# Patient Record
Sex: Female | Born: 1955 | Race: White | Hispanic: No | Marital: Married | Smoking: Never smoker
Health system: Southern US, Community
[De-identification: ages and names within clinical notes are randomized; demographics above are authoritative.]

## PROBLEM LIST (undated history)

## (undated) DIAGNOSIS — R112 Nausea with vomiting, unspecified: Secondary | ICD-10-CM

## (undated) DIAGNOSIS — G4730 Sleep apnea, unspecified: Secondary | ICD-10-CM

## (undated) DIAGNOSIS — H53469 Homonymous bilateral field defects, unspecified side: Secondary | ICD-10-CM

## (undated) DIAGNOSIS — E119 Type 2 diabetes mellitus without complications: Secondary | ICD-10-CM

## (undated) DIAGNOSIS — K219 Gastro-esophageal reflux disease without esophagitis: Secondary | ICD-10-CM

## (undated) DIAGNOSIS — M797 Fibromyalgia: Secondary | ICD-10-CM

## (undated) DIAGNOSIS — G4709 Other insomnia: Secondary | ICD-10-CM

## (undated) DIAGNOSIS — Z9889 Other specified postprocedural states: Secondary | ICD-10-CM

## (undated) DIAGNOSIS — C801 Malignant (primary) neoplasm, unspecified: Secondary | ICD-10-CM

## (undated) DIAGNOSIS — E785 Hyperlipidemia, unspecified: Secondary | ICD-10-CM

## (undated) DIAGNOSIS — R51 Headache: Secondary | ICD-10-CM

## (undated) DIAGNOSIS — J302 Other seasonal allergic rhinitis: Secondary | ICD-10-CM

## (undated) DIAGNOSIS — E039 Hypothyroidism, unspecified: Secondary | ICD-10-CM

## (undated) DIAGNOSIS — I10 Essential (primary) hypertension: Secondary | ICD-10-CM

## (undated) DIAGNOSIS — I471 Supraventricular tachycardia: Secondary | ICD-10-CM

## (undated) DIAGNOSIS — M542 Cervicalgia: Secondary | ICD-10-CM

## (undated) DIAGNOSIS — R7303 Prediabetes: Secondary | ICD-10-CM

## (undated) DIAGNOSIS — I639 Cerebral infarction, unspecified: Secondary | ICD-10-CM

## (undated) DIAGNOSIS — R002 Palpitations: Secondary | ICD-10-CM

## (undated) DIAGNOSIS — J189 Pneumonia, unspecified organism: Secondary | ICD-10-CM

## (undated) DIAGNOSIS — I69398 Other sequelae of cerebral infarction: Secondary | ICD-10-CM

## (undated) DIAGNOSIS — E559 Vitamin D deficiency, unspecified: Secondary | ICD-10-CM

## (undated) DIAGNOSIS — E063 Autoimmune thyroiditis: Secondary | ICD-10-CM

## (undated) HISTORY — DX: Autoimmune thyroiditis: E06.3

## (undated) HISTORY — PX: DILATION AND CURETTAGE OF UTERUS: SHX78

## (undated) HISTORY — DX: Cervicalgia: M54.2

## (undated) HISTORY — PX: OTHER SURGICAL HISTORY: SHX169

## (undated) HISTORY — PX: BACK SURGERY: SHX140

## (undated) HISTORY — DX: Prediabetes: R73.03

## (undated) HISTORY — DX: Homonymous bilateral field defects, unspecified side: H53.469

## (undated) HISTORY — DX: Vitamin D deficiency, unspecified: E55.9

## (undated) HISTORY — DX: Cerebral infarction, unspecified: I63.9

## (undated) HISTORY — DX: Other sequelae of cerebral infarction: I69.398

## (undated) HISTORY — DX: Palpitations: R00.2

## (undated) HISTORY — DX: Other insomnia: G47.09

## (undated) HISTORY — PX: TUBAL LIGATION: SHX77

## (undated) HISTORY — DX: Supraventricular tachycardia: I47.1

## (undated) HISTORY — PX: ABDOMINAL HYSTERECTOMY: SHX81

## (undated) HISTORY — DX: Hyperlipidemia, unspecified: E78.5

---
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
## 1988-03-04 HISTORY — PX: TUBAL LIGATION: SHX77

## 1992-03-04 LAB — HM PAP SMEAR

## 1993-03-04 HISTORY — PX: ABDOMINAL HYSTERECTOMY: SHX81

## 2003-09-07 ENCOUNTER — Other Ambulatory Visit: Admission: RE | Admit: 2003-09-07 | Discharge: 2003-09-07 | Payer: Self-pay | Admitting: Family Medicine

## 2004-03-04 HISTORY — PX: THYROIDECTOMY: SHX17

## 2004-04-16 ENCOUNTER — Ambulatory Visit: Payer: Self-pay | Admitting: Family Medicine

## 2004-11-08 ENCOUNTER — Ambulatory Visit: Payer: Self-pay | Admitting: Family Medicine

## 2005-03-05 ENCOUNTER — Ambulatory Visit: Payer: Self-pay | Admitting: Family Medicine

## 2005-03-05 ENCOUNTER — Other Ambulatory Visit: Admission: RE | Admit: 2005-03-05 | Discharge: 2005-03-05 | Payer: Self-pay | Admitting: Family Medicine

## 2005-05-02 ENCOUNTER — Ambulatory Visit: Payer: Self-pay | Admitting: Family Medicine

## 2012-07-02 HISTORY — PX: LUMBAR DISC SURGERY: SHX700

## 2012-10-03 HISTORY — PX: LUMBAR DISC SURGERY: SHX700

## 2012-10-20 ENCOUNTER — Emergency Department (HOSPITAL_COMMUNITY)
Admission: EM | Admit: 2012-10-20 | Discharge: 2012-10-20 | Disposition: A | Discharge status: Home / Self Care | Payer: BC Managed Care – PPO | Attending: Emergency Medicine | Admitting: Emergency Medicine

## 2012-10-20 ENCOUNTER — Encounter (HOSPITAL_COMMUNITY): Payer: Self-pay | Admitting: Nurse Practitioner

## 2012-10-20 ENCOUNTER — Emergency Department (HOSPITAL_COMMUNITY): Payer: BC Managed Care – PPO

## 2012-10-20 DIAGNOSIS — E039 Hypothyroidism, unspecified: Secondary | ICD-10-CM | POA: Insufficient documentation

## 2012-10-20 DIAGNOSIS — M6281 Muscle weakness (generalized): Secondary | ICD-10-CM | POA: Insufficient documentation

## 2012-10-20 DIAGNOSIS — M5416 Radiculopathy, lumbar region: Secondary | ICD-10-CM

## 2012-10-20 DIAGNOSIS — Y9289 Other specified places as the place of occurrence of the external cause: Secondary | ICD-10-CM | POA: Insufficient documentation

## 2012-10-20 DIAGNOSIS — Z9889 Other specified postprocedural states: Secondary | ICD-10-CM | POA: Insufficient documentation

## 2012-10-20 DIAGNOSIS — Z79899 Other long term (current) drug therapy: Secondary | ICD-10-CM | POA: Insufficient documentation

## 2012-10-20 DIAGNOSIS — IMO0002 Reserved for concepts with insufficient information to code with codable children: Secondary | ICD-10-CM | POA: Insufficient documentation

## 2012-10-20 DIAGNOSIS — I10 Essential (primary) hypertension: Secondary | ICD-10-CM | POA: Insufficient documentation

## 2012-10-20 DIAGNOSIS — Z791 Long term (current) use of non-steroidal anti-inflammatories (NSAID): Secondary | ICD-10-CM | POA: Insufficient documentation

## 2012-10-20 DIAGNOSIS — X500XXA Overexertion from strenuous movement or load, initial encounter: Secondary | ICD-10-CM | POA: Insufficient documentation

## 2012-10-20 DIAGNOSIS — Y9389 Activity, other specified: Secondary | ICD-10-CM | POA: Insufficient documentation

## 2012-10-20 HISTORY — DX: Essential (primary) hypertension: I10

## 2012-10-20 HISTORY — DX: Hypothyroidism, unspecified: E03.9

## 2012-10-20 MED ORDER — HYDROMORPHONE HCL PF 2 MG/ML IJ SOLN
2.0000 mg | Freq: Once | INTRAMUSCULAR | Status: AC
  Administered 2012-10-20: 2 mg via INTRAMUSCULAR
  Filled 2012-10-20: qty 1

## 2012-10-20 MED ORDER — OXYCODONE-ACETAMINOPHEN 7.5-325 MG PO TABS: 1.0000 | ORAL_TABLET | ORAL | Status: DC | PRN

## 2012-10-20 MED ORDER — ONDANSETRON 4 MG PO TBDP
8.0000 mg | ORAL_TABLET | Freq: Once | ORAL | Status: AC
  Administered 2012-10-20: 8 mg via ORAL
  Filled 2012-10-20: qty 2

## 2012-10-20 NOTE — ED Provider Notes (Signed)
Medical screening examination/treatment/procedure(s) were performed by non-physician practitioner and as supervising physician I was immediately available for consultation/collaboration.  Flint Melter, MD 10/20/12 2224

## 2012-10-20 NOTE — ED Provider Notes (Signed)
CSN: [id number]     Arrival date & time 10/20/12  1141 History     First MD Initiated Contact with Patient 10/20/12 1246     Chief Complaint  Patient presents with  . Back Pain   (Consider location/radiation/quality/duration/timing/severity/associated sxs/prior Treatment) HPI Comments: Patient has had two lumbar surgeries 07/24/12 and 10/02/12 by Dr Danielle Dess, reports two days ago she bent forward and heard a pop.  Since then has had pain worse than before her surgeries.  She has been in contact with Dr Verlee Rossetti office and per their instructions has taken her pain medications more frequently.  States she is unable to walk, has tried using a walker but it does not help. Her husband has been carrying her everywhere.  Has weakness in legs, but thinks it is pain related.  No numbness.  No fevers, chills, bowel or bladder incontiencne,  Abdominal pain, bowel, urinary, or vaginal complaints.   Patient is a 57 y.o. female presenting with back pain. The history is provided by the patient and the spouse.  Back Pain Associated symptoms: no abdominal pain, no chest pain, no dysuria and no fever     Past Medical History  Diagnosis Date  . Hypertension   . Hypothyroid    Past Surgical History  Procedure Laterality Date  . Back surgery    . Abdominal hysterectomy    . Lumbar disc surgery  10/03/2012    Dr Danielle Dess   History reviewed. No pertinent family history. History  Substance Use Topics  . Smoking status: Never Smoker   . Smokeless tobacco: Not on file  . Alcohol Use: No   OB History   Grav Para Term Preterm Abortions TAB SAB Ect Mult Living                 Review of Systems  Constitutional: Negative for fever.  Respiratory: Negative for cough and shortness of breath.   Cardiovascular: Negative for chest pain.  Gastrointestinal: Negative for nausea, vomiting, abdominal pain and diarrhea.  Genitourinary: Negative for dysuria, urgency, frequency, vaginal bleeding and vaginal discharge.    Musculoskeletal: Positive for back pain.    Allergies  Review of patient's allergies indicates no known allergies.  Home Medications   Current Outpatient Rx  Name  Route  Sig  Dispense  Refill  . cloNIDine (CATAPRES) 0.1 MG tablet   Oral   Take 0.1 mg by mouth 2 (two) times daily.         . diazepam (VALIUM) 5 MG tablet   Oral   Take 5 mg by mouth every 6 (six) hours as needed for anxiety.         Marland Kitchen levothyroxine (SYNTHROID, LEVOTHROID) 150 MCG tablet   Oral   Take 150 mcg by mouth daily before breakfast.         . meloxicam (MOBIC) 7.5 MG tablet   Oral   Take 7.5 mg by mouth 2 (two) times daily.         Marland Kitchen oxyCODONE-acetaminophen (PERCOCET/ROXICET) 5-325 MG per tablet   Oral   Take 1-2 tablets by mouth every 4 (four) hours as needed for pain.         . promethazine (PHENERGAN) 25 MG tablet   Oral   Take 25 mg by mouth every 6 (six) hours as needed for nausea.          BP 133/96  Pulse 97  Temp(Src) 97.9 F (36.6 C) (Oral)  Resp 20  SpO2 96% Physical Exam  Nursing  note and vitals reviewed. Constitutional: She appears well-developed and well-nourished. No distress.  HENT:  Head: Normocephalic and atraumatic.  Neck: Neck supple.  Pulmonary/Chest: Effort normal.  Abdominal: Soft. Bowel sounds are normal. She exhibits no distension. There is no tenderness. There is no rebound and no guarding.  Musculoskeletal:       Back:  Lower extremities:  Strength 5/5 on right, 4/5 on left, sensation intact, distal pulses intact.     Neurological: She is alert.  Skin: She is not diaphoretic.    ED Course   Procedures (including critical care time)  Labs Reviewed - No data to display Mr Lumbar Spine Wo Contrast  10/20/2012   CLINICAL DATA:  57 year old female with acute onset severe low back pain. Two prior back surgeries. Unable to walk.  EXAM: MRI LUMBAR SPINE WITHOUT CONTRAST  TECHNIQUE: Multiplanar, multisequence MR imaging was performed. No intravenous  contrast was administered.  COMPARISON:  [facility] lumbar MRI 09/03/2012, 06/04/2012, and [facility] lumbar MRI 03/14/2012.  FINDINGS: Same numbering system as on 09/03/2012.  Stable vertebral height and alignment. No marrow edema or evidence of acute osseous abnormality.  Postoperative changes to the paraspinal soft tissues most pronounced on the left at the L2-L3 level. See additional postoperative details below. Negative visualized abdominal visceral.  Visualized lower thoracic spinal cord is normal with conus medularis at L1-L2.  T11-12: Negative.  T12-L1: Negative.  L1-L2: Mild to moderate circumferential disk bulge has not significantly changed. Mild spinal stenosis at this level has not significantly changed. No foraminal stenosis.  L2-L3: Interval postoperative changes on the left with increased laminectomy. There is a T2 hyperintense probable fluid collection at the laminectomy site which does exert mild mass effect on the thecal sac, and along the course of the descending left L3 nerve roots (series 6, image 12). Partial discectomy at this site, the left paracentral disc extrusion is no longer visible. Underlying circumferential disk bulge again noted. Mild right facet and ligament flavum hypertrophy. Trace right facet joint fluid. Overall thecal sac patency at this level is mildly improved. Left lateral recess patency also improved. No definite foraminal stenosis.  L3-L4: Stable circumferential disk bulge with broad-based posterior component that interval decreased central annular tear high signal. Mild facet and ligament flavum hypertrophy is stable. Mild to moderate spinal stenosis not significantly changed. No foraminal stenosis. Trace facet joint fluid.  L4-L5: Left eccentric disc bulge is stable. Moderate facet and ligament flavum hypertrophy is stable. Stable mild left lateral recess stenosis without significant spinal stenosis. Trace fluid in both facet joints this level.  Borderline to mild left L4 foraminal stenosis is stable.  L5-S1: Stable and negative.  IMPRESSION: 1. Interval postoperative changes on the left at L2-L3. No residual or recurrent disk herniation. There is a small fluid collection in the laminectomy space which exerts mild mass effect on the course of the descending left L3 nerve roots, but left lateral recess and overall thecal sac patency here appears improved.  2. No significant progression of other lumbar levels, with mild to moderate multifactorial spinal stenosis at L1-L2 and L3-L4. Annular tear at L3-L4 appears regressed. Stable mild left lateral recess and left foraminal stenosis at L4-L5.   Electronically Signed   By: Augusto Gamble   On: 10/20/2012 15:49    4:17 PM Discussed patient with Dr Effie Shy, who also reviewed pt's MRI.  Patient ambulated after pain medication without difficulty (rated back pain 3/10 at the time), she is able to move left leg easily now  with pain control.   4:50 PM Discussed patient with Dr Jordan Likes who has reviewed MRI.  Dr Jordan Likes states these are normal postoperative changes and he agrees with increasing pain meds and d/c home with Dr Danielle Dess follow up.   1. Pain, radicular, lumbar     MDM  Pt with chronic low back pain with two recent surgeries by Dr Danielle Dess, p/w new injury to low back with slight flexion of spine two days ago.  Pain uncontrolled, unable to walk at home.  Pain controlled here and patient able to ambulate in hallway, able to move left leg more easily.  MRI ordered given initial presentation and recent surgery.  Per Dr Jordan Likes, fluid collection appears to be normal postoperative change.  She is afebrile, nontoxic, much more comfortable after meds.  Pt d/c home with neurosurgery ([facility]) follow up, increasing her home pain meds.  Discussed all results with patient.  Pt given return precautions.  Patient is currently sleepy from pain medication, husband is beside and all results and plan discussed with him.  He expresses  understanding and agrees with plan.     Trixie Dredge, PA-C 10/20/12 1722

## 2012-10-20 NOTE — ED Notes (Addendum)
Pt states she leaned over Sunday and felt a "pop" in her lower back, shes been having severe lower back pain since. Pt had 2 back surgeries by dr elsner this summer. Pt states she can not walk due to pain. Denies bowel/bladder changes. Took percocet and diazepam at 1000 today with no relief

## 2013-01-08 ENCOUNTER — Other Ambulatory Visit: Payer: Self-pay | Admitting: Neurological Surgery

## 2013-01-08 ENCOUNTER — Other Ambulatory Visit (HOSPITAL_COMMUNITY): Payer: Self-pay | Admitting: Neurological Surgery

## 2013-01-08 DIAGNOSIS — IMO0002 Reserved for concepts with insufficient information to code with codable children: Secondary | ICD-10-CM

## 2013-01-14 ENCOUNTER — Encounter (HOSPITAL_COMMUNITY): Payer: Self-pay | Admitting: Pharmacy Technician

## 2013-01-18 ENCOUNTER — Encounter (HOSPITAL_COMMUNITY): Payer: Self-pay

## 2013-01-18 ENCOUNTER — Ambulatory Visit (HOSPITAL_COMMUNITY)
Admission: RE | Admit: 2013-01-18 | Discharge: 2013-01-18 | Disposition: A | Discharge status: Home / Self Care | Payer: BC Managed Care – PPO | Source: Ambulatory Visit | Attending: Neurological Surgery | Admitting: Neurological Surgery

## 2013-01-18 DIAGNOSIS — M5146 Schmorl's nodes, lumbar region: Secondary | ICD-10-CM | POA: Insufficient documentation

## 2013-01-18 DIAGNOSIS — M5126 Other intervertebral disc displacement, lumbar region: Secondary | ICD-10-CM | POA: Insufficient documentation

## 2013-01-18 DIAGNOSIS — IMO0002 Reserved for concepts with insufficient information to code with codable children: Secondary | ICD-10-CM

## 2013-01-18 DIAGNOSIS — M47817 Spondylosis without myelopathy or radiculopathy, lumbosacral region: Secondary | ICD-10-CM | POA: Insufficient documentation

## 2013-01-18 LAB — GLUCOSE, CAPILLARY
Glucose-Capillary: 119 mg/dL — ABNORMAL HIGH (ref 70–99)
Glucose-Capillary: 88 mg/dL (ref 70–99)

## 2013-01-18 MED ORDER — ONDANSETRON HCL 4 MG/2ML IJ SOLN: 4.0000 mg | Freq: Four times a day (QID) | INTRAMUSCULAR | Status: DC | PRN

## 2013-01-18 MED ORDER — DIAZEPAM 5 MG PO TABS
10.0000 mg | ORAL_TABLET | Freq: Once | ORAL | Status: AC
  Administered 2013-01-18: 10 mg via ORAL

## 2013-01-18 MED ORDER — DIAZEPAM 5 MG PO TABS
ORAL_TABLET | ORAL | Status: AC
  Filled 2013-01-18: qty 2

## 2013-01-18 MED ORDER — HYDROCODONE-ACETAMINOPHEN 5-325 MG PO TABS
1.0000 | ORAL_TABLET | ORAL | Status: DC | PRN
  Administered 2013-01-18: 1 via ORAL
  Filled 2013-01-18: qty 1

## 2013-01-18 MED ORDER — IOHEXOL 180 MG/ML  SOLN
20.0000 mL | Freq: Once | INTRAMUSCULAR | Status: AC | PRN
  Administered 2013-01-18: 12 mL via INTRATHECAL

## 2013-01-18 NOTE — Procedures (Addendum)
Caitlin Ho is a 57 year old individual is had 2 previous discectomies at L3-L4 these were done this past year. She has persistent left lumbar radiculopathy. She has evidence of some advanced spondylosis at the level of L3 L4-1 myelogram is now being performed to see if there is any gross instability or other pathology that can be responsible for her continued and persistent radicular symptoms.  Pre op Dx: Lumbar radiculopathy status post discectomy L3-4 left Post op Dx: Lumbar radiculopathy status post discectomy L3-4 left Procedure: Lumbar myelogram with standing flexion-extension views Surgeon: Bernadette Gores Puncture level: L2-3 Fluid color: Clear colorless Injection: Iohexol 180, 11 cc Findings: Mild spondylosis L3-4. For standing flexion-extension views plus CT scan of lumbar spine.

## 2013-01-22 ENCOUNTER — Other Ambulatory Visit: Payer: Self-pay | Admitting: Neurological Surgery

## 2013-02-09 ENCOUNTER — Encounter (HOSPITAL_COMMUNITY): Payer: Self-pay | Admitting: Pharmacy Technician

## 2013-02-11 ENCOUNTER — Encounter (HOSPITAL_COMMUNITY)
Admission: RE | Admit: 2013-02-11 | Discharge: 2013-02-11 | Disposition: A | Discharge status: Home / Self Care | Payer: BC Managed Care – PPO | Source: Ambulatory Visit | Attending: Neurological Surgery | Admitting: Neurological Surgery

## 2013-02-11 ENCOUNTER — Encounter (HOSPITAL_COMMUNITY): Payer: Self-pay

## 2013-02-11 ENCOUNTER — Ambulatory Visit (HOSPITAL_COMMUNITY)
Admission: RE | Admit: 2013-02-11 | Discharge: 2013-02-11 | Disposition: A | Discharge status: Home / Self Care | Payer: BC Managed Care – PPO | Source: Ambulatory Visit | Attending: Anesthesiology | Admitting: Anesthesiology

## 2013-02-11 DIAGNOSIS — I10 Essential (primary) hypertension: Secondary | ICD-10-CM | POA: Insufficient documentation

## 2013-02-11 DIAGNOSIS — Z01818 Encounter for other preprocedural examination: Secondary | ICD-10-CM | POA: Insufficient documentation

## 2013-02-11 DIAGNOSIS — Z01812 Encounter for preprocedural laboratory examination: Secondary | ICD-10-CM | POA: Insufficient documentation

## 2013-02-11 HISTORY — DX: Headache: R51

## 2013-02-11 HISTORY — DX: Other specified postprocedural states: Z98.890

## 2013-02-11 HISTORY — DX: Malignant (primary) neoplasm, unspecified: C80.1

## 2013-02-11 HISTORY — DX: Gastro-esophageal reflux disease without esophagitis: K21.9

## 2013-02-11 HISTORY — DX: Nausea with vomiting, unspecified: R11.2

## 2013-02-11 HISTORY — DX: Other specified postprocedural states: R11.2

## 2013-02-11 HISTORY — DX: Type 2 diabetes mellitus without complications: E11.9

## 2013-02-11 LAB — TYPE AND SCREEN
ABO/RH(D): O POS
Antibody Screen: NEGATIVE

## 2013-02-11 LAB — CBC
MCHC: 34.1 g/dL (ref 30.0–36.0)
MCV: 90.7 fL (ref 78.0–100.0)
RDW: 13 % (ref 11.5–15.5)

## 2013-02-11 LAB — SURGICAL PCR SCREEN: Staphylococcus aureus: NEGATIVE

## 2013-02-11 LAB — ABO/RH: ABO/RH(D): O POS

## 2013-02-11 LAB — BASIC METABOLIC PANEL
BUN: 11 mg/dL (ref 6–23)
Chloride: 100 mEq/L (ref 96–112)
Creatinine, Ser: 0.79 mg/dL (ref 0.50–1.10)
GFR calc Af Amer: >90 mL/min (ref >90)
GFR calc non Af Amer: >90 mL/min (ref >90)
Sodium: 140 mEq/L (ref 135–145)

## 2013-02-11 NOTE — Pre-Procedure Instructions (Signed)
[name]  02/11/2013   Your procedure is scheduled on:  Tuesday December 16 th at 1117 AM  Report to [facility] Main Entrance "A" at [phone] AM.  Call this number if you have problems the morning of surgery: [phone]   Remember:   Do not eat food or drink liquids after midnight.   Take these medicines the morning of surgery with A SIP OF WATER: clonidine CATAPRES),  Hydrocodone-acetaminophen if needed for pain , and Synthroid   Stop Aspirin, Vitamins, herbal medication and NSAID's 7 days prior to surgery.   Do not wear jewelry, make-up or nail polish.  Do not wear lotions, powders, or perfumes. You may wear deodorant.  Do not shave 48 hours prior to surgery.   Do not bring valuables to the hospital.  [facility] is not responsible for any belongings or valuables.               Contacts, dentures or bridgework may not be worn into surgery.  Leave suitcase in the car. After surgery it may be brought to your room.  For patients admitted to the hospital, discharge time is determined by your treatment team.               Patients discharged the day of surgery will not be allowed to drive home.    Special Instructions: Shower using CHG 2 nights before surgery and the night before surgery.  If you shower the day of surgery use CHG.  Use special wash - you have one bottle of CHG for all showers.  You should use approximately 1/3 of the bottle for each shower.   Please read over the following fact sheets that you were given: Pain Booklet, Coughing and Deep Breathing, Blood Transfusion Information, MRSA Information and Surgical Site Infection Prevention

## 2013-02-11 NOTE — Progress Notes (Signed)
Stop-Bang tool for sleep apnea was faxed to patient's PCP Dr Egbert Garibaldi at [facility] in [place] ,[place]

## 2013-02-15 MED ORDER — CEFAZOLIN SODIUM-DEXTROSE 2-3 GM-% IV SOLR
2.0000 g | INTRAVENOUS | Status: AC
  Administered 2013-02-16: 2 g via INTRAVENOUS
  Filled 2013-02-15: qty 50

## 2013-02-15 NOTE — Progress Notes (Signed)
Anesthesia Chart Review:  57 year old female scheduled for L3-4 PLIF on 02/16/13 by Dr. Danielle Dess.  History includes obesity, non-smoker, post-operative N/V, HTN, thyroidectomy '06 with secondary hypothyroidism, DM2, GERD, headaches, cervical cancer s/p hysterectomy, appendectomy, prior lumbar surgeries 07/24/12 (L2-3 diskectomy) and 10/02/12 (L2-3 redo microdiskectomy). PCP is listed as Dr. Cheri Rous.  She had significant N/V following her first surgery this year, so in August 2014 she was given a medications "usually given to cancer patients" and did much better.  I assume this is Zofran.  She is trying to get records.  If not, we may be able to get them tomorrow after she signs a release of medical information.  EKG on 02/11/13 showed NSR, ST/T wave abnormality, consider lateral ischemia.  (There is baseline wandering, so findings may be somewhat non-specific.) There are no prior EKGs at Dr. Roselie Awkward office, and he is out of the office today. [facility] called earlier this morning and asked to fax prior EKG if done--although they will not typically fax records without a signed release.  I called patient.  She denies chest pain, SOB, prior cardiac history or testing. Her activity has been very limited over the past year.  She is having to use a walker most recently.  Preoperative CXR and labs noted.  Currently, no comparison EKG received.  She denies chest pain and SOB. I reviewed history and EKG with anesthesiologist Dr. Krista Blue.  She is asymptomatic from a CV standpoint and has tolerated two surgeries earlier this year.  She will be further evaluated on the day of surgery by her assigned anesthesiologist, but if no acute changes it is anticipated that she can proceed as planned.    Velna Ochs [facility] Short Stay Center/Anesthesiology Phone [phone] 02/15/2013 12:15 PM

## 2013-02-16 ENCOUNTER — Encounter (HOSPITAL_COMMUNITY): Payer: Self-pay | Admitting: Surgery

## 2013-02-16 ENCOUNTER — Inpatient Hospital Stay (HOSPITAL_COMMUNITY)
Admission: RE | Admit: 2013-02-16 | Discharge: 2013-02-20 | DRG: 460 | Disposition: A | Discharge status: Home / Self Care | Payer: BC Managed Care – PPO | Source: Ambulatory Visit | Attending: Neurological Surgery | Admitting: Neurological Surgery

## 2013-02-16 ENCOUNTER — Inpatient Hospital Stay (HOSPITAL_COMMUNITY): Payer: BC Managed Care – PPO | Admitting: Anesthesiology

## 2013-02-16 ENCOUNTER — Encounter (HOSPITAL_COMMUNITY)
Admission: RE | Disposition: A | Discharge status: Home / Self Care | Payer: BC Managed Care – PPO | Source: Ambulatory Visit | Attending: Neurological Surgery

## 2013-02-16 ENCOUNTER — Inpatient Hospital Stay (HOSPITAL_COMMUNITY): Payer: BC Managed Care – PPO

## 2013-02-16 ENCOUNTER — Encounter (HOSPITAL_COMMUNITY): Payer: BC Managed Care – PPO | Admitting: Vascular Surgery

## 2013-02-16 DIAGNOSIS — Z79899 Other long term (current) drug therapy: Secondary | ICD-10-CM

## 2013-02-16 DIAGNOSIS — E039 Hypothyroidism, unspecified: Secondary | ICD-10-CM | POA: Diagnosis present

## 2013-02-16 DIAGNOSIS — I10 Essential (primary) hypertension: Secondary | ICD-10-CM | POA: Diagnosis present

## 2013-02-16 DIAGNOSIS — K219 Gastro-esophageal reflux disease without esophagitis: Secondary | ICD-10-CM | POA: Diagnosis present

## 2013-02-16 DIAGNOSIS — M48061 Spinal stenosis, lumbar region without neurogenic claudication: Secondary | ICD-10-CM | POA: Diagnosis present

## 2013-02-16 DIAGNOSIS — M479 Spondylosis, unspecified: Principal | ICD-10-CM | POA: Diagnosis present

## 2013-02-16 DIAGNOSIS — I9589 Other hypotension: Secondary | ICD-10-CM | POA: Diagnosis not present

## 2013-02-16 DIAGNOSIS — E119 Type 2 diabetes mellitus without complications: Secondary | ICD-10-CM | POA: Diagnosis present

## 2013-02-16 DIAGNOSIS — Z8541 Personal history of malignant neoplasm of cervix uteri: Secondary | ICD-10-CM

## 2013-02-16 HISTORY — PX: LUMBAR FUSION: SHX111

## 2013-02-16 HISTORY — PX: BACK SURGERY: SHX140

## 2013-02-16 HISTORY — DX: Spinal stenosis, lumbar region without neurogenic claudication: M48.061

## 2013-02-16 LAB — GLUCOSE, CAPILLARY
Glucose-Capillary: 99 mg/dL (ref 70–99)
Glucose-Capillary: 189 mg/dL — ABNORMAL HIGH (ref 70–99)

## 2013-02-16 SURGERY — POSTERIOR LUMBAR FUSION 1 LEVEL
Anesthesia: General | Site: Spine Lumbar

## 2013-02-16 MED ORDER — METHOCARBAMOL 500 MG PO TABS
500.0000 mg | ORAL_TABLET | Freq: Four times a day (QID) | ORAL | Status: DC | PRN
  Administered 2013-02-18 – 2013-02-20 (×6): 500 mg via ORAL
  Filled 2013-02-16 (×6): qty 1

## 2013-02-16 MED ORDER — DOCUSATE SODIUM 100 MG PO CAPS
100.0000 mg | ORAL_CAPSULE | Freq: Two times a day (BID) | ORAL | Status: DC
  Administered 2013-02-16 – 2013-02-19 (×7): 100 mg via ORAL
  Filled 2013-02-16 (×5): qty 1

## 2013-02-16 MED ORDER — OXYCODONE HCL 5 MG PO TABS: 5.0000 mg | ORAL_TABLET | Freq: Once | ORAL | Status: DC | PRN

## 2013-02-16 MED ORDER — DEXTROSE 5 % IV SOLN
500.0000 mg | Freq: Four times a day (QID) | INTRAVENOUS | Status: DC | PRN
  Filled 2013-02-16: qty 5

## 2013-02-16 MED ORDER — SODIUM CHLORIDE 0.9 % IJ SOLN: 3.0000 mL | INTRAMUSCULAR | Status: DC | PRN

## 2013-02-16 MED ORDER — ACETAMINOPHEN 650 MG RE SUPP: 650.0000 mg | RECTAL | Status: DC | PRN

## 2013-02-16 MED ORDER — BUPIVACAINE HCL (PF) 0.5 % IJ SOLN
INTRAMUSCULAR | Status: DC | PRN
  Administered 2013-02-16: 4 mL

## 2013-02-16 MED ORDER — OXYCODONE HCL 5 MG/5ML PO SOLN: 5.0000 mg | Freq: Once | ORAL | Status: DC | PRN

## 2013-02-16 MED ORDER — VECURONIUM BROMIDE 10 MG IV SOLR
INTRAVENOUS | Status: DC | PRN
  Administered 2013-02-16: 1 mg via INTRAVENOUS
  Administered 2013-02-16: 3 mg via INTRAVENOUS
  Administered 2013-02-16 (×2): 2 mg via INTRAVENOUS

## 2013-02-16 MED ORDER — DEXAMETHASONE SODIUM PHOSPHATE 4 MG/ML IJ SOLN
INTRAMUSCULAR | Status: DC | PRN
  Administered 2013-02-16: 10 mg via INTRAVENOUS

## 2013-02-16 MED ORDER — LIDOCAINE HCL (CARDIAC) 20 MG/ML IV SOLN
INTRAVENOUS | Status: DC | PRN
  Administered 2013-02-16: 100 mg via INTRAVENOUS

## 2013-02-16 MED ORDER — PROMETHAZINE HCL 25 MG/ML IJ SOLN: 6.2500 mg | INTRAMUSCULAR | Status: DC | PRN

## 2013-02-16 MED ORDER — 0.9 % SODIUM CHLORIDE (POUR BTL) OPTIME
TOPICAL | Status: DC | PRN
  Administered 2013-02-16: 1000 mL

## 2013-02-16 MED ORDER — METFORMIN HCL ER 500 MG PO TB24
500.0000 mg | ORAL_TABLET | Freq: Every day | ORAL | Status: DC
  Administered 2013-02-17 – 2013-02-20 (×4): 500 mg via ORAL
  Filled 2013-02-16 (×5): qty 1

## 2013-02-16 MED ORDER — NALOXONE HCL 0.4 MG/ML IJ SOLN
INTRAMUSCULAR | Status: DC | PRN
  Administered 2013-02-16: 0.1 mg via INTRAVENOUS

## 2013-02-16 MED ORDER — BISACODYL 10 MG RE SUPP: 10.0000 mg | Freq: Every day | RECTAL | Status: DC | PRN

## 2013-02-16 MED ORDER — LIDOCAINE-EPINEPHRINE 1 %-1:100000 IJ SOLN
INTRAMUSCULAR | Status: DC | PRN
  Administered 2013-02-16: 4 mL

## 2013-02-16 MED ORDER — THROMBIN 20000 UNITS EX SOLR
CUTANEOUS | Status: DC | PRN
  Administered 2013-02-16: 14:00:00 via TOPICAL

## 2013-02-16 MED ORDER — ARTIFICIAL TEARS OP OINT
TOPICAL_OINTMENT | OPHTHALMIC | Status: DC | PRN
  Administered 2013-02-16: 1 via OPHTHALMIC

## 2013-02-16 MED ORDER — FLEET ENEMA 7-19 GM/118ML RE ENEM: 1.0000 | ENEMA | Freq: Once | RECTAL | Status: AC | PRN

## 2013-02-16 MED ORDER — LISINOPRIL 5 MG PO TABS
5.0000 mg | ORAL_TABLET | Freq: Every day | ORAL | Status: DC
  Administered 2013-02-16 – 2013-02-19 (×3): 5 mg via ORAL
  Filled 2013-02-16 (×5): qty 1

## 2013-02-16 MED ORDER — POLYETHYLENE GLYCOL 3350 17 G PO PACK
17.0000 g | PACK | Freq: Every day | ORAL | Status: DC | PRN
  Administered 2013-02-19: 17 g via ORAL
  Filled 2013-02-16: qty 1

## 2013-02-16 MED ORDER — MIDAZOLAM HCL 5 MG/5ML IJ SOLN
INTRAMUSCULAR | Status: DC | PRN
  Administered 2013-02-16: 2 mg via INTRAVENOUS

## 2013-02-16 MED ORDER — SENNA 8.6 MG PO TABS
1.0000 | ORAL_TABLET | Freq: Two times a day (BID) | ORAL | Status: DC
  Administered 2013-02-17 – 2013-02-19 (×6): 8.6 mg via ORAL
  Filled 2013-02-16 (×11): qty 1

## 2013-02-16 MED ORDER — SODIUM CHLORIDE 0.9 % IR SOLN
Status: DC | PRN
  Administered 2013-02-16: 14:00:00

## 2013-02-16 MED ORDER — FENTANYL CITRATE 0.05 MG/ML IJ SOLN
INTRAMUSCULAR | Status: DC | PRN
  Administered 2013-02-16: 100 ug via INTRAVENOUS
  Administered 2013-02-16: 250 ug via INTRAVENOUS
  Administered 2013-02-16: 150 ug via INTRAVENOUS

## 2013-02-16 MED ORDER — MENTHOL 3 MG MT LOZG: 1.0000 | LOZENGE | OROMUCOSAL | Status: DC | PRN

## 2013-02-16 MED ORDER — NEOSTIGMINE METHYLSULFATE 1 MG/ML IJ SOLN
INTRAMUSCULAR | Status: DC | PRN
  Administered 2013-02-16: 4 mg via INTRAVENOUS

## 2013-02-16 MED ORDER — OXYCODONE-ACETAMINOPHEN 5-325 MG PO TABS
1.0000 | ORAL_TABLET | ORAL | Status: DC | PRN
  Administered 2013-02-17 – 2013-02-18 (×2): 2 via ORAL
  Administered 2013-02-18 (×2): 1 via ORAL
  Administered 2013-02-18 – 2013-02-20 (×6): 2 via ORAL
  Filled 2013-02-16: qty 2
  Filled 2013-02-16: qty 1
  Filled 2013-02-16 (×3): qty 2
  Filled 2013-02-16: qty 1
  Filled 2013-02-16 (×4): qty 2

## 2013-02-16 MED ORDER — ROCURONIUM BROMIDE 100 MG/10ML IV SOLN
INTRAVENOUS | Status: DC | PRN
  Administered 2013-02-16: 50 mg via INTRAVENOUS

## 2013-02-16 MED ORDER — SODIUM CHLORIDE 0.9 % IV SOLN: 250.0000 mL | INTRAVENOUS | Status: DC

## 2013-02-16 MED ORDER — PHENOL 1.4 % MT LIQD: 1.0000 | OROMUCOSAL | Status: DC | PRN

## 2013-02-16 MED ORDER — LACTATED RINGERS IV SOLN
INTRAVENOUS | Status: DC
  Administered 2013-02-16 (×2): via INTRAVENOUS

## 2013-02-16 MED ORDER — DEXAMETHASONE SODIUM PHOSPHATE 4 MG/ML IJ SOLN: INTRAMUSCULAR | Status: DC | PRN

## 2013-02-16 MED ORDER — ONDANSETRON HCL 4 MG/2ML IJ SOLN: 4.0000 mg | INTRAMUSCULAR | Status: DC | PRN

## 2013-02-16 MED ORDER — SIMVASTATIN 5 MG PO TABS
5.0000 mg | ORAL_TABLET | Freq: Every day | ORAL | Status: DC
  Administered 2013-02-16 – 2013-02-19 (×4): 5 mg via ORAL
  Filled 2013-02-16 (×5): qty 1

## 2013-02-16 MED ORDER — SODIUM CHLORIDE 0.9 % IJ SOLN
3.0000 mL | Freq: Two times a day (BID) | INTRAMUSCULAR | Status: DC
  Administered 2013-02-17 – 2013-02-19 (×5): 3 mL via INTRAVENOUS

## 2013-02-16 MED ORDER — PROPOFOL 10 MG/ML IV BOLUS
INTRAVENOUS | Status: DC | PRN
  Administered 2013-02-16: 200 mg via INTRAVENOUS

## 2013-02-16 MED ORDER — ONDANSETRON HCL 4 MG/2ML IJ SOLN
INTRAMUSCULAR | Status: DC | PRN
  Administered 2013-02-16 (×2): 4 mg via INTRAVENOUS

## 2013-02-16 MED ORDER — KETOROLAC TROMETHAMINE 15 MG/ML IJ SOLN
15.0000 mg | Freq: Four times a day (QID) | INTRAMUSCULAR | Status: AC
  Administered 2013-02-16 – 2013-02-17 (×5): 15 mg via INTRAVENOUS
  Filled 2013-02-16 (×5): qty 1

## 2013-02-16 MED ORDER — ALUM & MAG HYDROXIDE-SIMETH 200-200-20 MG/5ML PO SUSP
30.0000 mL | Freq: Four times a day (QID) | ORAL | Status: DC | PRN
  Administered 2013-02-17 – 2013-02-19 (×2): 30 mL via ORAL
  Filled 2013-02-16 (×2): qty 30

## 2013-02-16 MED ORDER — SODIUM CHLORIDE 0.9 % IV SOLN
INTRAVENOUS | Status: DC
  Administered 2013-02-16 – 2013-02-17 (×2): via INTRAVENOUS

## 2013-02-16 MED ORDER — MORPHINE SULFATE 2 MG/ML IJ SOLN
1.0000 mg | INTRAMUSCULAR | Status: DC | PRN
  Administered 2013-02-18 (×2): 2 mg via INTRAVENOUS
  Filled 2013-02-16 (×2): qty 1

## 2013-02-16 MED ORDER — CLONIDINE HCL 0.2 MG PO TABS
0.2000 mg | ORAL_TABLET | Freq: Every day | ORAL | Status: DC
  Administered 2013-02-16 – 2013-02-19 (×2): 0.2 mg via ORAL
  Filled 2013-02-16 (×5): qty 1

## 2013-02-16 MED ORDER — LEVOTHYROXINE SODIUM 175 MCG PO TABS
175.0000 ug | ORAL_TABLET | Freq: Every day | ORAL | Status: DC
  Administered 2013-02-17 – 2013-02-20 (×4): 175 ug via ORAL
  Filled 2013-02-16 (×5): qty 1

## 2013-02-16 MED ORDER — HYDROMORPHONE HCL PF 1 MG/ML IJ SOLN: 0.2500 mg | INTRAMUSCULAR | Status: DC | PRN

## 2013-02-16 MED ORDER — GLYCOPYRROLATE 0.2 MG/ML IJ SOLN
INTRAMUSCULAR | Status: DC | PRN
  Administered 2013-02-16: 0.6 mg via INTRAVENOUS

## 2013-02-16 MED ORDER — ACETAMINOPHEN 325 MG PO TABS
650.0000 mg | ORAL_TABLET | ORAL | Status: DC | PRN
  Administered 2013-02-17 – 2013-02-18 (×2): 650 mg via ORAL
  Filled 2013-02-16 (×2): qty 2

## 2013-02-16 SURGICAL SUPPLY — 66 items
ADH SKN CLS APL DERMABOND .7 (GAUZE/BANDAGES/DRESSINGS) ×1
BAG DECANTER FOR FLEXI CONT (MISCELLANEOUS) ×2 IMPLANT
BLADE SURG ROTATE 9660 (MISCELLANEOUS) IMPLANT
BUR MATCHSTICK NEURO 3.0 LAGG (BURR) ×2 IMPLANT
CAGE 13MM (Cage) ×4 IMPLANT
CANISTER SUCT 3000ML (MISCELLANEOUS) ×2 IMPLANT
CONT SPEC 4OZ CLIKSEAL STRL BL (MISCELLANEOUS) ×4 IMPLANT
COVER BACK TABLE 24X17X13 BIG (DRAPES) IMPLANT
COVER TABLE BACK 60X90 (DRAPES) ×2 IMPLANT
DECANTER SPIKE VIAL GLASS SM (MISCELLANEOUS) ×2 IMPLANT
DERMABOND ADVANCED (GAUZE/BANDAGES/DRESSINGS) ×1
DERMABOND ADVANCED .7 DNX12 (GAUZE/BANDAGES/DRESSINGS) ×1 IMPLANT
DRAPE C-ARM 42X72 X-RAY (DRAPES) ×4 IMPLANT
DRAPE LAPAROTOMY 100X72X124 (DRAPES) ×2 IMPLANT
DRAPE POUCH INSTRU U-SHP 10X18 (DRAPES) ×2 IMPLANT
DRAPE PROXIMA HALF (DRAPES) IMPLANT
DRSG OPSITE POSTOP 4X8 (GAUZE/BANDAGES/DRESSINGS) ×2 IMPLANT
DURAPREP 26ML APPLICATOR (WOUND CARE) ×2 IMPLANT
ELECT REM PT RETURN 9FT ADLT (ELECTROSURGICAL) ×2
ELECTRODE REM PT RTRN 9FT ADLT (ELECTROSURGICAL) ×1 IMPLANT
GAUZE SPONGE 4X4 16PLY XRAY LF (GAUZE/BANDAGES/DRESSINGS) IMPLANT
GLOVE BIOGEL PI IND STRL 7.0 (GLOVE) ×1 IMPLANT
GLOVE BIOGEL PI IND STRL 7.5 (GLOVE) ×4 IMPLANT
GLOVE BIOGEL PI IND STRL 8.5 (GLOVE) ×2 IMPLANT
GLOVE BIOGEL PI INDICATOR 7.0 (GLOVE) ×1
GLOVE BIOGEL PI INDICATOR 7.5 (GLOVE) ×4
GLOVE BIOGEL PI INDICATOR 8.5 (GLOVE) ×2
GLOVE ECLIPSE 7.5 STRL STRAW (GLOVE) ×8 IMPLANT
GLOVE ECLIPSE 8.5 STRL (GLOVE) ×4 IMPLANT
GLOVE EXAM NITRILE LRG STRL (GLOVE) IMPLANT
GLOVE EXAM NITRILE MD LF STRL (GLOVE) IMPLANT
GLOVE EXAM NITRILE XL STR (GLOVE) IMPLANT
GLOVE EXAM NITRILE XS STR PU (GLOVE) IMPLANT
GLOVE SURG SS PI 7.0 STRL IVOR (GLOVE) ×2 IMPLANT
GOWN BRE IMP SLV AUR LG STRL (GOWN DISPOSABLE) ×2 IMPLANT
GOWN BRE IMP SLV AUR XL STRL (GOWN DISPOSABLE) ×8 IMPLANT
GOWN STRL REIN 2XL LVL4 (GOWN DISPOSABLE) ×4 IMPLANT
HEMOSTAT POWDER KIT SURGIFOAM (HEMOSTASIS) IMPLANT
KIT BASIN OR (CUSTOM PROCEDURE TRAY) ×2 IMPLANT
KIT ROOM TURNOVER OR (KITS) ×2 IMPLANT
MILL MEDIUM DISP (BLADE) ×2 IMPLANT
NEEDLE HYPO 22GX1.5 SAFETY (NEEDLE) ×2 IMPLANT
NS IRRIG 1000ML POUR BTL (IV SOLUTION) ×2 IMPLANT
PACK FOAM VITOSS 10CC (Orthopedic Implant) ×2 IMPLANT
PACK LAMINECTOMY NEURO (CUSTOM PROCEDURE TRAY) ×2 IMPLANT
PAD ARMBOARD 7.5X6 YLW CONV (MISCELLANEOUS) ×10 IMPLANT
PATTIES SURGICAL .5 X.5 (GAUZE/BANDAGES/DRESSINGS) ×2 IMPLANT
PATTIES SURGICAL .5 X1 (DISPOSABLE) IMPLANT
ROD TI 5.5MM 5CM (Rod) ×4 IMPLANT
SCREW 40MM (Screw) ×8 IMPLANT
SCREW SET SPINAL STD HEXALOBE (Screw) ×8 IMPLANT
SPONGE GAUZE 4X4 12PLY (GAUZE/BANDAGES/DRESSINGS) IMPLANT
SPONGE LAP 4X18 X RAY DECT (DISPOSABLE) IMPLANT
SPONGE SURGIFOAM ABS GEL 100 (HEMOSTASIS) ×2 IMPLANT
SUT PROLENE 6 0 BV (SUTURE) ×4 IMPLANT
SUT VIC AB 1 CT1 18XBRD ANBCTR (SUTURE) ×1 IMPLANT
SUT VIC AB 1 CT1 8-18 (SUTURE) ×2
SUT VIC AB 2-0 CP2 18 (SUTURE) ×2 IMPLANT
SUT VIC AB 3-0 SH 8-18 (SUTURE) ×4 IMPLANT
SYR 20ML ECCENTRIC (SYRINGE) ×2 IMPLANT
SYR 3ML LL SCALE MARK (SYRINGE) ×8 IMPLANT
TOWEL OR 17X24 6PK STRL BLUE (TOWEL DISPOSABLE) ×2 IMPLANT
TOWEL OR 17X26 10 PK STRL BLUE (TOWEL DISPOSABLE) ×2 IMPLANT
TRAP SPECIMEN MUCOUS 40CC (MISCELLANEOUS) ×2 IMPLANT
TRAY FOLEY CATH 14FRSI W/METER (CATHETERS) ×2 IMPLANT
WATER STERILE IRR 1000ML POUR (IV SOLUTION) ×2 IMPLANT

## 2013-02-16 NOTE — Anesthesia Postprocedure Evaluation (Signed)
  Anesthesia Post-op Note  Patient: Caitlin Ho  Procedure(s) Performed: Procedure(s): LUMBAR THREE-FOUR POSTERIOR LUMBAR INTERBODY FUSION (N/A)  Patient Location: PACU  Anesthesia Type:General  Level of Consciousness: awake and alert   Airway and Oxygen Therapy: Patient Spontanous Breathing  Post-op Pain: mild  Post-op Assessment: Post-op Vital signs reviewed  Post-op Vital Signs: stable  Complications: No apparent anesthesia complications

## 2013-02-16 NOTE — Op Note (Signed)
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 Post op Dx: L3-4spinal Stenosis with radiculopathy on left Procedure: Laminectomy L3 for decompression of L3 and L4 nerve roots with work greater than require for simple posterior interbody arthrodesis. Posterior lumbar interbody arthrodesis L3-4 with peek spacers local autograft and allograft. Pedicle screw fixation L3-4 with allograft posterior laterally and laterally. Surgeon: Barnett Abu Assistant: Lisbeth Renshaw Anesthesia: Gen. endotracheal Indications: The patient is a 57 year old individual is had a previous herniated nucleus pulposus L2-L3 on the left and the extraforaminal zone. She had a recurrence of disc herniation. She has substantial problems with chronic back pain and leg pain and she has a severe stenosis at the level of L3-L4 below level of her decompression which actually appears quite good been advised regarding need for surgical decompression and stabilization of level LIII-L4.  Procedure: The patient was brought to the operating room supine on a stretcher. After the smooth induction of general endotracheal anesthesia, she was turned prone. The bony prominences were appropriately padded and protected. The back was prepped with alcohol and DuraPrep and draped in a sterile fashion. A midline incision was created over the midportion of the lumbar spine and carried down to the lumbar dorsal fascia. A subperiosteal dissection was performed into the interlaminar space was believed to be L3-L4. This was confirmed radiographically. Laminotomies were then created removing the entire margin lamina of L3 out to and including the entirety of the facet at L3-L4. The underlying yellow ligament was identified noted to be significantly hypertrophied. By resecting it at significant compression and stenosis for the L3 nerve root was uncovered and this decompress the L3 nerve root superiorly. The L4 nerve root was  then decompressed inferiorly. There is substantial stenosis across the disc space secondary to broad-based protrusion of the disc itself. This procedure was carried out bilaterally. Then the disc space was isolated and the disc was opened with a 15 blade and. Accommodation of curettes and rongeurs were used to remove a substantial quantity of severely degenerated and desiccated disc material. The endplates were decorticated using a toothed curette. Both the medial lateral inferior and superior aspects of the disc space were cleared of substantial quantity of the disc material. The complete evacuation of the disc was performed interbody space was sized for appropriate size spacer. It is felt that a 13 mm spacer fit best and 2 peek spacers measuring 13 x 26 mm in size were filled with the mineralized bone matrix in the form of the cost and placed into the interspace. Additional autograft from the facetectomies were was chopped and placed into the interspace along with some the cost bone sponge. Then attention was turned to placing bone screws.  Using fluoroscopic guidance pedicle screws were placed in L3 and L4 using an awl to tap through the pedicle under fluoroscopic guidance and In with a 6.5 mm tap and placing 6.5 x 40 mm screws in L3 and 6.5 x 40 mm screws in L4. On the right side then a 50 mm precontoured rod was connected between the screw heads and on the left side of form he millimeter rod was used to connect the screw heads. These were torqued down in a neutral position. Final radiographs were obtained in AP and lateral projection. The posterolateral gutters were packed with bone graft they had been decorticated previously were packed away. The wound was then inspected and care was taken to make her that the L4 nerve roots were well  decompressed inferiorly the L3 nerve roots were decompressed well superiorly no debris fall into the areas of the decompression hemostasis was inspected and then the lumbar  dorsal fascia was closed with #1 Vicryl in interrupted fashion 2-0 Vicryl was used in the subcutaneous tissues and 3-0 Vicryl subcuticularly. Blood loss is estimated at approximately 200 cc. Of Cell Saver blood was returned.

## 2013-02-16 NOTE — Preoperative (Signed)
Beta Blockers   Reason not to administer Beta Blockers:Not Applicable 

## 2013-02-16 NOTE — Progress Notes (Signed)
Patient ID: Caitlin Ho, female   DOB: Nov 17, 1955, 57 y.o.   MRN: [id number] Patient is awake and alert. Has had some back pain. Incision is clean and dry. Motor function appears good and lower extremities.  Stable postop.

## 2013-02-16 NOTE — Progress Notes (Signed)
Per Sharlot Gowda RN, pt's CBG was 143 upon adm to PACU

## 2013-02-16 NOTE — Anesthesia Preprocedure Evaluation (Signed)
Anesthesia Evaluation  Patient identified by MRN, date of birth, ID band Patient awake    History of Anesthesia Complications (+) PONV  Airway Mallampati: II  Neck ROM: Full    Dental  (+) Teeth Intact   Pulmonary  breath sounds clear to auscultation        Cardiovascular hypertension, Rhythm:Regular Rate:Normal     Neuro/Psych  Headaches,    GI/Hepatic GERD-  ,  Endo/Other  Hypothyroidism   Renal/GU      Musculoskeletal   Abdominal   Peds  Hematology   Anesthesia Other Findings   Reproductive/Obstetrics                           Anesthesia Physical Anesthesia Plan  ASA: II  Anesthesia Plan: General   Post-op Pain Management:    Induction: Intravenous  Airway Management Planned: Oral ETT  Additional Equipment:   Intra-op Plan:   Post-operative Plan: Extubation in OR  Informed Consent: I have reviewed the patients History and Physical, chart, labs and discussed the procedure including the risks, benefits and alternatives for the proposed anesthesia with the patient or authorized representative who has indicated his/her understanding and acceptance.   Dental advisory given  Plan Discussed with: CRNA and Surgeon  Anesthesia Plan Comments:         Anesthesia Quick Evaluation

## 2013-02-16 NOTE — H&P (Signed)
Caitlin Ho is an 57 y.o. female.   Chief Complaint: Back and left lower extremity HPI: Caitlin Ho. is a 57 year old individual is had a previous discectomy at L2-L3 extraforaminal and left side. She had recurrence of this disc in August. 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 She is now admitted for this procedure.  Past Medical History  Diagnosis Date  . Hypertension   . Hypothyroid   . PONV (postoperative nausea and vomiting)   . Diabetes mellitus without complication   . GERD (gastroesophageal reflux disease)   . Headache(784.0)   . Cancer     cervical cancer cells    Past Surgical History  Procedure Laterality Date  . Back surgery    . Abdominal hysterectomy    . Lumbar disc surgery  10/03/2012    Dr Danielle Dess  . Thyroidectomy  2006  . Appendectomy  1978    History reviewed. No pertinent family history. Social History:  reports that she has never smoked. She has never used smokeless tobacco. She reports that she does not drink alcohol or use illicit drugs.  Allergies: No Known Allergies  Medications Prior to Admission  Medication Sig Dispense Refill  . cloNIDine (CATAPRES) 0.1 MG tablet Take 0.2 mg by mouth at bedtime.       Marland Kitchen HYDROcodone-acetaminophen (NORCO/VICODIN) 5-325 MG per tablet Take 1 tablet by mouth 2 (two) times daily as needed for moderate pain.      Marland Kitchen levothyroxine (SYNTHROID, LEVOTHROID) 175 MCG tablet Take 175 mcg by mouth daily before breakfast.      . lisinopril (PRINIVIL,ZESTRIL) 5 MG tablet Take 5 mg by mouth daily.      . metFORMIN (GLUCOPHAGE-XR) 500 MG 24 hr tablet Take 500 mg by mouth daily with breakfast.      . methocarbamol (ROBAXIN) 500 MG tablet Take 500 mg  by mouth 2 (two) times daily as needed for muscle spasms.      . pravastatin (PRAVACHOL) 10 MG tablet Take 10 mg by mouth at bedtime.        Results for orders placed during the hospital encounter of 02/16/13 (from the past 48 hour(s))  GLUCOSE, CAPILLARY     Status: None   Collection Time    02/16/13  8:54 AM      Result Value Range   Glucose-Capillary 99  70 - 99 mg/dL   No results found.  Review of Systems  Eyes: Negative.   Respiratory: Negative.   Cardiovascular: Negative.   Gastrointestinal: Negative.   Genitourinary: Negative.   Musculoskeletal: Positive for back pain.  Skin: Negative.   Neurological: Positive for focal weakness.       Mild weakness in quadricep in tibialis anterior on the left compared to right. Absent reflex in patella and Achilles on the left cranial nerve examination is within the limits of normal. Station and gait reveals a marked antalgic gait on left side  Endo/Heme/Allergies: Negative.   Psychiatric/Behavioral: Negative.     Blood pressure 118/84, pulse 77, temperature 97.9 F (36.6 C), temperature source Oral, resp. rate 20, SpO2 94.00%. Physical Exam  Constitutional: She appears well-developed and well-nourished.  HENT:  Head: Normocephalic and atraumatic.  Eyes: Conjunctivae and EOM are normal. Pupils are equal, round, and reactive to light.  Neck: Normal  range of motion. Neck supple.  Cardiovascular: Normal rate and regular rhythm.   Respiratory: Effort normal and breath sounds normal.  GI: Soft. Bowel sounds are normal.  Neurological:  Cranial nerve examination is within the limits of normal. Markedly antalgic gait on the left side. Weakness in the tibialis anterior group and also quadriceps on left absent reflexes in patella and the Achilles and left  Skin: Skin is warm and dry.  Psychiatric: She has a normal mood and affect. Her behavior is normal. Judgment and thought content normal.     Assessment/Plan Spondylosis and stenosis  L3-L4.  Posterior decompression with posterior lumbar interbody arthrodesis using peek spacers local autograft and allograft L3-L4  Caitlin Ho 02/16/2013, 12:24 PM

## 2013-02-16 NOTE — Transfer of Care (Signed)
Immediate Anesthesia Transfer of Care Note  Patient: Caitlin Ho  Procedure(s) Performed: Procedure(s): LUMBAR THREE-FOUR POSTERIOR LUMBAR INTERBODY FUSION (N/A)  Patient Location: PACU  Anesthesia Type:General  Level of Consciousness: sedated  Airway & Oxygen Therapy: Patient Spontanous Breathing and Patient connected to face mask oxygen  Post-op Assessment: Report given to PACU RN and Post -op Vital signs reviewed and stable  Post vital signs: Reviewed and stable  Complications: No apparent anesthesia complications

## 2013-02-17 ENCOUNTER — Encounter (HOSPITAL_COMMUNITY): Payer: Self-pay | Admitting: Emergency Medicine

## 2013-02-17 LAB — GLUCOSE, CAPILLARY
Glucose-Capillary: 123 mg/dL — ABNORMAL HIGH (ref 70–99)
Glucose-Capillary: 112 mg/dL — ABNORMAL HIGH (ref 70–99)
Glucose-Capillary: 110 mg/dL — ABNORMAL HIGH (ref 70–99)

## 2013-02-17 LAB — BASIC METABOLIC PANEL
CO2: 28 mEq/L (ref 19–32)
Chloride: 103 mEq/L (ref 96–112)
GFR calc Af Amer: 66 mL/min — ABNORMAL LOW (ref >90)
Glucose, Bld: 137 mg/dL — ABNORMAL HIGH (ref 70–99)
Potassium: 4.3 mEq/L (ref 3.5–5.1)
Sodium: 139 mEq/L (ref 135–145)

## 2013-02-17 LAB — CBC WITH DIFFERENTIAL/PLATELET
Basophils Absolute: 0.1 10*3/uL (ref 0.0–0.1)
Basophils Relative: 0 % (ref 0–1)
Eosinophils Absolute: 0.2 10*3/uL (ref 0.0–0.7)
Eosinophils Relative: 1 % (ref 0–5)
Hemoglobin: 12.1 g/dL (ref 12.0–15.0)
Lymphocytes Relative: 23 % (ref 12–46)
Neutro Abs: 12.1 10*3/uL — ABNORMAL HIGH (ref 1.7–7.7)
Neutrophils Relative %: 66 % (ref 43–77)
Platelets: 252 10*3/uL (ref 150–400)
RDW: 13.4 % (ref 11.5–15.5)
WBC: 18.3 10*3/uL — ABNORMAL HIGH (ref 4.0–10.5)

## 2013-02-17 MED ORDER — DEXAMETHASONE SODIUM PHOSPHATE 4 MG/ML IJ SOLN
4.0000 mg | Freq: Two times a day (BID) | INTRAMUSCULAR | Status: DC
  Administered 2013-02-17: 4 mg via INTRAVENOUS
  Filled 2013-02-17 (×3): qty 1

## 2013-02-17 MED FILL — Sodium Chloride IV Soln 0.9%: INTRAVENOUS | Qty: 2000 | Status: AC

## 2013-02-17 MED FILL — Heparin Sodium (Porcine) Inj 1000 Unit/ML: INTRAMUSCULAR | Qty: 30 | Status: AC

## 2013-02-17 NOTE — Progress Notes (Signed)
   CARE MANAGEMENT NOTE 02/17/2013  Patient:  Caitlin Ho, Caitlin Ho   Account Number:  [id number]  Date Initiated:  02/17/2013  Documentation initiated by:  Jiles Crocker  Subjective/Objective Assessment:   ADMITTED FOR SURGERY -Laminectomy L3 for decompression of L3 and L4     Action/Plan:   CM FOLLOWING FOR DCP   Anticipated DC Date:  02/22/2013   Anticipated DC Plan:  POSSIBLY HOME W HOME HEALTH SERVICES, AWAITING ON PT/OT EVALS FOR DC NEEDS     DC Planning Services  CM consult          Status of service:  In process, will continue to follow Medicare Important Message given?  NA - LOS <3 / Initial given by admissions (If response is "NO", the following Medicare IM given date fields will be blank)  Per UR Regulation:  Reviewed for med. necessity/level of care/duration of stay  Comments:  12/17/2014Abelino Derrick RN,BSN,MHA [phone]

## 2013-02-17 NOTE — Progress Notes (Signed)
Orthopedic Tech Progress Note Patient Details:  Caitlin Ho 06/01/1955 [id number]  Patient ID: [name], female   DOB: [birth year], 57 y.o.   MRN: [id number]   Caitlin Ho 02/17/2013, 10:06 AMCalled bio-tech for lumbar fusion brace.

## 2013-02-17 NOTE — Progress Notes (Signed)
PT Cancellation Note  Patient Details Name: [name] MRN: [id number] DOB: 01-16-1956   Cancelled Treatment:     Attempted evaluation x2, per nursing will hold secondary to BP issues at this time. Will see in am for full evaluation.   Fabio Asa 02/17/2013, 4:24 PM

## 2013-02-17 NOTE — Evaluation (Addendum)
Occupational Therapy Evaluation Patient Details Name: Caitlin Ho MRN: [id number] DOB: 14-Nov-1955 Today's Date: 02/17/2013 Time: 1191-4782 OT Time Calculation (min): 22 min  OT Assessment / Plan / Recommendation History of present illness 57 y.o. s/p LUMBAR THREE-FOUR POSTERIOR LUMBAR INTERBODY FUSION (N/A   Clinical Impression   Pt moving well during evaluation and feels good about information that OT covered. Feel pt is safe to d/c home with family available to assist 24/7.     OT Assessment  Patient does not need any further OT services    Follow Up Recommendations  No OT follow up;Supervision/Assistance - 24 hour    Barriers to Discharge      Equipment Recommendations  Toilet rise with handles (pt states that they will get this on their own if needed)   Recommendations for Other Services    Frequency       Precautions / Restrictions Precautions Precautions: Back Precaution Booklet Issued: Yes (comment) Precaution Comments: Educated on precautions Required Braces or Orthoses: Spinal Brace Spinal Brace: Applied in sitting position   Pertinent Vitals/Pain Pain 6-7/10. Increased activity during session.     ADL  Upper Body Dressing: Set up;Supervision/safety Where Assessed - Upper Body Dressing: Unsupported sitting Lower Body Dressing: Maximal assistance Where Assessed - Lower Body Dressing: Unsupported sit to stand Toilet Transfer: Min Agricultural engineer Method: Sit to Barista: Regular height toilet;Comfort height toilet;Grab bars Toileting - Clothing Manipulation and Hygiene: Moderate assistance Where Assessed - Toileting Clothing Manipulation and Hygiene: Standing Tub/Shower Transfer: Min Psychologist, prison and probation services Method: Science writer: Shower seat with back Equipment Used: Back brace;Gait belt;Reacher;Long-handled sponge;Long-handled shoe horn;Sock aid Transfers/Ambulation Related to  ADLs: Min guard for ambulation and Supervision/Min guard for transfers. ADL Comments: Practiced donning/doffing back brace-cues for precautions. Educated on DME for toilet. Practiced shower transfer. Educated on AE for LB ADLs-pt stated later she is not concerned about dressing and bathing as husband will assist as needed.  Pt verbalizes she feels comfortable with bed mobility and did not feel need to practice (states she has had previous back surgeries). Educated on standing in front of bed/chair when pulling up LB clothing over bottom.    OT Diagnosis:    OT Problem List:   OT Treatment Interventions:     OT Goals(Current goals can be found in the care plan section)    Visit Information  Last OT Received On: 02/17/13 Assistance Needed: +1 History of Present Illness: 57 y.o. s/p LUMBAR THREE-FOUR POSTERIOR LUMBAR INTERBODY FUSION (N/A       Prior Functioning     Home Living Family/patient expects to be discharged to:: Private residence Living Arrangements: Spouse/significant other Available Help at Discharge: Family;Available 24 hours/day Type of Home: House Home Access: Stairs to enter [employer] of Steps: 5 Entrance Stairs-Rails: Can reach both Home Layout: One level Home Equipment: Walker - 2 wheels;Shower seat;Hand held shower head Prior Function Level of Independence: Independent with assistive device(s) Comments: used walker sometimes Communication Communication: No difficulties Dominant Hand: Right         Vision/Perception     Cognition  Cognition Arousal/Alertness: Awake/alert Behavior During Therapy: WFL for tasks assessed/performed Overall Cognitive Status: Within Functional Limits for tasks assessed    Extremity/Trunk Assessment Upper Extremity Assessment Upper Extremity Assessment: Overall WFL for tasks assessed Lower Extremity Assessment Lower Extremity Assessment: Defer to PT evaluation     Mobility Bed Mobility Bed Mobility: Not  assessed Details for Bed Mobility Assistance: Educated on log rolling technique  Transfers Transfers: Sit to Stand;Stand to Sit Sit to Stand: 4: Min guard;5: Supervision;With upper extremity assist;From chair/3-in-1;From toilet Stand to Sit: 4: Min guard;5: Supervision;To chair/3-in-1;To toilet Details for Transfer Assistance: Cues for technique.     Exercise     Balance     End of Session OT - End of Session Equipment Utilized During Treatment: Gait belt;Back brace Activity Tolerance: Patient tolerated treatment well Patient left: in chair;with call bell/phone within reach;with family/visitor present  Lorri Frederick OTR/L [phone] 02/17/2013, 12:43 PM

## 2013-02-17 NOTE — Progress Notes (Signed)
Called MD regarding patient's low BP's.  At 1351 81/51; at 1500 90/46 on right arm and 86/50 on left arm.  MD ordered 250 bolus of NS, CBC and BM stat.  Will continue to monitor patient.  Call light placed within reach.

## 2013-02-17 NOTE — Progress Notes (Signed)
Subjective: Patient reports patient is having no leg pain moderate back pain. She mobilized earlier now is having some headache.  Objective: Vital signs in last 24 hours: Temp:  [98 F (36.7 C)-99.4 F (37.4 C)] 98.9 F (37.2 C) (12/17 1750) Pulse Rate:  [85-106] 106 (12/17 1750) Resp:  [18] 18 (12/17 1750) BP: (81-118)/(46-74) 99/50 mmHg (12/17 1750) SpO2:  [92 %-95 %] 93 % (12/17 1750)  Intake/Output from previous day: 12/16 0701 - 12/17 0700 In: 1950 [I.V.:1850; Blood:100] Out: 1325 [Urine:1025; Blood:300] Intake/Output this shift:    Incision is clean and dry motor function appears intact in lower extremities.  Lab Results:  Recent Labs  02/17/13 1655  WBC 18.3*  HGB 12.1  HCT 36.2  PLT 252   BMET  Recent Labs  02/17/13 1655  NA 139  K 4.3  CL 103  CO2 28  GLUCOSE 137*  BUN 17  CREATININE 1.07  CALCIUM 8.2*    Studies/Results: Dg Lumbar Spine 2-3 Views  02/16/2013   CLINICAL DATA:  Posterior fusion  EXAM: LUMBAR SPINE - 2-3 VIEW; DG C-ARM 1-60 MIN  COMPARISON:  CT 01/18/2013  FINDINGS: Intraoperative spot imaging demonstrates changes of posterior fusion at L3-4. Normal alignment. No hardware or bony complicating feature visualized.  IMPRESSION: L3-4 posterior fusion.   Electronically Signed   By: Charlett Nose M.D.   On: 02/16/2013 17:03   Dg Lumbar Spine 1 View  02/16/2013   CLINICAL DATA:  Back pain.  EXAM: LUMBAR SPINE - 1 VIEW  COMPARISON:  CT myelogram 01/18/2013.  FINDINGS: Intraoperative lateral film at 1321 hr demonstrates a towel clamp on the spinous process of L3.  IMPRESSION: L3 localized.   Electronically Signed   By: Davonna Belling M.D.   On: 02/16/2013 16:57   Dg C-arm 1-60 Min  02/16/2013   CLINICAL DATA:  Posterior fusion  EXAM: LUMBAR SPINE - 2-3 VIEW; DG C-ARM 1-60 MIN  COMPARISON:  CT 01/18/2013  FINDINGS: Intraoperative spot imaging demonstrates changes of posterior fusion at L3-4. Normal alignment. No hardware or bony complicating  feature visualized.  IMPRESSION: L3-4 posterior fusion.   Electronically Signed   By: Charlett Nose M.D.   On: 02/16/2013 17:03    Assessment/Plan: Postoperative hypotension. Hematocrit is 36. Patient does not have acute blood loss anemia.  LOS: 1 day  Sustain with IV fluids. Small dose of Decadron today.   Jahfari Ambers J 02/17/2013, 6:23 PM

## 2013-02-18 LAB — GLUCOSE, CAPILLARY
Glucose-Capillary: 108 mg/dL — ABNORMAL HIGH (ref 70–99)
Glucose-Capillary: 118 mg/dL — ABNORMAL HIGH (ref 70–99)
Glucose-Capillary: 124 mg/dL — ABNORMAL HIGH (ref 70–99)

## 2013-02-18 MED ORDER — DEXAMETHASONE SODIUM PHOSPHATE 4 MG/ML IJ SOLN
2.0000 mg | Freq: Two times a day (BID) | INTRAMUSCULAR | Status: DC
  Administered 2013-02-18 – 2013-02-19 (×4): 2 mg via INTRAVENOUS
  Filled 2013-02-18: qty 0.5
  Filled 2013-02-18 (×2): qty 1
  Filled 2013-02-18 (×3): qty 0.5

## 2013-02-18 NOTE — Progress Notes (Signed)
PT Cancellation Note  Patient Details Name: Caitlin Ho MRN: [id number] DOB: [birth year]   Cancelled Treatment:     Attempted evaluation this am, pt with severe spasms, will try to coordinate with medication and evaluate later this am.   Fabio Asa 02/18/2013, 8:30 AM

## 2013-02-18 NOTE — Evaluation (Signed)
Physical Therapy Evaluation Patient Details Name: Caitlin Ho MRN: [id number] DOB: 12-15-1955 Today's Date: 02/18/2013 Time: 1308-6578 PT Time Calculation (min): 28 min  PT Assessment / Plan / Recommendation History of Present Illness  56 y.o. s/p LUMBAR THREE-FOUR POSTERIOR LUMBAR INTERBODY FUSION (N/A  Clinical Impression  Patient demonstrates deficits in functional mobility as indicated below. Pt will benefit from continued skilled PT to address deficits and maximize function. Will continue to see and progress activity as tolerated.     PT Assessment  Patient needs continued PT services    Follow Up Recommendations  No PT follow up;Supervision - Intermittent    Does the patient have the potential to tolerate intense rehabilitation      Barriers to Discharge        Equipment Recommendations  None recommended by PT    Recommendations for Other Services     Frequency Min 5X/week    Precautions / Restrictions Precautions Precautions: Back Precaution Booklet Issued: Yes (comment) Precaution Comments: Educated on precautions Required Braces or Orthoses: Spinal Brace Spinal Brace: Applied in sitting position   Pertinent Vitals/Pain 6/10      Mobility  Bed Mobility Bed Mobility: Not assessed Details for Bed Mobility Assistance: Educated on log rolling technique Transfers Transfers: Sit to Stand;Stand to Sit Sit to Stand: 4: Min guard;5: Supervision;With upper extremity assist;From chair/3-in-1;From toilet Stand to Sit: 4: Min guard;5: Supervision;To chair/3-in-1;To toilet Details for Transfer Assistance: VCs for handplacement and Cues for technique. Ambulation/Gait Ambulation/Gait Assistance: 5: Supervision Ambulation Distance (Feet): 410 Feet (30' without AD ) Assistive device: Rolling walker;None Ambulation/Gait Assistance Details: increased instability without RW, much improved with use of RW, VCs for posture and positioning Gait Pattern: Step-through  pattern;Decreased stride length Gait velocity: decreased    Exercises     PT Diagnosis: Difficulty walking;Abnormality of gait;Acute pain  PT Problem List: Decreased activity tolerance;Decreased mobility;Decreased knowledge of use of DME PT Treatment Interventions: DME instruction;Gait training;Stair training;Functional mobility training;Therapeutic activities;Therapeutic exercise;Patient/family education     PT Goals(Current goals can be found in the care plan section) Acute Rehab PT Goals Patient Stated Goal: to go home PT Goal Formulation: With patient Time For Goal Achievement: 03/04/13 Potential to Achieve Goals: Good  Visit Information  Last PT Received On: 02/18/13 Assistance Needed: +1 History of Present Illness: 57 y.o. s/p LUMBAR THREE-FOUR POSTERIOR LUMBAR INTERBODY FUSION (N/A       Prior Functioning  Home Living Family/patient expects to be discharged to:: Private residence Living Arrangements: Spouse/significant other Available Help at Discharge: Family;Available 24 hours/day Type of Home: House Home Access: Stairs to enter [employer] of Steps: 5 Entrance Stairs-Rails: Can reach both Home Layout: One level Home Equipment: Walker - 2 wheels;Shower seat;Hand held shower head Prior Function Level of Independence: Independent with assistive device(s) Comments: used walker sometimes Communication Communication: No difficulties Dominant Hand: Right    Cognition  Cognition Arousal/Alertness: Awake/alert Behavior During Therapy: WFL for tasks assessed/performed Overall Cognitive Status: Within Functional Limits for tasks assessed    Extremity/Trunk Assessment Upper Extremity Assessment Upper Extremity Assessment: Overall WFL for tasks assessed Lower Extremity Assessment Lower Extremity Assessment: Overall WFL for tasks assessed   Balance High Level Balance High Level Balance Activites: Side stepping;Backward walking;Direction changes;Turns High  Level Balance Comments: steady  End of Session PT - End of Session Equipment Utilized During Treatment: Back brace Activity Tolerance: Patient tolerated treatment well Patient left: in chair;with call bell/phone within reach;with family/visitor present Nurse Communication: Mobility status  GP  Fabio Asa 02/18/2013, 11:47 AM Charlotte Crumb, PT DPT  [phone]

## 2013-02-18 NOTE — Progress Notes (Signed)
Subjective: Patient reports Some spasms in both lower extremities  Objective: Vital signs in last 24 hours: Temp:  [98 F (36.7 C)-101.1 F (38.4 C)] 98.5 F (36.9 C) (12/18 0500) Pulse Rate:  [85-112] 103 (12/18 0500) Resp:  [17-18] 18 (12/18 0140) BP: (81-111)/(46-57) 104/52 mmHg (12/18 0500) SpO2:  [92 %-95 %] 94 % (12/18 0500)  Intake/Output from previous day: 12/17 0701 - 12/18 0700 In: -  Out: 950 [Urine:950] Intake/Output this shift:    Incision is clean dry. Motor function appears intact in both lower extremities. Blood pressure is better today.  Lab Results:  Recent Labs  02/17/13 1655  WBC 18.3*  HGB 12.1  HCT 36.2  PLT 252   BMET  Recent Labs  02/17/13 1655  NA 139  K 4.3  CL 103  CO2 28  GLUCOSE 137*  BUN 17  CREATININE 1.07  CALCIUM 8.2*    Studies/Results: Dg Lumbar Spine 2-3 Views  02/16/2013   CLINICAL DATA:  Posterior fusion  EXAM: LUMBAR SPINE - 2-3 VIEW; DG C-ARM 1-60 MIN  COMPARISON:  CT 01/18/2013  FINDINGS: Intraoperative spot imaging demonstrates changes of posterior fusion at L3-4. Normal alignment. No hardware or bony complicating feature visualized.  IMPRESSION: L3-4 posterior fusion.   Electronically Signed   By: Charlett Nose M.D.   On: 02/16/2013 17:03   Dg Lumbar Spine 1 View  02/16/2013   CLINICAL DATA:  Back pain.  EXAM: LUMBAR SPINE - 1 VIEW  COMPARISON:  CT myelogram 01/18/2013.  FINDINGS: Intraoperative lateral film at 1321 hr demonstrates a towel clamp on the spinous process of L3.  IMPRESSION: L3 localized.   Electronically Signed   By: Davonna Belling M.D.   On: 02/16/2013 16:57   Dg C-arm 1-60 Min  02/16/2013   CLINICAL DATA:  Posterior fusion  EXAM: LUMBAR SPINE - 2-3 VIEW; DG C-ARM 1-60 MIN  COMPARISON:  CT 01/18/2013  FINDINGS: Intraoperative spot imaging demonstrates changes of posterior fusion at L3-4. Normal alignment. No hardware or bony complicating feature visualized.  IMPRESSION: L3-4 posterior fusion.    Electronically Signed   By: Charlett Nose M.D.   On: 02/16/2013 17:03    Assessment/Plan: Hep well IV.   LOS: 2 days  Mobilize patient as she tolerates. DC dressing okay to shower.   Zahra Peffley J 02/18/2013, 9:40 AM

## 2013-02-19 LAB — GLUCOSE, CAPILLARY: Glucose-Capillary: 108 mg/dL — ABNORMAL HIGH (ref 70–99)

## 2013-02-19 MED ORDER — OXYCODONE-ACETAMINOPHEN 5-325 MG PO TABS: 1.0000 | ORAL_TABLET | ORAL | Status: DC | PRN

## 2013-02-19 MED ORDER — DEXAMETHASONE 0.75 MG PO TABS: ORAL_TABLET | ORAL | Status: DC

## 2013-02-19 MED ORDER — METHOCARBAMOL 500 MG PO TABS: 500.0000 mg | ORAL_TABLET | Freq: Four times a day (QID) | ORAL | Status: DC | PRN

## 2013-02-19 NOTE — Progress Notes (Signed)
Physical Therapy Treatment Patient Details Name: Caitlin Ho MRN: [id number] DOB: 29-Jan-1956 Today's Date: 02/19/2013 Time: 0812-0830 PT Time Calculation (min): 18 min  PT Assessment / Plan / Recommendation  History of Present Illness 57 y.o. s/p LUMBAR THREE-FOUR POSTERIOR LUMBAR INTERBODY FUSION (N/A   PT Comments   Patient demonstrates improvements in activity tolerance and mobility today. Ambulated, performed stairs, and performed simulated car transfer without any trouble. Feel patient will be safe for dc home from a mobility standpoint.   Follow Up Recommendations  No PT follow up;Supervision - Intermittent     Does the patient have the potential to tolerate intense rehabilitation     Barriers to Discharge        Equipment Recommendations  None recommended by PT    Recommendations for Other Services    Frequency Min 5X/week   Progress towards PT Goals Progress towards PT goals: Progressing toward goals  Plan Current plan remains appropriate    Precautions / Restrictions Precautions Precautions: Back Precaution Booklet Issued: Yes (comment) Precaution Comments: Educated on precautions Required Braces or Orthoses: Spinal Brace Spinal Brace: Applied in sitting position   Pertinent Vitals/Pain 6/10    Mobility  Bed Mobility Bed Mobility: Sit to Sidelying Right Sit to Sidelying Right: 6: Modified independent (Device/Increase time) Details for Bed Mobility Assistance: good technique no assist or cues needed Transfers Transfers: Sit to Stand;Stand to Sit Sit to Stand: 5: Supervision Stand to Sit: 5: Supervision Details for Transfer Assistance: Patient doing much better this session  Ambulation/Gait Ambulation/Gait Assistance: 5: Supervision Ambulation Distance (Feet): 460 Feet Assistive device: Rolling walker;None Ambulation/Gait Assistance Details: much improved gait with use of RW today Gait Pattern: Step-through pattern;Decreased stride length Gait velocity:  improved Stairs: Yes Stairs Assistance: 5: Supervision Stairs Assistance Details (indicate cue type and reason): VCs for sequencing Stair Management Technique: Two rails;Step to pattern;Forwards Number of Stairs: 5 (performed x2)      PT Goals (current goals can now be found in the care plan section) Acute Rehab PT Goals Patient Stated Goal: to go home PT Goal Formulation: With patient Time For Goal Achievement: 03/04/13 Potential to Achieve Goals: Good  Visit Information  Last PT Received On: 02/19/13 Assistance Needed: +1 History of Present Illness: 57 y.o. s/p LUMBAR THREE-FOUR POSTERIOR LUMBAR INTERBODY FUSION (N/A    Subjective Data  Subjective: I feel pretty good today Patient Stated Goal: to go home   Cognition  Cognition Arousal/Alertness: Awake/alert Behavior During Therapy: WFL for tasks assessed/performed Overall Cognitive Status: Within Functional Limits for tasks assessed    Balance  High Level Balance High Level Balance Activites: Side stepping;Backward walking;Direction changes;Turns High Level Balance Comments: steady  End of Session PT - End of Session Equipment Utilized During Treatment: Back brace Activity Tolerance: Patient tolerated treatment well Patient left: in bed;with call bell/phone within reach Nurse Communication: Mobility status   GP     Fabio Asa 02/19/2013, 8:35 AM Charlotte Crumb, PT DPT  [phone]

## 2013-02-19 NOTE — Progress Notes (Signed)
Subjective: Patient reports Feeling better but having some leg cramping today  Objective: Vital signs in last 24 hours: Temp:  [97.8 F (36.6 C)-99.9 F (37.7 C)] 99.1 F (37.3 C) (12/19 1339) Pulse Rate:  [87-108] 96 (12/19 1339) Resp:  [16-18] 16 (12/19 1339) BP: (110-150)/(64-89) 140/85 mmHg (12/19 1339) SpO2:  [91 %-96 %] 95 % (12/19 1339)  Intake/Output from previous day: 12/18 0701 - 12/19 0700 In: 1130 [P.O.:630; I.V.:500] Out: -  Intake/Output this shift:    Incision is clean and dry. Dressing has been removed.  Lab Results:  Recent Labs  02/17/13 1655  WBC 18.3*  HGB 12.1  HCT 36.2  PLT 252   BMET  Recent Labs  02/17/13 1655  NA 139  K 4.3  CL 103  CO2 28  GLUCOSE 137*  BUN 17  CREATININE 1.07  CALCIUM 8.2*    Studies/Results: No results found.  Assessment/Plan: Stable post  LOS: 3 days  Plan discharge for a.m. Decadron taper.   Ebunoluwa Gernert J 02/19/2013, 2:53 PM

## 2013-02-19 NOTE — Discharge Summary (Signed)
Physician Discharge Summary  Patient ID: Caitlin Ho MRN: [id number] DOB/AGE: 55-Jun-1957 55 y.o.  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 She tolerated the procedure well. She is discharged home  Consults: None  Significant Diagnostic Studies: None  Treatments: surgery: Laminectomy L3-L4 decompression of L3 and L4 nerve roots posterior lumbar interbody arthrodesis using peek spacers pedicle screw fixation L3-L4.  Discharge Exam: Blood pressure 140/85, pulse 96, temperature 99.1 F (37.3 C), temperature source Oral, resp. rate 16, SpO2 95.00%. Incision is clean and dry. Motor function is intact.  Disposition: 01-Home or Self Care  Discharge Orders   Future Orders Complete By Expires   Call MD for:  redness, tenderness, or signs of infection (pain, swelling, redness, odor or green/yellow discharge around incision site)  As directed    Call MD for:  severe uncontrolled pain  As directed    Call MD for:  temperature >100.4  As directed    Diet - low sodium heart healthy  As directed    Discharge instructions  As directed    Comments:     Okay to shower. Do not apply salves or appointments to incision. No heavy lifting with the upper extremities greater than 15 pounds. May resume driving when not requiring pain medication and patient feels comfortable with doing so.   Increase activity slowly  As directed        Medication List         cloNIDine 0.1 MG tablet  Commonly known as:  CATAPRES  Take 0.2 mg by mouth at bedtime.     dexamethasone 0.75 MG tablet  Commonly known as:  DECADRON  2 tablets twice daily for 2 days, one tablet twice daily for 2 days, one tablet daily for 2 days.  Then one every other day until finished.     HYDROcodone-acetaminophen 5-325 MG per tablet  Commonly known as:  NORCO/VICODIN  Take 1 tablet by mouth 2 (two) times daily as needed for moderate pain.     levothyroxine 175 MCG tablet  Commonly known as:  SYNTHROID, LEVOTHROID  Take 175 mcg by mouth daily before breakfast.     lisinopril 5 MG tablet  Commonly known as:  PRINIVIL,ZESTRIL  Take 5 mg by mouth daily.     metFORMIN 500 MG 24 hr tablet  Commonly known as:  GLUCOPHAGE-XR  Take 500 mg by mouth daily with breakfast.     methocarbamol 500 MG tablet  Commonly known as:  ROBAXIN  Take 1 tablet (500 mg total) by mouth every 6 (six) hours as needed for muscle spasms.     methocarbamol 500 MG tablet  Commonly known as:  ROBAXIN  Take 500 mg by mouth 2 (two) times daily as needed for muscle spasms.     oxyCODONE-acetaminophen 5-325 MG per tablet  Commonly known as:  PERCOCET/ROXICET  Take 1-2 tablets by mouth every 4 (four) hours as needed for moderate pain.     pravastatin 10 MG tablet  Commonly known as:  PRAVACHOL  Take 10 mg by mouth at bedtime.         SignedStefani Dama 02/19/2013, 3:09 PM

## 2013-02-20 NOTE — Progress Notes (Signed)
Physical Therapy Discharge Patient Details Name: ABBIGALE MCELHANEY MRN: [id number] DOB: 1955-11-27 Today's Date: 02/20/2013 Time: 7829-5621 PT Time Calculation (min): 11 min  Patient discharged from PT services secondary to goals met and no further PT needs identified.  Please see latest therapy progress note for current level of functioning and progress toward goals.    Progress and discharge plan discussed with patient and/or caregiver: Patient/Caregiver agrees with plan   GP     Fabio Asa 02/20/2013, 9:51 AM

## 2013-02-20 NOTE — Progress Notes (Signed)
Pt discharged home to self care . D/c instruction ,incision care and follow up with Dr Danielle Dess given and pt verbalized good understanding , back precaution  reinforced.  Pt verbalized some relief of pain robaxin 500 mg give for pain and spasm prior to discharge.Condition at discharge was stable.

## 2013-03-04 HISTORY — PX: LUMBAR FUSION: SHX111

## 2013-03-11 DIAGNOSIS — G8929 Other chronic pain: Secondary | ICD-10-CM | POA: Insufficient documentation

## 2013-03-11 DIAGNOSIS — M545 Low back pain, unspecified: Secondary | ICD-10-CM

## 2013-03-11 HISTORY — DX: Low back pain, unspecified: M54.50

## 2013-06-10 DIAGNOSIS — M5417 Radiculopathy, lumbosacral region: Secondary | ICD-10-CM | POA: Insufficient documentation

## 2013-06-28 ENCOUNTER — Other Ambulatory Visit: Payer: Self-pay | Admitting: Neurological Surgery

## 2013-07-08 ENCOUNTER — Encounter (HOSPITAL_COMMUNITY): Payer: Self-pay | Admitting: Pharmacy Technician

## 2013-07-13 ENCOUNTER — Encounter (HOSPITAL_COMMUNITY)
Admission: RE | Admit: 2013-07-13 | Discharge: 2013-07-13 | Disposition: A | Discharge status: Home / Self Care | Payer: BC Managed Care – PPO | Source: Ambulatory Visit | Attending: Neurological Surgery | Admitting: Neurological Surgery

## 2013-07-13 ENCOUNTER — Encounter (HOSPITAL_COMMUNITY): Payer: Self-pay

## 2013-07-13 DIAGNOSIS — Z01812 Encounter for preprocedural laboratory examination: Secondary | ICD-10-CM | POA: Insufficient documentation

## 2013-07-13 LAB — COMPREHENSIVE METABOLIC PANEL
ALT: 19 U/L (ref 0–35)
AST: 22 U/L (ref 0–37)
Albumin: 3.8 g/dL (ref 3.5–5.2)
Alkaline Phosphatase: 80 U/L (ref 39–117)
BUN: 14 mg/dL (ref 6–23)
CHLORIDE: 101 meq/L (ref 96–112)
CO2: 27 meq/L (ref 19–32)
CREATININE: 0.85 mg/dL (ref 0.50–1.10)
Calcium: 9.4 mg/dL (ref 8.4–10.5)
GFR calc Af Amer: 88 mL/min — ABNORMAL LOW (ref >90)
GFR, EST NON AFRICAN AMERICAN: 76 mL/min — AB (ref >90)
Glucose, Bld: 144 mg/dL — ABNORMAL HIGH (ref 70–99)
Potassium: 4.4 mEq/L (ref 3.7–5.3)
Sodium: 141 mEq/L (ref 137–147)
Total Protein: 7.4 g/dL (ref 6.0–8.3)

## 2013-07-13 LAB — CBC
HEMATOCRIT: 43.8 % (ref 36.0–46.0)
HEMOGLOBIN: 14.8 g/dL (ref 12.0–15.0)
MCH: 29.5 pg (ref 26.0–34.0)
MCHC: 33.8 g/dL (ref 30.0–36.0)
MCV: 87.3 fL (ref 78.0–100.0)
Platelets: 307 10*3/uL (ref 150–400)
RBC: 5.02 MIL/uL (ref 3.87–5.11)
RDW: 13.3 % (ref 11.5–15.5)
WBC: 11.8 10*3/uL — ABNORMAL HIGH (ref 4.0–10.5)

## 2013-07-13 LAB — TYPE AND SCREEN
ABO/RH(D): O POS
Antibody Screen: NEGATIVE

## 2013-07-13 LAB — HCG, SERUM, QUALITATIVE: Preg, Serum: NEGATIVE

## 2013-07-13 LAB — SURGICAL PCR SCREEN
MRSA, PCR: NEGATIVE
Staphylococcus aureus: NEGATIVE

## 2013-07-13 NOTE — Progress Notes (Signed)
07/13/13 1447  OBSTRUCTIVE SLEEP APNEA  Have you ever been diagnosed with sleep apnea through a sleep study? No  Do you snore loudly (loud enough to be heard through closed doors)?  1  Do you often feel tired, fatigued, or sleepy during the daytime? 0  Has anyone observed you stop breathing during your sleep? 0  Do you have, or are you being treated for high blood pressure? 1  BMI more than 35 kg/m2? 0  Age over 55 years old? 1  Neck circumference greater than 40 cm/16 inches? 1  Gender: 0  Obstructive Sleep Apnea Score 4  Score 4 or greater  Results sent to PCP

## 2013-07-13 NOTE — Pre-Procedure Instructions (Signed)
LEEANNA SLABY  07/13/2013   Your procedure is scheduled on:  May 18th, Monday   Report to [facility] Admitting at 5:30 AM.   Call this number if you have problems the morning of surgery: [phone]   Remember:   Do not eat food or drink liquids after midnight Sunday.   Take these medicines the morning of surgery with A SIP OF WATER: Gabapentin, Levothyroxine, Oxycodone   Do not wear jewelry, make-up or nail polish.  Do not wear lotions, powders, or perfumes. You NOT may wear deodorant.  Do not shave underarms & legs 48 hours prior to surgery.    Do not bring valuables to the hospital.  [facility] is not responsible for any belongings or valuables.               Contacts, dentures or bridgework may not be worn into surgery.  Leave suitcase in the car. After surgery it may be brought to your room.  For patients admitted to the hospital, discharge time is determined by your treatment team.              Name and phone number of your driver:   Freddy Spadafora -- spouse      Special Instructions: "Preparing for Surgery" instruction sheet.   Please read over the following fact sheets that you were given: Pain Booklet, Coughing and Deep Breathing, Blood Transfusion Information, MRSA Information and Surgical Site Infection Prevention

## 2013-07-18 MED ORDER — CEFAZOLIN SODIUM-DEXTROSE 2-3 GM-% IV SOLR
2.0000 g | INTRAVENOUS | Status: AC
  Administered 2013-07-19 (×2): 2 g via INTRAVENOUS
  Filled 2013-07-18: qty 50

## 2013-07-19 ENCOUNTER — Inpatient Hospital Stay (HOSPITAL_COMMUNITY)
Admission: RE | Admit: 2013-07-19 | Discharge: 2013-07-23 | DRG: 460 | Disposition: A | Discharge status: Home / Self Care | Payer: BC Managed Care – PPO | Source: Ambulatory Visit | Attending: Neurological Surgery | Admitting: Neurological Surgery

## 2013-07-19 ENCOUNTER — Encounter (HOSPITAL_COMMUNITY)
Admission: RE | Disposition: A | Discharge status: Home / Self Care | Payer: BC Managed Care – PPO | Source: Ambulatory Visit | Attending: Neurological Surgery

## 2013-07-19 ENCOUNTER — Inpatient Hospital Stay (HOSPITAL_COMMUNITY): Payer: BC Managed Care – PPO | Admitting: Anesthesiology

## 2013-07-19 ENCOUNTER — Encounter (HOSPITAL_COMMUNITY): Payer: BC Managed Care – PPO | Admitting: Anesthesiology

## 2013-07-19 ENCOUNTER — Encounter (HOSPITAL_COMMUNITY): Payer: Self-pay | Admitting: Anesthesiology

## 2013-07-19 ENCOUNTER — Inpatient Hospital Stay (HOSPITAL_COMMUNITY): Payer: BC Managed Care – PPO

## 2013-07-19 DIAGNOSIS — E039 Hypothyroidism, unspecified: Secondary | ICD-10-CM | POA: Diagnosis present

## 2013-07-19 DIAGNOSIS — Z8541 Personal history of malignant neoplasm of cervix uteri: Secondary | ICD-10-CM

## 2013-07-19 DIAGNOSIS — M5126 Other intervertebral disc displacement, lumbar region: Secondary | ICD-10-CM | POA: Diagnosis present

## 2013-07-19 DIAGNOSIS — M5416 Radiculopathy, lumbar region: Secondary | ICD-10-CM

## 2013-07-19 DIAGNOSIS — E119 Type 2 diabetes mellitus without complications: Secondary | ICD-10-CM | POA: Diagnosis present

## 2013-07-19 DIAGNOSIS — Z9089 Acquired absence of other organs: Secondary | ICD-10-CM

## 2013-07-19 DIAGNOSIS — I10 Essential (primary) hypertension: Secondary | ICD-10-CM | POA: Diagnosis present

## 2013-07-19 DIAGNOSIS — Z79899 Other long term (current) drug therapy: Secondary | ICD-10-CM

## 2013-07-19 DIAGNOSIS — K219 Gastro-esophageal reflux disease without esophagitis: Secondary | ICD-10-CM | POA: Diagnosis present

## 2013-07-19 DIAGNOSIS — M47817 Spondylosis without myelopathy or radiculopathy, lumbosacral region: Principal | ICD-10-CM | POA: Diagnosis present

## 2013-07-19 DIAGNOSIS — Z981 Arthrodesis status: Secondary | ICD-10-CM

## 2013-07-19 HISTORY — DX: Radiculopathy, lumbar region: M54.16

## 2013-07-19 LAB — GLUCOSE, CAPILLARY
GLUCOSE-CAPILLARY: 96 mg/dL (ref 70–99)
GLUCOSE-CAPILLARY: 126 mg/dL — AB (ref 70–99)

## 2013-07-19 SURGERY — POSTERIOR LUMBAR FUSION 2 LEVEL
Anesthesia: General | Site: Back

## 2013-07-19 MED ORDER — POLYETHYLENE GLYCOL 3350 17 G PO PACK
17.0000 g | PACK | Freq: Every day | ORAL | Status: DC | PRN
  Filled 2013-07-19: qty 1

## 2013-07-19 MED ORDER — LIDOCAINE HCL (CARDIAC) 20 MG/ML IV SOLN
INTRAVENOUS | Status: DC | PRN
  Administered 2013-07-19: 80 mg via INTRAVENOUS

## 2013-07-19 MED ORDER — OXYCODONE-ACETAMINOPHEN 5-325 MG PO TABS
1.0000 | ORAL_TABLET | ORAL | Status: DC | PRN
  Administered 2013-07-20 – 2013-07-23 (×15): 2 via ORAL
  Filled 2013-07-19 (×16): qty 2

## 2013-07-19 MED ORDER — NEOSTIGMINE METHYLSULFATE 10 MG/10ML IV SOLN
INTRAVENOUS | Status: DC | PRN
Start: 2013-07-19 — End: 2013-07-19
  Administered 2013-07-19: 4 mg via INTRAVENOUS

## 2013-07-19 MED ORDER — HYDROMORPHONE HCL PF 1 MG/ML IJ SOLN
INTRAMUSCULAR | Status: AC
  Administered 2013-07-19: 0.5 mg via INTRAVENOUS
  Filled 2013-07-19: qty 1

## 2013-07-19 MED ORDER — MIDAZOLAM HCL 5 MG/5ML IJ SOLN
INTRAMUSCULAR | Status: DC | PRN
  Administered 2013-07-19: 2 mg via INTRAVENOUS

## 2013-07-19 MED ORDER — SODIUM CHLORIDE 0.9 % IR SOLN
Status: DC | PRN
  Administered 2013-07-19: 09:00:00

## 2013-07-19 MED ORDER — EPHEDRINE SULFATE 50 MG/ML IJ SOLN
INTRAMUSCULAR | Status: AC
  Filled 2013-07-19: qty 1

## 2013-07-19 MED ORDER — MIDAZOLAM HCL 2 MG/2ML IJ SOLN
INTRAMUSCULAR | Status: AC
  Filled 2013-07-19: qty 2

## 2013-07-19 MED ORDER — PROPOFOL 10 MG/ML IV BOLUS
INTRAVENOUS | Status: DC | PRN
  Administered 2013-07-19: 150 mg via INTRAVENOUS

## 2013-07-19 MED ORDER — HYDROMORPHONE HCL PF 1 MG/ML IJ SOLN
0.2500 mg | INTRAMUSCULAR | Status: DC | PRN
  Administered 2013-07-19 (×4): 0.5 mg via INTRAVENOUS

## 2013-07-19 MED ORDER — SODIUM CHLORIDE 0.9 % IV SOLN
INTRAVENOUS | Status: DC | PRN
  Administered 2013-07-19: 13:00:00 via INTRAVENOUS

## 2013-07-19 MED ORDER — METOCLOPRAMIDE HCL 5 MG/ML IJ SOLN
INTRAMUSCULAR | Status: AC
  Administered 2013-07-19: 5 mg via INTRAVENOUS
  Filled 2013-07-19: qty 2

## 2013-07-19 MED ORDER — SUCCINYLCHOLINE CHLORIDE 20 MG/ML IJ SOLN
INTRAMUSCULAR | Status: AC
  Filled 2013-07-19: qty 1

## 2013-07-19 MED ORDER — FENTANYL CITRATE 0.05 MG/ML IJ SOLN
INTRAMUSCULAR | Status: AC
  Filled 2013-07-19: qty 5

## 2013-07-19 MED ORDER — BISACODYL 10 MG RE SUPP: 10.0000 mg | Freq: Every day | RECTAL | Status: DC | PRN

## 2013-07-19 MED ORDER — LEVOTHYROXINE SODIUM 175 MCG PO TABS
175.0000 ug | ORAL_TABLET | Freq: Every day | ORAL | Status: DC
  Administered 2013-07-20 – 2013-07-23 (×4): 175 ug via ORAL
  Filled 2013-07-19 (×5): qty 1

## 2013-07-19 MED ORDER — METHOCARBAMOL 500 MG PO TABS
500.0000 mg | ORAL_TABLET | Freq: Four times a day (QID) | ORAL | Status: DC | PRN
  Administered 2013-07-19 – 2013-07-23 (×9): 500 mg via ORAL
  Filled 2013-07-19 (×8): qty 1

## 2013-07-19 MED ORDER — GABAPENTIN 300 MG PO CAPS
300.0000 mg | ORAL_CAPSULE | Freq: Three times a day (TID) | ORAL | Status: DC
  Administered 2013-07-19 – 2013-07-23 (×12): 300 mg via ORAL
  Filled 2013-07-19 (×15): qty 1

## 2013-07-19 MED ORDER — SODIUM CHLORIDE 0.9 % IJ SOLN: 3.0000 mL | INTRAMUSCULAR | Status: DC | PRN

## 2013-07-19 MED ORDER — BUPIVACAINE HCL (PF) 0.5 % IJ SOLN
INTRAMUSCULAR | Status: DC | PRN
  Administered 2013-07-19: 20 mL
  Administered 2013-07-19: 10 mL

## 2013-07-19 MED ORDER — SODIUM CHLORIDE 0.9 % IV SOLN: 250.0000 mL | INTRAVENOUS | Status: DC

## 2013-07-19 MED ORDER — VECURONIUM BROMIDE 10 MG IV SOLR
INTRAVENOUS | Status: DC | PRN
  Administered 2013-07-19 (×5): 1 mg via INTRAVENOUS

## 2013-07-19 MED ORDER — LIDOCAINE-EPINEPHRINE 1 %-1:100000 IJ SOLN
INTRAMUSCULAR | Status: DC | PRN
  Administered 2013-07-19: 10 mL

## 2013-07-19 MED ORDER — ACETAMINOPHEN 650 MG RE SUPP: 650.0000 mg | RECTAL | Status: DC | PRN

## 2013-07-19 MED ORDER — METHOCARBAMOL 500 MG PO TABS
ORAL_TABLET | ORAL | Status: AC
  Filled 2013-07-19: qty 1

## 2013-07-19 MED ORDER — NEOSTIGMINE METHYLSULFATE 10 MG/10ML IV SOLN
INTRAVENOUS | Status: AC
  Filled 2013-07-19: qty 1

## 2013-07-19 MED ORDER — ARTIFICIAL TEARS OP OINT
TOPICAL_OINTMENT | OPHTHALMIC | Status: AC
  Filled 2013-07-19: qty 3.5

## 2013-07-19 MED ORDER — CEFAZOLIN SODIUM-DEXTROSE 2-3 GM-% IV SOLR
INTRAVENOUS | Status: AC
  Filled 2013-07-19: qty 50

## 2013-07-19 MED ORDER — OXYCODONE-ACETAMINOPHEN 5-325 MG PO TABS: 1.0000 | ORAL_TABLET | ORAL | Status: DC | PRN

## 2013-07-19 MED ORDER — ONDANSETRON HCL 4 MG/2ML IJ SOLN
INTRAMUSCULAR | Status: DC | PRN
  Administered 2013-07-19 (×2): 4 mg via INTRAVENOUS

## 2013-07-19 MED ORDER — DOCUSATE SODIUM 100 MG PO CAPS
100.0000 mg | ORAL_CAPSULE | Freq: Two times a day (BID) | ORAL | Status: DC
  Administered 2013-07-20 – 2013-07-23 (×7): 100 mg via ORAL
  Filled 2013-07-19 (×7): qty 1

## 2013-07-19 MED ORDER — METOCLOPRAMIDE HCL 5 MG/ML IJ SOLN
5.0000 mg | Freq: Once | INTRAMUSCULAR | Status: AC
  Administered 2013-07-19: 5 mg via INTRAVENOUS

## 2013-07-19 MED ORDER — FENTANYL CITRATE 0.05 MG/ML IJ SOLN
INTRAMUSCULAR | Status: DC | PRN
  Administered 2013-07-19: 100 ug via INTRAVENOUS
  Administered 2013-07-19 (×2): 50 ug via INTRAVENOUS
  Administered 2013-07-19 (×2): 25 ug via INTRAVENOUS
  Administered 2013-07-19: 100 ug via INTRAVENOUS
  Administered 2013-07-19 (×2): 25 ug via INTRAVENOUS

## 2013-07-19 MED ORDER — PHENYLEPHRINE 40 MCG/ML (10ML) SYRINGE FOR IV PUSH (FOR BLOOD PRESSURE SUPPORT)
PREFILLED_SYRINGE | INTRAVENOUS | Status: AC
  Filled 2013-07-19: qty 10

## 2013-07-19 MED ORDER — FLEET ENEMA 7-19 GM/118ML RE ENEM: 1.0000 | ENEMA | Freq: Once | RECTAL | Status: AC | PRN

## 2013-07-19 MED ORDER — SODIUM CHLORIDE 0.9 % IV SOLN
INTRAVENOUS | Status: DC
  Administered 2013-07-20 – 2013-07-21 (×3): via INTRAVENOUS

## 2013-07-19 MED ORDER — THROMBIN 20000 UNITS EX SOLR
CUTANEOUS | Status: DC | PRN
  Administered 2013-07-19: 09:00:00 via TOPICAL

## 2013-07-19 MED ORDER — ACETAMINOPHEN 325 MG PO TABS: 650.0000 mg | ORAL_TABLET | ORAL | Status: DC | PRN

## 2013-07-19 MED ORDER — MORPHINE SULFATE 2 MG/ML IJ SOLN
1.0000 mg | INTRAMUSCULAR | Status: DC | PRN
  Administered 2013-07-20 – 2013-07-21 (×3): 2 mg via INTRAVENOUS
  Filled 2013-07-19 (×5): qty 1

## 2013-07-19 MED ORDER — ALUM & MAG HYDROXIDE-SIMETH 200-200-20 MG/5ML PO SUSP
30.0000 mL | Freq: Four times a day (QID) | ORAL | Status: DC | PRN
  Administered 2013-07-22: 30 mL via ORAL
  Filled 2013-07-19: qty 30

## 2013-07-19 MED ORDER — ROCURONIUM BROMIDE 50 MG/5ML IV SOLN
INTRAVENOUS | Status: AC
  Filled 2013-07-19: qty 1

## 2013-07-19 MED ORDER — ARTIFICIAL TEARS OP OINT
TOPICAL_OINTMENT | OPHTHALMIC | Status: DC | PRN
  Administered 2013-07-19: 1 via OPHTHALMIC

## 2013-07-19 MED ORDER — PHENOL 1.4 % MT LIQD: 1.0000 | OROMUCOSAL | Status: DC | PRN

## 2013-07-19 MED ORDER — ROCURONIUM BROMIDE 100 MG/10ML IV SOLN
INTRAVENOUS | Status: DC | PRN
  Administered 2013-07-19: 50 mg via INTRAVENOUS

## 2013-07-19 MED ORDER — CEFAZOLIN SODIUM 1-5 GM-% IV SOLN
1.0000 g | Freq: Three times a day (TID) | INTRAVENOUS | Status: AC
  Administered 2013-07-19 – 2013-07-20 (×2): 1 g via INTRAVENOUS
  Filled 2013-07-19 (×2): qty 50

## 2013-07-19 MED ORDER — METHOCARBAMOL 500 MG PO TABS
ORAL_TABLET | ORAL | Status: AC
  Administered 2013-07-19: 500 mg via ORAL
  Filled 2013-07-19: qty 1

## 2013-07-19 MED ORDER — THROMBIN 5000 UNITS EX SOLR
OROMUCOSAL | Status: DC | PRN
  Administered 2013-07-19: 09:00:00 via TOPICAL

## 2013-07-19 MED ORDER — PROPOFOL 10 MG/ML IV BOLUS
INTRAVENOUS | Status: AC
  Filled 2013-07-19: qty 20

## 2013-07-19 MED ORDER — LACTATED RINGERS IV SOLN
INTRAVENOUS | Status: DC | PRN
  Administered 2013-07-19 (×3): via INTRAVENOUS

## 2013-07-19 MED ORDER — SODIUM CHLORIDE 0.9 % IJ SOLN
3.0000 mL | Freq: Two times a day (BID) | INTRAMUSCULAR | Status: DC
  Administered 2013-07-19 – 2013-07-20 (×2): 3 mL via INTRAVENOUS

## 2013-07-19 MED ORDER — MENTHOL 3 MG MT LOZG: 1.0000 | LOZENGE | OROMUCOSAL | Status: DC | PRN

## 2013-07-19 MED ORDER — ONDANSETRON HCL 4 MG/2ML IJ SOLN
4.0000 mg | INTRAMUSCULAR | Status: DC | PRN
  Administered 2013-07-19: 4 mg via INTRAVENOUS
  Filled 2013-07-19: qty 2

## 2013-07-19 MED ORDER — METHOCARBAMOL 500 MG PO TABS: 500.0000 mg | ORAL_TABLET | Freq: Four times a day (QID) | ORAL | Status: DC | PRN

## 2013-07-19 MED ORDER — GLYCOPYRROLATE 0.2 MG/ML IJ SOLN
INTRAMUSCULAR | Status: DC | PRN
  Administered 2013-07-19: 0.6 mg via INTRAVENOUS

## 2013-07-19 MED ORDER — CLONIDINE HCL 0.2 MG PO TABS
0.2000 mg | ORAL_TABLET | Freq: Every evening | ORAL | Status: DC
  Administered 2013-07-22: 0.2 mg via ORAL
  Filled 2013-07-19 (×5): qty 1

## 2013-07-19 MED ORDER — 0.9 % SODIUM CHLORIDE (POUR BTL) OPTIME
TOPICAL | Status: DC | PRN
  Administered 2013-07-19: 1000 mL

## 2013-07-19 MED ORDER — GLYCOPYRROLATE 0.2 MG/ML IJ SOLN
INTRAMUSCULAR | Status: AC
  Filled 2013-07-19: qty 2

## 2013-07-19 MED ORDER — KETOROLAC TROMETHAMINE 15 MG/ML IJ SOLN
15.0000 mg | Freq: Four times a day (QID) | INTRAMUSCULAR | Status: AC
  Administered 2013-07-19 – 2013-07-20 (×5): 15 mg via INTRAVENOUS
  Filled 2013-07-19 (×6): qty 1

## 2013-07-19 MED ORDER — ONDANSETRON HCL 4 MG/2ML IJ SOLN
INTRAMUSCULAR | Status: AC
  Filled 2013-07-19: qty 2

## 2013-07-19 MED ORDER — LISINOPRIL 5 MG PO TABS
5.0000 mg | ORAL_TABLET | Freq: Every morning | ORAL | Status: DC
  Administered 2013-07-19 – 2013-07-20 (×2): 5 mg via ORAL
  Filled 2013-07-19 (×5): qty 1

## 2013-07-19 MED ORDER — METHOCARBAMOL 1000 MG/10ML IJ SOLN
500.0000 mg | Freq: Four times a day (QID) | INTRAVENOUS | Status: DC | PRN
  Filled 2013-07-19: qty 5

## 2013-07-19 MED ORDER — SENNA 8.6 MG PO TABS
1.0000 | ORAL_TABLET | Freq: Two times a day (BID) | ORAL | Status: DC
  Administered 2013-07-20 – 2013-07-23 (×7): 8.6 mg via ORAL
  Filled 2013-07-19 (×9): qty 1

## 2013-07-19 SURGICAL SUPPLY — 75 items
ADH SKN CLS APL DERMABOND .7 (GAUZE/BANDAGES/DRESSINGS) ×1
BAG DECANTER FOR FLEXI CONT (MISCELLANEOUS) ×3 IMPLANT
BLADE 10 SAFETY STRL DISP (BLADE) ×3 IMPLANT
BLADE SURG ROTATE 9660 (MISCELLANEOUS) IMPLANT
BONE MATRIX OSTEOCEL PRO MED (Bone Implant) ×6 IMPLANT
BONE MATRIX OSTEOCEL PRO SM (Bone Implant) ×12 IMPLANT
BUR MATCHSTICK NEURO 3.0 LAGG (BURR) ×3 IMPLANT
CAGE COROENT LG 12X9X23-12 (Cage) ×6 IMPLANT
CANISTER SUCT 3000ML (MISCELLANEOUS) ×3 IMPLANT
CONT SPEC 4OZ CLIKSEAL STRL BL (MISCELLANEOUS) ×6 IMPLANT
COVER BACK TABLE 24X17X13 BIG (DRAPES) IMPLANT
COVER TABLE BACK 60X90 (DRAPES) ×3 IMPLANT
DECANTER SPIKE VIAL GLASS SM (MISCELLANEOUS) ×3 IMPLANT
DERMABOND ADHESIVE PROPEN (GAUZE/BANDAGES/DRESSINGS) ×2
DERMABOND ADVANCED (GAUZE/BANDAGES/DRESSINGS) ×2
DERMABOND ADVANCED .7 DNX12 (GAUZE/BANDAGES/DRESSINGS) ×1 IMPLANT
DERMABOND ADVANCED .7 DNX6 (GAUZE/BANDAGES/DRESSINGS) ×1 IMPLANT
DRAPE C-ARM 42X72 X-RAY (DRAPES) ×6 IMPLANT
DRAPE LAPAROTOMY 100X72X124 (DRAPES) ×3 IMPLANT
DRAPE POUCH INSTRU U-SHP 10X18 (DRAPES) ×3 IMPLANT
DRAPE PROXIMA HALF (DRAPES) IMPLANT
DRSG OPSITE POSTOP 4X10 (GAUZE/BANDAGES/DRESSINGS) ×3 IMPLANT
DURAPREP 26ML APPLICATOR (WOUND CARE) ×3 IMPLANT
ELECT REM PT RETURN 9FT ADLT (ELECTROSURGICAL) ×3
ELECTRODE REM PT RTRN 9FT ADLT (ELECTROSURGICAL) ×1 IMPLANT
GAUZE SPONGE 4X4 16PLY XRAY LF (GAUZE/BANDAGES/DRESSINGS) ×6 IMPLANT
GLOVE BIOGEL PI IND STRL 7.0 (GLOVE) ×1 IMPLANT
GLOVE BIOGEL PI IND STRL 7.5 (GLOVE) ×4 IMPLANT
GLOVE BIOGEL PI IND STRL 8.5 (GLOVE) ×2 IMPLANT
GLOVE BIOGEL PI INDICATOR 7.0 (GLOVE) ×2
GLOVE BIOGEL PI INDICATOR 7.5 (GLOVE) ×8
GLOVE BIOGEL PI INDICATOR 8.5 (GLOVE) ×4
GLOVE ECLIPSE 7.5 STRL STRAW (GLOVE) ×6 IMPLANT
GLOVE ECLIPSE 8.5 STRL (GLOVE) ×6 IMPLANT
GLOVE EXAM NITRILE LRG STRL (GLOVE) IMPLANT
GLOVE EXAM NITRILE MD LF STRL (GLOVE) IMPLANT
GLOVE EXAM NITRILE XL STR (GLOVE) IMPLANT
GLOVE EXAM NITRILE XS STR PU (GLOVE) IMPLANT
GLOVE SURG SS PI 7.0 STRL IVOR (GLOVE) ×12 IMPLANT
GOWN STRL REUS W/ TWL LRG LVL3 (GOWN DISPOSABLE) ×3 IMPLANT
GOWN STRL REUS W/ TWL XL LVL3 (GOWN DISPOSABLE) ×3 IMPLANT
GOWN STRL REUS W/TWL 2XL LVL3 (GOWN DISPOSABLE) ×6 IMPLANT
GOWN STRL REUS W/TWL LRG LVL3 (GOWN DISPOSABLE) ×9
GOWN STRL REUS W/TWL XL LVL3 (GOWN DISPOSABLE) ×9
HEMOSTAT POWDER KIT SURGIFOAM (HEMOSTASIS) IMPLANT
KIT BASIN OR (CUSTOM PROCEDURE TRAY) ×3 IMPLANT
KIT ROOM TURNOVER OR (KITS) ×3 IMPLANT
MILL MEDIUM DISP (BLADE) ×3 IMPLANT
NEEDLE HYPO 22GX1.5 SAFETY (NEEDLE) ×3 IMPLANT
NEEDLE SPNL 18GX3.5 QUINCKE PK (NEEDLE) IMPLANT
NS IRRIG 1000ML POUR BTL (IV SOLUTION) ×3 IMPLANT
PACK LAMINECTOMY NEURO (CUSTOM PROCEDURE TRAY) ×3 IMPLANT
PAD ARMBOARD 7.5X6 YLW CONV (MISCELLANEOUS) ×15 IMPLANT
PATTIES SURGICAL .5 X1 (DISPOSABLE) IMPLANT
PATTIES SURGICAL 1X1 (DISPOSABLE) ×3 IMPLANT
ROD ARM15T PREBENT 95MM (Rod) ×6 IMPLANT
SCREW LOCK (Screw) ×12 IMPLANT
SCREW LOCK 100X5.5X OPN (Screw) ×4 IMPLANT
SCREW POLY 45X6.5 (Screw) ×4 IMPLANT
SCREW POLY 6.5X45MM (Screw) ×12 IMPLANT
SPONGE GAUZE 4X4 12PLY (GAUZE/BANDAGES/DRESSINGS) ×3 IMPLANT
SPONGE LAP 4X18 X RAY DECT (DISPOSABLE) ×3 IMPLANT
SPONGE SURGIFOAM ABS GEL 100 (HEMOSTASIS) ×3 IMPLANT
SUT VIC AB 1 CT1 18XBRD ANBCTR (SUTURE) ×2 IMPLANT
SUT VIC AB 1 CT1 8-18 (SUTURE) ×6
SUT VIC AB 2-0 CP2 18 (SUTURE) ×6 IMPLANT
SUT VIC AB 3-0 SH 8-18 (SUTURE) ×6 IMPLANT
SYR 20ML ECCENTRIC (SYRINGE) ×3 IMPLANT
SYR 3ML LL SCALE MARK (SYRINGE) ×12 IMPLANT
SYR 5ML LL (SYRINGE) IMPLANT
TOWEL OR 17X24 6PK STRL BLUE (TOWEL DISPOSABLE) ×3 IMPLANT
TOWEL OR 17X26 10 PK STRL BLUE (TOWEL DISPOSABLE) ×3 IMPLANT
TRAP SPECIMEN MUCOUS 40CC (MISCELLANEOUS) ×3 IMPLANT
TRAY FOLEY CATH 14FRSI W/METER (CATHETERS) ×3 IMPLANT
WATER STERILE IRR 1000ML POUR (IV SOLUTION) ×3 IMPLANT

## 2013-07-19 NOTE — H&P (Signed)
Caitlin Ho is an 58 y.o. female.   Chief Complaint: Recurrent back and left leg pain HPI: Patient is a 58 year old individual who in December underwent decompression and fusion at the level of L3-L4. She had a recurrent disc herniation at this level. She has significant radicular pain she initially seemed to be getting better however after about 3 months she felt that her back pain and leg symptoms were recurring. A repeat study demonstrated that she had developed substantial degenerative change at L2-3 with progressive collapse of the left-sided foramen and also at L4-5 low effusion with similar findings and significant foraminal stenosis. After careful consideration of her options I advised surgical decompression and stabilization now to include the level above and the level below her fusion that is L2-3 and L4-5. She is admitted for this procedure.  Past Medical History  Diagnosis Date  . Hypertension   . Hypothyroid   . PONV (postoperative nausea and vomiting)   . GERD (gastroesophageal reflux disease)   . Diabetes mellitus without complication     borderline  . Headache(784.0)     sinus  . Cancer     cervical cancer cells    Past Surgical History  Procedure Laterality Date  . Abdominal hysterectomy    . Lumbar disc surgery  10/03/2012    Dr Ellene Route  . Thyroidectomy  2006  . Appendectomy  1978  . Back surgery  02/16/13    3rd since May  . Thyroidectomy  2006    History reviewed. No pertinent family history. Social History:  reports that she has never smoked. She has never used smokeless tobacco. She reports that she does not drink alcohol or use illicit drugs.  Allergies: No Known Allergies  Medications Prior to Admission  Medication Sig Dispense Refill  . cloNIDine (CATAPRES) 0.1 MG tablet Take 0.2 mg by mouth every evening.       . gabapentin (NEURONTIN) 300 MG capsule Take 300 mg by mouth 3 (three) times daily.      Marland Kitchen levothyroxine (SYNTHROID, LEVOTHROID) 175 MCG  tablet Take 175 mcg by mouth daily before breakfast.      . lisinopril (PRINIVIL,ZESTRIL) 5 MG tablet Take 5 mg by mouth every morning.       . methocarbamol (ROBAXIN) 500 MG tablet Take 1 tablet (500 mg total) by mouth every 6 (six) hours as needed for muscle spasms.  60 tablet  3  . oxyCODONE-acetaminophen (PERCOCET/ROXICET) 5-325 MG per tablet Take 1-2 tablets by mouth every 4 (four) hours as needed for moderate pain.  60 tablet  0    Results for orders placed during the hospital encounter of 07/19/13 (from the past 48 hour(s))  GLUCOSE, CAPILLARY     Status: None   Collection Time    07/19/13  6:35 AM      Result Value Ref Range   Glucose-Capillary 96  70 - 99 mg/dL   No results found.  Review of Systems  Musculoskeletal:       Back pain. Radiation to left lower extremity.  Neurological: Positive for weakness.    Blood pressure 116/68, pulse 72, temperature 98.5 F (36.9 C), temperature source Oral, resp. rate 18, SpO2 95.00%. 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

## 2013-07-19 NOTE — Op Note (Signed)
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, spondylosis L4-L5 with lateral recess stenosis, status post arthrodesis L3-L4 December 2014, lumbar radiculopathy, back pain Postoperative diagnosis: Same Procedure: Decompression L2-L3 with decompression of the L2 and the L3 nerve roots with more work than require for simple posterior interbody arthrodesis technique, decompression of L4-L5 with more work than require for simple interbody technique, posterior interbody arthrodesis L4-L5 with peek spacers local autograft and allograft L2-3 and L4-5, revision of hardware with segmental pedicle fixation from L2-L5 posterior lateral arthrodesis L2-3 and L4-5. Surgeon Kristeen Miss M.D. Assistant: Francesca Jewett M.D. Anesthesia: Gen. endotracheal Indications: Caitlin Ho 58 year old visualize had previous decompression fusion she has evidence of significant spondylosis and recurrent disc herniation at L2-L3 on the left side recreating substantial back pain and radiculopathy. In addition she is involved significant lateral recess stenosis at L4-L5 and she's been advised regarding the need for surgery both these levels above and below her previously performed decompression and fusion at L3-L4. The surgery is just completed December of 2014.  Procedure: The patient was brought to the operating room supine on a stretcher. After the smooth induction of general endotracheal anesthesia, she was turned prone. The back was prepped with alcohol DuraPrep. An elliptical incision was made around her previous 2 scars. These were excised. Dissection was carried to the lumbar dorsal fascia to expose the interspinous space at L2-3 above her previously placed fusion and then dissection was carried inferiorly to expose L4-L5. The hardware was exposed. Self-retaining retractor was placed in the wound.  The first part of surgery was started at L2-L3. Here laminotomies  were created removing the appear marginal lamina of L2 out to and including the entire of the facet at L2-3. The common dural tube was exposed. On the left side there was substantial scar tissue which was dissected carefully. As the scar tissue was mobilized and removed, a recurrent disc herniation was encountered. This was carefully excised also. Is partially scarred to the dural tube. Exploration of this area identified the interspace at L2-3. The interspace was explored fully and the disc was removed. An interbody spacer was placed. The osseous side was then opened and decompressed. The endplates were removed. The endplates were prepared for grafting using a toothed curette. Once all the disc was evacuated, a degree 12 mm spacers measuring 23 mm in length and 12 mm in height were placed as a trial into the interspace. These were then filled with autograft with osseous cell 245 the arthrodesis construct. Fluoroscopic guidance was then used to place pedicle screws in L2. These were 6.5 x 45 mm pedicle screws placed under direct visualization using an awl the passage channel into the vertebral body then a tap and then sounding the vertebral body to make sure there was no evidence of cutout. With both screws being placed the lateral gutters which had been previously decorticated were packed with autograft and allograft in the form of osseous cell. Attention was then turned to L4-L5 in near similar procedure was carried out removing the inferior marginal lamina of L4 out to and including the entirety of the facet.  The disc space was explored and there was noted to be substantial lateral recess stenosis for both the L 4 nerve root superiorly and the L5 nerve root inferiorly. These areas were decompressed bilaterally. The interspace was cleared of substantial quantity of severely degenerated disc material. The endplates were clean using a combination of curettes and rongeurs and they were  prepared for grafting a  similar fashion. Here and 12 lordotic cages measuring 12 mm in height were placed into the interspace. Pedicle screws were then placed in L5 in a similar fashion these also were 6.5 x 45 mm pedicle screws. Then 110 mm precontoured rods were placed in between the previous screws and the new screws from L2-L5. The previous rods were removed from L3 and L4. This left a combination of hardware with [employer] hardware at L3-L4 and nuvasive hardware at L2 in L5. Lordosis was placed into the construct by compressing against the L2 screws superiorly and the L5 screws inferiorly. Final radiographs were obtained every adjustment of the position of the left-sided rod to obtain more lordosis was required. Pulses repositioning occurred final radiographs identified good reduction of the alignment with maintenance of a good coronal and sagittal plane. Lateral gutters at L4-5 were then packed with autograft and allograft. Final inspection yielded good decompression of the L2 and the L3 nerve root superiorly and the L4 and L5 nerve roots inferiorly. Interbody spaces were packed well. Hemostasis in all the soft tissues obtained meticulously. Lumbar dorsal fascia was then closed with #1 Vicryl, 2-0 Vicryl was used in the subcutaneous tissues. 20 cc of half percent Marcaine was injected into the paraspinous tissues. 3-0 Vicryl is used to close the subcutaneous or skin. Dermabond was placed on the wound along with an occlusive dressing and blood loss for the procedure was estimated at 325 cc with 110 cc of blood being returned via the Cell Saver.

## 2013-07-19 NOTE — Anesthesia Postprocedure Evaluation (Signed)
  Anesthesia Post-op Note  Patient: Caitlin Ho  Procedure(s) Performed: Procedure(s) with comments: POSTERIOR LUMBAR FUSION 2 LEVEL lumbar two/three and lumbar four/five (N/A) - L2-3 L4-5 Posterior lumbar interbody fusion  Patient Location: PACU  Anesthesia Type:General  Level of Consciousness: awake  Airway and Oxygen Therapy: Patient Spontanous Breathing  Post-op Pain: mild  Post-op Assessment: Post-op Vital signs reviewed  Post-op Vital Signs: Reviewed  Last Vitals:  Filed Vitals:   07/19/13 1415  BP: 137/82  Pulse: 92  Temp:   Resp: 18    Complications: No apparent anesthesia complications

## 2013-07-19 NOTE — Progress Notes (Signed)
Patient ID: Caitlin Ho, female   DOB: Jul 03, 1955, 58 y.o.   MRN: [id number] And came to room to find patient's IV hanging behind the bed about a foot and half off the floor. No IV pole in the room. Informed nurse who rather nonchalantly put to bag onto the patient's been clamped off her IV. Spoke with charge nurse that this was unacceptable and an IV pole wasn't found. Nonetheless patient is rather uncomfortable and had not received Toradol that was ordered a number of hours ago. The patient in fair amount of discomfort with back pain. Lower extremity function appears to be intact.

## 2013-07-19 NOTE — Transfer of Care (Signed)
Immediate Anesthesia Transfer of Care Note  Patient: Caitlin Ho  Procedure(s) Performed: Procedure(s) with comments: POSTERIOR LUMBAR FUSION 2 LEVEL lumbar two/three and lumbar four/five (N/A) - L2-3 L4-5 Posterior lumbar interbody fusion  Patient Location: PACU  Anesthesia Type:General  Level of Consciousness: awake, alert  and oriented  Airway & Oxygen Therapy: Patient Spontanous Breathing and Patient connected to face mask oxygen  Post-op Assessment: Report given to PACU RN and Post -op Vital signs reviewed and stable  Post vital signs: Reviewed and stable  Complications: No apparent anesthesia complications

## 2013-07-19 NOTE — Anesthesia Preprocedure Evaluation (Addendum)
Anesthesia Evaluation  Patient identified by MRN, date of birth, ID band Patient awake    Reviewed: Allergy & Precautions, H&P , NPO status , Patient's Chart, lab work & pertinent test results, reviewed documented beta blocker date and time   History of Anesthesia Complications (+) PONV  Airway Mallampati: II      Dental   Pulmonary  breath sounds clear to auscultation        Cardiovascular hypertension, Pt. on medications Rhythm:Regular Rate:Normal     Neuro/Psych  Headaches,    GI/Hepatic GERD-  ,  Endo/Other  diabetesHypothyroidism   Renal/GU      Musculoskeletal   Abdominal   Peds  Hematology   Anesthesia Other Findings   Reproductive/Obstetrics                          Anesthesia Physical Anesthesia Plan  ASA: III  Anesthesia Plan: General   Post-op Pain Management:    Induction: Intravenous  Airway Management Planned: Oral ETT  Additional Equipment:   Intra-op Plan:   Post-operative Plan: Extubation in OR  Informed Consent: I have reviewed the patients History and Physical, chart, labs and discussed the procedure including the risks, benefits and alternatives for the proposed anesthesia with the patient or authorized representative who has indicated his/her understanding and acceptance.   Dental advisory given  Plan Discussed with: CRNA, Anesthesiologist and Surgeon  Anesthesia Plan Comments:         Anesthesia Quick Evaluation

## 2013-07-20 LAB — GLUCOSE, CAPILLARY
GLUCOSE-CAPILLARY: 132 mg/dL — AB (ref 70–99)
Glucose-Capillary: 133 mg/dL — ABNORMAL HIGH (ref 70–99)
Glucose-Capillary: 110 mg/dL — ABNORMAL HIGH (ref 70–99)
Glucose-Capillary: 126 mg/dL — ABNORMAL HIGH (ref 70–99)

## 2013-07-20 LAB — CBC
HCT: 34.1 % — ABNORMAL LOW (ref 36.0–46.0)
Hemoglobin: 11 g/dL — ABNORMAL LOW (ref 12.0–15.0)
MCH: 28.8 pg (ref 26.0–34.0)
MCHC: 32.3 g/dL (ref 30.0–36.0)
MCV: 89.3 fL (ref 78.0–100.0)
PLATELETS: 259 10*3/uL (ref 150–400)
RBC: 3.82 MIL/uL — AB (ref 3.87–5.11)
RDW: 14.1 % (ref 11.5–15.5)
WBC: 11.4 10*3/uL — ABNORMAL HIGH (ref 4.0–10.5)

## 2013-07-20 LAB — BASIC METABOLIC PANEL
BUN: 8 mg/dL (ref 6–23)
CO2: 28 meq/L (ref 19–32)
CREATININE: 0.83 mg/dL (ref 0.50–1.10)
Calcium: 8 mg/dL — ABNORMAL LOW (ref 8.4–10.5)
Chloride: 107 mEq/L (ref 96–112)
GFR calc Af Amer: >90 mL/min (ref >90)
GFR, EST NON AFRICAN AMERICAN: 78 mL/min — AB (ref >90)
Glucose, Bld: 110 mg/dL — ABNORMAL HIGH (ref 70–99)
Potassium: 4.1 mEq/L (ref 3.7–5.3)
Sodium: 144 mEq/L (ref 137–147)

## 2013-07-20 NOTE — Progress Notes (Signed)
Patient ID: Caitlin Ho, female   DOB: [birth year], 58 y.o.   MRN: [id number] Vital signs are stable. Dressing is had some bleedthrough. Dressing has been changed once or twice. Currently.sitting is intact. Motor function appears intact Patient tolerating medication well IV his hep well Continues to ambulate well Stable

## 2013-07-20 NOTE — Progress Notes (Signed)
BP 103/66 @1913 ; 103/63 @ 2011.

## 2013-07-20 NOTE — Evaluation (Signed)
Occupational Therapy Evaluation Patient Details Name: Caitlin Ho MRN: [id number] DOB: 12-Feb-1956 Today's Date: 07/20/2013    History of Present Illness s/p posterior lumber fusion 2 level; h/o 3 back surgeries in 2014   Clinical Impression   Pt admitted with the above diagnoses and presents with below problem list. Pt will benefit from continued acute OT to address the below listed deficits and maximize independence with basic ADLs prior to d/c. Education on AE and techniques for safe completion of ADLs. Reviewed precautions and observing precautions during functional tasks. Cues needed for hand placement with rw during sit<>stands.     Follow Up Recommendations  No OT follow up    Equipment Recommendations  Other (comment) (none at this time, plan to assess need for 3n1 at next sessi)    Recommendations for Other Services       Precautions / Restrictions Precautions Precautions: Back Required Braces or Orthoses: Spinal Brace Spinal Brace: Lumbar corset;Applied in supine position Restrictions Weight Bearing Restrictions: No      Mobility Bed Mobility Overal bed mobility: Needs Assistance Bed Mobility: Sidelying to Sit   Sidelying to sit: Min assist       General bed mobility comments: min A for scooting legs forward to keep body in correct alignment during sidelying-sit. Pt was lying on side upon arrival but stated she has difficulty with rolling. Pt needed cues for bed mobility technique  Transfers Overall transfer level: Needs assistance Equipment used: Rolling walker (2 wheeled) Transfers: Sit to/from Stand Sit to Stand: Min guard;From elevated surface         General transfer comment: difficulty powering up sit>stand from lower surface, elevated bed for sit>stand    Balance Overall balance assessment: Needs assistance Sitting-balance support: No upper extremity supported;Feet supported Sitting balance-Leahy Scale: Good     Standing balance support:  Bilateral upper extremity supported;During functional activity Standing balance-Leahy Scale: Poor                              ADL Overall ADL's : Needs assistance/impaired Eating/Feeding: Set up;Sitting   Grooming: Set up;Sitting   Upper Body Bathing: Minimal assitance;Cueing for compensatory techniques;Sitting   Lower Body Bathing: Minimal assistance;Cueing for compensatory techniques;Cueing for back precautions;Sit to/from stand   Upper Body Dressing : Minimal assistance;Cueing for compensatory techniques;Sitting   Lower Body Dressing: Min guard;With adaptive equipment;Cueing for compensatory techniques;Cueing for back precautions;Sit to/from stand   Toilet Transfer: Min guard;Ambulation;RW (3n1 over toilet)   Toileting- Clothing Manipulation and Hygiene: Min guard;Sit to/from stand;Sitting/lateral lean   Tub/ Shower Transfer: Min guard;Cueing for safety;Ambulation;3 in 1;Rolling walker   Functional mobility during ADLs: Min guard;Rolling walker General ADL Comments: Education on techniques and AE for safe completion of ADLs with back precautions. Pt needing cues for compensatory techniques during bed mobility and hand placement with rw during transfers.     Vision                     Perception     Praxis      Pertinent Vitals/Pain 8/10 at end of session. Repositioned. Pt stated she will ask for pain meds at lunchtime. Pt c/o feeling very hot, given wet washcloth and cold drink.     Hand Dominance Right   Extremity/Trunk Assessment Upper Extremity Assessment Upper Extremity Assessment: Generalized weakness   Lower Extremity Assessment Lower Extremity Assessment: Defer to PT evaluation       Communication Communication  Communication: No difficulties   Cognition Arousal/Alertness: Awake/alert Behavior During Therapy: WFL for tasks assessed/performed Overall Cognitive Status: Within Functional Limits for tasks assessed                      General Comments       Exercises       Shoulder Instructions      Home Living Family/patient expects to be discharged to:: Private residence Living Arrangements: Spouse/significant other Available Help at Discharge: Family;Available 24 hours/day Type of Home: House Home Access: Stairs to enter [employer] of Steps: 4   Home Layout: One level     Bathroom Shower/Tub: Occupational psychologist: Handicapped height     Home Equipment: Environmental consultant - 2 wheels;Shower seat;Hand held shower head          Prior Functioning/Environment Level of Independence: Independent with assistive device(s)        Comments: used walker sometimes    OT Diagnosis: Generalized weakness;Acute pain   OT Problem List: Decreased strength;Decreased range of motion;Decreased activity tolerance;Impaired balance (sitting and/or standing);Decreased safety awareness;Decreased knowledge of use of DME or AE;Decreased knowledge of precautions;Obesity;Pain   OT Treatment/Interventions: Self-care/ADL training;Therapeutic exercise;DME and/or AE instruction;Energy conservation;Therapeutic activities;Patient/family education;Balance training    OT Goals(Current goals can be found in the care plan section) Acute Rehab OT Goals Patient Stated Goal: I want to get better with rolling in the bed OT Goal Formulation: With patient Time For Goal Achievement: 07/27/13 Potential to Achieve Goals: Good ADL Goals Pt Will Perform Lower Body Bathing: with supervision;with adaptive equipment;sit to/from stand Pt Will Perform Lower Body Dressing: with supervision;with adaptive equipment;sit to/from stand Pt Will Transfer to Toilet: with supervision;ambulating (3n1 over toilet) Pt Will Perform Toileting - Clothing Manipulation and hygiene: with supervision;with adaptive equipment;sit to/from stand Pt Will Perform Tub/Shower Transfer: with supervision;ambulating;shower seat;3 in 1;rolling  walker Additional ADL Goal #1: Pt will perform bed mobility with supervision with HOB flat to prepare for OOB ADLs.  OT Frequency: Min 3X/week   Barriers to D/C:            Co-evaluation              End of Session Equipment Utilized During Treatment: Gait belt;Rolling walker;Back brace  Activity Tolerance: Patient tolerated treatment well Patient left: in chair;with call bell/phone within reach   Time: 1155-1216 OT Time Calculation (min): 21 min Charges:  OT General Charges $OT Visit: 1 Procedure OT Evaluation $Initial OT Evaluation Tier I: 1 Procedure OT Treatments $Self Care/Home Management : 8-22 mins G-Codes:    Hortencia Pilar 10-Aug-2013, 2:32 PM

## 2013-07-20 NOTE — Progress Notes (Signed)
Found Honeycomb dressing to back incision saturated while patient sitting in chair after ambulating with physical therapist. Dr Christella Noa on call paged and made aware of finding. Recommended taking off soaked dressing, and apply ABD pad with gauze. Carried.

## 2013-07-20 NOTE — Clinical Social Work Note (Signed)
CSW consulted for possible placement at time of discharge. CSW now following for possible SNF placement at time of discharge. Thank you for the referral.  Pati Gallo, [place] Social Worker [phone]

## 2013-07-20 NOTE — Evaluation (Signed)
Physical Therapy Evaluation Patient Details Name: Caitlin Ho MRN: [id number] DOB: [birth year] Today's Date: 07/20/2013   History of Present Illness  s/p posterior lumber fusion 2 level; h/o 3 back surgeries in 2014  Clinical Impression  Pt admitted with/for 2 level lumbar decompression and fusion.  Pt currently limited functionally due to the problems listed below.  (see problems list.)  Pt will benefit from PT to maximize function and safety to be able to get home safely with available assist of family.     Follow Up Recommendations No PT follow up    Equipment Recommendations  None recommended by PT    Recommendations for Other Services       Precautions / Restrictions Precautions Precautions: Back Required Braces or Orthoses: Spinal Brace Spinal Brace: Lumbar corset;Applied in supine position Restrictions Weight Bearing Restrictions: No      Mobility  Bed Mobility Overal bed mobility: Needs Assistance Bed Mobility: Sidelying to Sit   Sidelying to sit: Min guard       General bed mobility comments: guarding for safety  Transfers Overall transfer level: Needs assistance Equipment used: Rolling walker (2 wheeled) Transfers: Sit to/from Stand Sit to Stand: Min guard;From elevated surface         General transfer comment: difficulty powering up sit>stand from lower surface, elevated bed for sit>stand  Ambulation/Gait Ambulation/Gait assistance: Min guard Ambulation Distance (Feet): 200 Feet Assistive device: Rolling walker (2 wheeled) Gait Pattern/deviations: Step-through pattern Gait velocity: functional speed Gait velocity interpretation: at or above normal speed for age/gender General Gait Details: generally steady and fluid  Stairs            Wheelchair Mobility    Modified Rankin (Stroke Patients Only)       Balance Overall balance assessment: No apparent balance deficits (not formally assessed) Sitting-balance support: No upper  extremity supported;Feet supported Sitting balance-Leahy Scale: Good     Standing balance support: Bilateral upper extremity supported;During functional activity Standing balance-Leahy Scale: Fair                               Pertinent Vitals/Pain     Home Living Family/patient expects to be discharged to:: Private residence Living Arrangements: Spouse/significant other Available Help at Discharge: Family;Available 24 hours/day Type of Home: House Home Access: Stairs to enter   [employer] of Steps: 4 Home Layout: One level Home Equipment: Walker - 2 wheels;Shower seat;Hand held shower head      Prior Function Level of Independence: Independent with assistive device(s)         Comments: used walker sometimes     Hand Dominance   Dominant Hand: Right    Extremity/Trunk Assessment   Upper Extremity Assessment: Generalized weakness           Lower Extremity Assessment: Overall WFL for tasks assessed         Communication   Communication: No difficulties  Cognition Arousal/Alertness: Awake/alert Behavior During Therapy: WFL for tasks assessed/performed Overall Cognitive Status: Within Functional Limits for tasks assessed                      General Comments General comments (skin integrity, edema, etc.): pt c/o feeling hot at end of session, gave pt wet washcloth and cold drink    Exercises        Assessment/Plan    PT Assessment Patient needs continued PT services  PT Diagnosis Acute pain  PT Problem List Decreased activity tolerance;Decreased mobility;Decreased knowledge of use of DME;Decreased knowledge of precautions;Pain  PT Treatment Interventions Gait training;DME instruction;Stair training;Functional mobility training;Therapeutic activities;Patient/family education   PT Goals (Current goals can be found in the Care Plan section) Acute Rehab PT Goals Patient Stated Goal: I want to get better with rolling in  the bed PT Goal Formulation: With patient Time For Goal Achievement: 07/27/13 Potential to Achieve Goals: Good    Frequency Min 5X/week   Barriers to discharge        Co-evaluation               End of Session Equipment Utilized During Treatment: Back brace Activity Tolerance: Patient tolerated treatment well Patient left: in chair;with call bell/phone within reach Nurse Communication: Mobility status         Time: 1635-1700 PT Time Calculation (min): 25 min   Charges:   PT Evaluation $Initial PT Evaluation Tier I: 1 Procedure PT Treatments $Gait Training: 8-22 mins   PT G CodesTessie Fass Kambre Messner 07/20/2013, 5:09 PM 07/20/2013  [name], PT [phone]  (pager)

## 2013-07-20 NOTE — Progress Notes (Signed)
Received patient into room 4N26 post op from PACU via stretcher. IV Fluid infusing by gravity, tubing half clamped off prior transport. Transferring PACU nurses ambulated Pt from stretcher to bed per her request; hang IV bag hang onto oxygen flow meter, in the setting of no available clean IV pole on the unit. Material paged and Pole requested be sent to floor. Pt denies pain at the time of transfer on assessment; DID NOT CALL FOR DISCOMFORT during shift change and hands off report. Neurosurgeon in to assess; found bag at [facility], off flow meter. Verbalized concern and found UNACCEPTABLE  That patient not having IV pump and pole in room.

## 2013-07-20 NOTE — Progress Notes (Signed)
Catapress due 1800 held due to low blood pressure. Restarted fluid at 136ml/hr as ordered post op.

## 2013-07-21 LAB — GLUCOSE, CAPILLARY
Glucose-Capillary: 100 mg/dL — ABNORMAL HIGH (ref 70–99)
Glucose-Capillary: 95 mg/dL (ref 70–99)
Glucose-Capillary: 126 mg/dL — ABNORMAL HIGH (ref 70–99)
Glucose-Capillary: 95 mg/dL (ref 70–99)

## 2013-07-21 MED FILL — Heparin Sodium (Porcine) Inj 1000 Unit/ML: INTRAMUSCULAR | Qty: 30 | Status: AC

## 2013-07-21 MED FILL — Sodium Chloride IV Soln 0.9%: INTRAVENOUS | Qty: 2000 | Status: AC

## 2013-07-21 NOTE — Progress Notes (Signed)
OT Cancellation Note  Patient Details Name: Caitlin Ho MRN: [id number] DOB: 10-Oct-1955   Cancelled Treatment:    Reason Eval/Treat Not Completed: Patient declined, no reason specified. OT made visit this date for ADL session to address shower transfer and possible need for 3N1. Pt reports that she did well with PT and recently received a bath. Discussed bathroom mobility with pt and pt feels that shower chair will be adequate for her needs; she does not feel that a 3N1 is necessary. Pt reports no further ADL concerns. OT told pt that she can let RN know if she has questions/concerns about ADLs.   Juluis Rainier [phone] 07/21/2013, 2:58 PM

## 2013-07-21 NOTE — Progress Notes (Signed)
Physical Therapy Treatment Patient Details Name: Caitlin Ho MRN: [id number] DOB: 1955/09/05 Today's Date: 07/21/2013    History of Present Illness s/p posterior lumber fusion 2 level; h/o 3 back surgeries in 2014    PT Comments    Progressing well toward goals and home.  Sessions focus on breaking a few bad habits and progressing mobility.   Follow Up Recommendations  No PT follow up     Equipment Recommendations  None recommended by PT    Recommendations for Other Services       Precautions / Restrictions Precautions Precautions: Back Required Braces or Orthoses: Spinal Brace Spinal Brace: Lumbar corset;Applied in supine position    Mobility  Bed Mobility Overal bed mobility: Needs Assistance Bed Mobility: Sidelying to Sit;Sit to Sidelying;Rolling Rolling: Modified independent (Device/Increase time) Sidelying to sit: Supervision     Sit to sidelying: Supervision General bed mobility comments: Reinforced better technique  Transfers Overall transfer level: Needs assistance Equipment used: Rolling walker (2 wheeled) Transfers: Sit to/from Stand Sit to Stand: Supervision         General transfer comment: reinforced safe hand placement  Ambulation/Gait Ambulation/Gait assistance: Supervision Ambulation Distance (Feet): 700 Feet Assistive device: Rolling walker (2 wheeled) Gait Pattern/deviations: WFL(Within Functional Limits) Gait velocity: functional speed Gait velocity interpretation: at or above normal speed for age/gender General Gait Details: generally steady and fluid   Stairs Stairs: Yes Stairs assistance: Supervision Stair Management: One rail Right;Alternating pattern;Forwards Number of Stairs: 3 General stair comments: fluid negotiation of steps with rail  Wheelchair Mobility    Modified Rankin (Stroke Patients Only)       Balance Overall balance assessment: No apparent balance deficits (not formally assessed)   Sitting  balance-Leahy Scale: Good       Standing balance-Leahy Scale: Fair                      Cognition Arousal/Alertness: Awake/alert Behavior During Therapy: WFL for tasks assessed/performed Overall Cognitive Status: Within Functional Limits for tasks assessed                      Exercises      General Comments        Pertinent Vitals/Pain 9/10 pain in area of incision.     Home Living                      Prior Function            PT Goals (current goals can now be found in the care plan section) Acute Rehab PT Goals Time For Goal Achievement: 07/27/13 Potential to Achieve Goals: Good Progress towards PT goals: Progressing toward goals    Frequency  Min 5X/week    PT Plan      Co-evaluation             End of Session Equipment Utilized During Treatment: Back brace Activity Tolerance: Patient tolerated treatment well Patient left: in bed;with call bell/phone within reach     Time: 8502-7741 PT Time Calculation (min): 25 min  Charges:  $Gait Training: 8-22 mins $Self Care/Home Management: 8-22                    G CodesTessie Fass [name] 07/21/2013, 10:42 AM 07/21/2013  [name], PT [phone]  (pager)

## 2013-07-22 LAB — GLUCOSE, CAPILLARY
GLUCOSE-CAPILLARY: 138 mg/dL — AB (ref 70–99)
Glucose-Capillary: 99 mg/dL (ref 70–99)
Glucose-Capillary: 109 mg/dL — ABNORMAL HIGH (ref 70–99)
Glucose-Capillary: 129 mg/dL — ABNORMAL HIGH (ref 70–99)

## 2013-07-22 MED ORDER — ONDANSETRON HCL 4 MG PO TABS
4.0000 mg | ORAL_TABLET | ORAL | Status: DC | PRN
  Administered 2013-07-22 (×2): 4 mg via ORAL
  Filled 2013-07-22 (×2): qty 1

## 2013-07-22 NOTE — Progress Notes (Signed)
IV site to left inner wrist removed at the patients request due to pain.  The site was free from redness or swelling.  Caitlin Ho requests that we leave the IV out until she can talk with her Doctors.  She also requests that we keep all 4 side rails up for her bed to keep pillows from falling onto the floor.

## 2013-07-22 NOTE — Progress Notes (Signed)
Physical Therapy Discharge Patient Details Name: MARDI CANNADY MRN: [id number] DOB: [birth year] Today's Date: 07/22/2013 Time:  -     Patient discharged from PT services secondary to all needs med and pt is now at independent level in a home-like environment.  Will sign off..   07/22/2013  [name], [place] [phone]  (pager)  GP     [name] Toni Demo 07/22/2013, 1:06 PM

## 2013-07-23 LAB — GLUCOSE, CAPILLARY
GLUCOSE-CAPILLARY: 113 mg/dL — AB (ref 70–99)
Glucose-Capillary: 90 mg/dL (ref 70–99)

## 2013-07-23 MED ORDER — DOXYCYCLINE HYCLATE 50 MG PO CAPS: 100.0000 mg | ORAL_CAPSULE | Freq: Two times a day (BID) | ORAL | Status: DC

## 2013-07-23 MED ORDER — METHOCARBAMOL 500 MG PO TABS: 500.0000 mg | ORAL_TABLET | Freq: Four times a day (QID) | ORAL | Status: DC | PRN

## 2013-07-23 MED ORDER — OXYCODONE-ACETAMINOPHEN 5-325 MG PO TABS: 1.0000 | ORAL_TABLET | ORAL | Status: DC | PRN

## 2013-07-23 MED ORDER — GABAPENTIN 300 MG PO CAPS: 300.0000 mg | ORAL_CAPSULE | Freq: Three times a day (TID) | ORAL | Status: DC

## 2013-07-23 NOTE — Discharge Summary (Signed)
Physician Discharge Summary  Patient ID: Caitlin Ho MRN: [id number] DOB/AGE: 1955/10/23 58 y.o.  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 She underwent surgical decompression and stabilization at L2-3 and L4-5 with a singular construct from L2-L5. She tolerated the surgery well however she had substantial wound drainage noted on postoperative day one. This required minimally twice a day dressing changes and remained substantial for the first 3 days. As the drainage decreased incision remained clean and on day 4 she is discharged home with a prescription for doxycycline 100 mg twice a day. Her husband has been instructed as to continue dressing changes.  Consults: None  Significant Diagnostic Studies: None  Treatments: surgery: Decompression L2-3 and L4-5 posterior lumbar interbody arthrodesis with peek spacers local autograft and allograft fixation from L2-L5.  Discharge Exam: Blood pressure 85/48, pulse 89, temperature 98 F (36.7 C), temperature source Oral, resp. rate 16, SpO2 98.00%. The inferior most part of the incision has some open drainage. This is minimal at the current time and easily managed with twice a day dressing changes. Motor function has remained intact.  Disposition: 01-Home or Self  Care  Discharge Instructions   Call MD for:  redness, tenderness, or signs of infection (pain, swelling, redness, odor or green/yellow discharge around incision site)    Complete by:  As directed      Call MD for:  severe uncontrolled pain    Complete by:  As directed      Call MD for:  temperature >100.4    Complete by:  As directed      Diet - low sodium heart healthy    Complete by:  As directed      Discharge instructions    Complete by:  As directed   Okay to shower. Do not apply salves or appointments to incision. No heavy lifting with the upper extremities greater than 15 pounds. May resume driving when not requiring pain medication and patient feels comfortable with doing so.     Increase activity slowly    Complete by:  As directed             Medication List         cloNIDine 0.1 MG tablet  Commonly known as:  CATAPRES  Take 0.2 mg by mouth every evening.     doxycycline 50 MG capsule  Commonly known as:  VIBRAMYCIN  Take 2 capsules (100 mg total) by mouth 2 (two) times daily.     gabapentin 300 MG capsule  Commonly known as:  NEURONTIN  Take 300 mg by mouth 3 (three) times daily.     gabapentin 300 MG capsule  Commonly known as:  NEURONTIN  Take 1 capsule (300 mg total) by mouth 3 (three) times daily.     levothyroxine 175 MCG tablet  Commonly known as:  SYNTHROID, LEVOTHROID  Take 175 mcg by mouth daily before  breakfast.     lisinopril 5 MG tablet  Commonly known as:  PRINIVIL,ZESTRIL  Take 5 mg by mouth every morning.     methocarbamol 500 MG tablet  Commonly known as:  ROBAXIN  Take 1 tablet (500 mg total) by mouth every 6 (six) hours as needed for muscle spasms.     methocarbamol 500 MG tablet  Commonly known as:  ROBAXIN  Take 1 tablet (500 mg total) by mouth every 6 (six) hours as needed for muscle spasms.     oxyCODONE-acetaminophen 5-325 MG per tablet  Commonly known as:  PERCOCET/ROXICET  Take 1-2 tablets by mouth every 4 (four) hours as  needed for moderate pain.     oxyCODONE-acetaminophen 5-325 MG per tablet  Commonly known as:  PERCOCET/ROXICET  Take 1-2 tablets by mouth every 4 (four) hours as needed for moderate pain.         SignedKristeen Miss 07/23/2013, 2:59 PM

## 2013-07-23 NOTE — Progress Notes (Signed)
D/C orders received. Pt and spouse educated on D/C instructions and given prescriptions. Verbalized understanding. Pt taken downstairs by staff via wheelchair.

## 2013-07-23 NOTE — Progress Notes (Signed)
Patient ID: Caitlin Ho, female   DOB: 06-Apr-1955, 58 y.o.   MRN: [id number] Vital signs are stable. Patient has had persistent drainage since surgery. Twice a day dressing changes have been required. Motor function has remained stable with good function in iliopsoas quadricep tibialis anterior and gastroc. Assistance required for mobilization due to size and contain drainage. At this time drainage is minimal and easily managed with twice a day dressing changes. Will discharge patient home today with wound care instructions explained to husband. Will also be maintained on doxycycline for 10 days until wound is completely dry and healed

## 2013-08-27 NOTE — OR Nursing (Signed)
Addendum to scope page 

## 2014-03-04 HISTORY — PX: LUMBAR FUSION: SHX111

## 2014-05-30 ENCOUNTER — Other Ambulatory Visit: Payer: Self-pay | Admitting: Neurological Surgery

## 2014-06-02 ENCOUNTER — Other Ambulatory Visit (HOSPITAL_COMMUNITY): Payer: Self-pay | Admitting: Neurological Surgery

## 2014-06-02 DIAGNOSIS — E278 Other specified disorders of adrenal gland: Secondary | ICD-10-CM

## 2014-06-09 ENCOUNTER — Ambulatory Visit (HOSPITAL_COMMUNITY)
Admission: RE | Admit: 2014-06-09 | Discharge: 2014-06-09 | Disposition: A | Discharge status: Home / Self Care | Payer: BLUE CROSS/BLUE SHIELD | Source: Ambulatory Visit | Attending: Neurological Surgery | Admitting: Neurological Surgery

## 2014-06-09 DIAGNOSIS — M5126 Other intervertebral disc displacement, lumbar region: Secondary | ICD-10-CM | POA: Diagnosis not present

## 2014-06-09 DIAGNOSIS — D3502 Benign neoplasm of left adrenal gland: Secondary | ICD-10-CM | POA: Diagnosis not present

## 2014-06-09 DIAGNOSIS — E279 Disorder of adrenal gland, unspecified: Secondary | ICD-10-CM | POA: Diagnosis present

## 2014-06-09 DIAGNOSIS — E278 Other specified disorders of adrenal gland: Secondary | ICD-10-CM

## 2014-06-20 ENCOUNTER — Encounter (HOSPITAL_COMMUNITY): Payer: Self-pay

## 2014-06-20 ENCOUNTER — Encounter (HOSPITAL_COMMUNITY)
Admission: RE | Admit: 2014-06-20 | Discharge: 2014-06-20 | Disposition: A | Discharge status: Home / Self Care | Payer: BLUE CROSS/BLUE SHIELD | Source: Ambulatory Visit | Attending: Neurological Surgery | Admitting: Neurological Surgery

## 2014-06-20 HISTORY — DX: Other seasonal allergic rhinitis: J30.2

## 2014-06-20 LAB — TYPE AND SCREEN
ABO/RH(D): O POS
Antibody Screen: NEGATIVE

## 2014-06-20 LAB — SURGICAL PCR SCREEN
MRSA, PCR: NEGATIVE
STAPHYLOCOCCUS AUREUS: NEGATIVE

## 2014-06-20 LAB — BASIC METABOLIC PANEL
Anion gap: 12 (ref 5–15)
BUN: 20 mg/dL (ref 6–23)
CO2: 26 mmol/L (ref 19–32)
Calcium: 9.2 mg/dL (ref 8.4–10.5)
Chloride: 99 mmol/L (ref 96–112)
Creatinine, Ser: 0.91 mg/dL (ref 0.50–1.10)
GFR calc Af Amer: 80 mL/min — ABNORMAL LOW (ref >90)
GFR calc non Af Amer: 69 mL/min — ABNORMAL LOW (ref >90)
GLUCOSE: 155 mg/dL — AB (ref 70–99)
Potassium: 4.5 mmol/L (ref 3.5–5.1)
SODIUM: 137 mmol/L (ref 135–145)

## 2014-06-20 LAB — CBC
HEMATOCRIT: 41.8 % (ref 36.0–46.0)
Hemoglobin: 13.7 g/dL (ref 12.0–15.0)
MCH: 29.5 pg (ref 26.0–34.0)
MCHC: 32.8 g/dL (ref 30.0–36.0)
MCV: 90.1 fL (ref 78.0–100.0)
Platelets: 315 10*3/uL (ref 150–400)
RBC: 4.64 MIL/uL (ref 3.87–5.11)
RDW: 13 % (ref 11.5–15.5)
WBC: 16 10*3/uL — ABNORMAL HIGH (ref 4.0–10.5)

## 2014-06-20 NOTE — Progress Notes (Signed)
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  I did not see any notes in anesthesia record.  Patient reported that she did not know what happened, but that her blood pressure was low for a long time and she was slow to awaken.  Patient reported that this happened at an outpatient center.  I called and confirmed [place] as location, I faxed a request for anesthesia records.  "No problems with the last 2 surgeries here.."  Patient reported that she has a patch from Dr Ellene Route to put on prior to surgery.  I instructed patient to inform nurse the name and strength of medication when she arrives.

## 2014-06-20 NOTE — Progress Notes (Deleted)
Patient reported that he is stopping Coumadin 06/21/14, will check PT DOS.

## 2014-06-20 NOTE — Pre-Procedure Instructions (Addendum)
INAYA GILLHAM  06/20/2014   Your procedure is scheduled on:  Monday, April 25.   Report to [facility] Admitting at 5:30 AM.  Call this number if you have problems the morning of surgery: [phone]   Remember:   Do not eat food or drink liquids after midnight Sunday, April 24.   Take these medicines the morning of surgery with A SIP OF WATER: cloNIDine (CATAPRES), gabapentin (NEURONTIN), levothyroxine (SYNTHROID).               Take if needed:methocarbamol (ROBAXIN), oxyCODONE-acetaminophen (PERCOCET/ROXICET).               Stop taking Aspirin, Coumadin, Plavix, Effient and Herbal medications.  Do not take any NSAIDs ZO:[id number],  Advil,Naproxen or any medication containing Aspirin; after today.             .    Do not wear jewelry, make-up or nail polish.  Do not wear lotions, powders, or perfumes.   Do not shave 48 hours prior to surgery.  Do not bring valuables to the hospital.             [facility] is not responsible  for any belongings or valuables.               Contacts, dentures or bridgework may not be worn into surgery.  Leave suitcase in the car. After surgery it may be brought to your room.  For patients admitted to the hospital, discharge time is determined by your treatment team.                Special Instructions: Review  [facility] - Preparing For Surgery.   Please read over the following fact sheets that you were given: Pain Booklet, Coughing and Deep Breathing and Blood Transfusion Information

## 2014-06-21 ENCOUNTER — Encounter (HOSPITAL_COMMUNITY): Payer: Self-pay

## 2014-06-21 NOTE — Progress Notes (Signed)
Anesthesia Chart Review:  Pt is 59 year old female scheduled for L1-2 PLIF on 06/27/2014 with Dr. Ellene Route.   PMH includes: HTN, DM, hypothyroid, GERD. Never smoker. BMI 35. S/p L2-3, L4-5 level PLIF 07/19/13, s/p L3-4 PLIF 02/16/13.  Pt's anesthesia history includes severe post-op N/V.  Pt has nausea medicine in patch form from Dr. Ellene Route that she will be applying DOS. Pt cannot remember name. PAT RN noted malignant hyperthermia in pt's history, but upon further review this is noted as a pertinent negative and not listed as pt problem. Pt received sevoflurane at [facility] in 2014 (records on paper chart) and here at [facility] in 2014 and 5631 without complication.     Preoperative labs reviewed.  WBC count 16. Reported result to [facility] in Dr. Clarice Pole office.   Called and spoke with pt by telephone. Pt has not been feeling ill recently but does c/o allergy symptoms on and off for past week. Reports clear nasal drainage and post nasal drip somewhat improved with zyrtec. Denies cough, rash or discoloration on skin or sx of UTI.   EKG: NSR.   If no changes, I anticipate pt can proceed with surgery as scheduled.   Willeen Cass, FNP-BC [facility] Short Stay Surgical Center/Anesthesiology Phone: [phone] 06/21/2014 4:48 PM

## 2014-06-26 MED ORDER — CEFAZOLIN SODIUM-DEXTROSE 2-3 GM-% IV SOLR
2.0000 g | INTRAVENOUS | Status: AC
  Administered 2014-06-27: 2 g via INTRAVENOUS

## 2014-06-27 ENCOUNTER — Inpatient Hospital Stay (HOSPITAL_COMMUNITY): Payer: BLUE CROSS/BLUE SHIELD

## 2014-06-27 ENCOUNTER — Inpatient Hospital Stay (HOSPITAL_COMMUNITY): Payer: BLUE CROSS/BLUE SHIELD | Admitting: Emergency Medicine

## 2014-06-27 ENCOUNTER — Encounter (HOSPITAL_COMMUNITY): Payer: Self-pay | Admitting: *Deleted

## 2014-06-27 ENCOUNTER — Inpatient Hospital Stay (HOSPITAL_COMMUNITY)
Admission: RE | Admit: 2014-06-27 | Discharge: 2014-07-02 | DRG: 460 | Disposition: A | Discharge status: Home / Self Care | Payer: BLUE CROSS/BLUE SHIELD | Source: Ambulatory Visit | Attending: Neurological Surgery | Admitting: Neurological Surgery

## 2014-06-27 ENCOUNTER — Inpatient Hospital Stay (HOSPITAL_COMMUNITY): Payer: BLUE CROSS/BLUE SHIELD | Admitting: Certified Registered"

## 2014-06-27 ENCOUNTER — Encounter (HOSPITAL_COMMUNITY)
Admission: RE | Disposition: A | Discharge status: Home / Self Care | Payer: BLUE CROSS/BLUE SHIELD | Source: Ambulatory Visit | Attending: Neurological Surgery

## 2014-06-27 DIAGNOSIS — Z79899 Other long term (current) drug therapy: Secondary | ICD-10-CM | POA: Diagnosis not present

## 2014-06-27 DIAGNOSIS — E039 Hypothyroidism, unspecified: Secondary | ICD-10-CM | POA: Diagnosis not present

## 2014-06-27 DIAGNOSIS — M479 Spondylosis, unspecified: Secondary | ICD-10-CM | POA: Diagnosis present

## 2014-06-27 DIAGNOSIS — I10 Essential (primary) hypertension: Secondary | ICD-10-CM | POA: Diagnosis present

## 2014-06-27 DIAGNOSIS — M25551 Pain in right hip: Secondary | ICD-10-CM

## 2014-06-27 DIAGNOSIS — I959 Hypotension, unspecified: Secondary | ICD-10-CM | POA: Diagnosis not present

## 2014-06-27 DIAGNOSIS — M5126 Other intervertebral disc displacement, lumbar region: Principal | ICD-10-CM | POA: Diagnosis present

## 2014-06-27 DIAGNOSIS — Z8541 Personal history of malignant neoplasm of cervix uteri: Secondary | ICD-10-CM | POA: Diagnosis not present

## 2014-06-27 DIAGNOSIS — D72829 Elevated white blood cell count, unspecified: Secondary | ICD-10-CM | POA: Diagnosis not present

## 2014-06-27 DIAGNOSIS — M7989 Other specified soft tissue disorders: Secondary | ICD-10-CM | POA: Diagnosis not present

## 2014-06-27 DIAGNOSIS — M4806 Spinal stenosis, lumbar region: Secondary | ICD-10-CM | POA: Diagnosis present

## 2014-06-27 DIAGNOSIS — M25552 Pain in left hip: Secondary | ICD-10-CM

## 2014-06-27 DIAGNOSIS — G8918 Other acute postprocedural pain: Secondary | ICD-10-CM

## 2014-06-27 DIAGNOSIS — Z981 Arthrodesis status: Secondary | ICD-10-CM | POA: Diagnosis not present

## 2014-06-27 DIAGNOSIS — Z419 Encounter for procedure for purposes other than remedying health state, unspecified: Secondary | ICD-10-CM

## 2014-06-27 DIAGNOSIS — T380X5A Adverse effect of glucocorticoids and synthetic analogues, initial encounter: Secondary | ICD-10-CM | POA: Diagnosis present

## 2014-06-27 DIAGNOSIS — M48061 Spinal stenosis, lumbar region without neurogenic claudication: Secondary | ICD-10-CM | POA: Diagnosis present

## 2014-06-27 DIAGNOSIS — M79605 Pain in left leg: Secondary | ICD-10-CM | POA: Diagnosis present

## 2014-06-27 SURGERY — POSTERIOR LUMBAR FUSION 1 LEVEL
Anesthesia: General | Site: Back

## 2014-06-27 MED ORDER — MIDAZOLAM HCL 2 MG/2ML IJ SOLN
INTRAMUSCULAR | Status: AC
  Filled 2014-06-27: qty 2

## 2014-06-27 MED ORDER — POLYETHYLENE GLYCOL 3350 17 G PO PACK: 17.0000 g | PACK | Freq: Every day | ORAL | Status: DC | PRN

## 2014-06-27 MED ORDER — FLEET ENEMA 7-19 GM/118ML RE ENEM: 1.0000 | ENEMA | Freq: Once | RECTAL | Status: AC | PRN

## 2014-06-27 MED ORDER — BISACODYL 10 MG RE SUPP: 10.0000 mg | Freq: Every day | RECTAL | Status: DC | PRN

## 2014-06-27 MED ORDER — LIDOCAINE HCL (CARDIAC) 20 MG/ML IV SOLN
INTRAVENOUS | Status: AC
  Filled 2014-06-27: qty 5

## 2014-06-27 MED ORDER — NEOSTIGMINE METHYLSULFATE 10 MG/10ML IV SOLN
INTRAVENOUS | Status: DC | PRN
Start: 2014-06-27 — End: 2014-06-27
  Administered 2014-06-27: 4 mg via INTRAVENOUS

## 2014-06-27 MED ORDER — METHOCARBAMOL 500 MG PO TABS
ORAL_TABLET | ORAL | Status: AC
  Administered 2014-06-27: 500 mg via ORAL
  Filled 2014-06-27: qty 1

## 2014-06-27 MED ORDER — OXYCODONE-ACETAMINOPHEN 5-325 MG PO TABS: 1.0000 | ORAL_TABLET | ORAL | Status: DC | PRN

## 2014-06-27 MED ORDER — SODIUM CHLORIDE 0.9 % IJ SOLN
INTRAMUSCULAR | Status: AC
  Filled 2014-06-27: qty 10

## 2014-06-27 MED ORDER — PROPOFOL 10 MG/ML IV BOLUS
INTRAVENOUS | Status: DC | PRN
  Administered 2014-06-27: 150 mg via INTRAVENOUS

## 2014-06-27 MED ORDER — PHENOL 1.4 % MT LIQD: 1.0000 | OROMUCOSAL | Status: DC | PRN

## 2014-06-27 MED ORDER — MORPHINE SULFATE 2 MG/ML IJ SOLN
1.0000 mg | INTRAMUSCULAR | Status: DC | PRN
  Administered 2014-06-27: 4 mg via INTRAVENOUS
  Administered 2014-06-28 – 2014-06-29 (×7): 2 mg via INTRAVENOUS
  Administered 2014-06-30: 4 mg via INTRAVENOUS
  Administered 2014-06-30 (×3): 2 mg via INTRAVENOUS
  Administered 2014-06-30: 4 mg via INTRAVENOUS
  Administered 2014-06-30: 2 mg via INTRAVENOUS
  Filled 2014-06-27 (×3): qty 1
  Filled 2014-06-27: qty 2
  Filled 2014-06-27: qty 1
  Filled 2014-06-27: qty 2
  Filled 2014-06-27 (×6): qty 1
  Filled 2014-06-27: qty 2
  Filled 2014-06-27 (×2): qty 1
  Filled 2014-06-27: qty 2

## 2014-06-27 MED ORDER — ONDANSETRON HCL 4 MG/2ML IJ SOLN: 4.0000 mg | INTRAMUSCULAR | Status: DC | PRN

## 2014-06-27 MED ORDER — METHOCARBAMOL 500 MG PO TABS: 500.0000 mg | ORAL_TABLET | Freq: Four times a day (QID) | ORAL | Status: DC | PRN

## 2014-06-27 MED ORDER — THROMBIN 5000 UNITS EX SOLR
CUTANEOUS | Status: DC | PRN
  Administered 2014-06-27 (×4): 5000 [IU] via TOPICAL

## 2014-06-27 MED ORDER — DIPHENHYDRAMINE HCL 50 MG/ML IJ SOLN
INTRAMUSCULAR | Status: AC
  Filled 2014-06-27: qty 1

## 2014-06-27 MED ORDER — SODIUM CHLORIDE 0.9 % IJ SOLN
3.0000 mL | Freq: Two times a day (BID) | INTRAMUSCULAR | Status: DC
  Administered 2014-06-27 – 2014-07-02 (×8): 3 mL via INTRAVENOUS

## 2014-06-27 MED ORDER — THROMBIN 5000 UNITS EX SOLR
OROMUCOSAL | Status: DC | PRN
  Administered 2014-06-27: 07:00:00 via TOPICAL

## 2014-06-27 MED ORDER — ROCURONIUM BROMIDE 50 MG/5ML IV SOLN
INTRAVENOUS | Status: AC
  Filled 2014-06-27: qty 1

## 2014-06-27 MED ORDER — SODIUM CHLORIDE 0.9 % IV SOLN
INTRAVENOUS | Status: DC
  Administered 2014-06-27 – 2014-06-28 (×3): via INTRAVENOUS

## 2014-06-27 MED ORDER — PROPOFOL 10 MG/ML IV BOLUS
INTRAVENOUS | Status: AC
  Filled 2014-06-27: qty 20

## 2014-06-27 MED ORDER — DIPHENHYDRAMINE HCL 50 MG/ML IJ SOLN
INTRAMUSCULAR | Status: DC | PRN
  Administered 2014-06-27: 25 mg via INTRAVENOUS

## 2014-06-27 MED ORDER — SCOPOLAMINE 1 MG/3DAYS TD PT72
1.0000 | MEDICATED_PATCH | TRANSDERMAL | Status: DC
  Administered 2014-06-27 – 2014-06-30 (×2): 1.5 mg via TRANSDERMAL
  Filled 2014-06-27 (×2): qty 1

## 2014-06-27 MED ORDER — ARTIFICIAL TEARS OP OINT
TOPICAL_OINTMENT | OPHTHALMIC | Status: AC
  Filled 2014-06-27: qty 3.5

## 2014-06-27 MED ORDER — ONDANSETRON HCL 4 MG/2ML IJ SOLN
INTRAMUSCULAR | Status: DC | PRN
  Administered 2014-06-27: 4 mg via INTRAVENOUS

## 2014-06-27 MED ORDER — LIDOCAINE-EPINEPHRINE 1 %-1:100000 IJ SOLN
INTRAMUSCULAR | Status: DC | PRN
  Administered 2014-06-27: 10 mL

## 2014-06-27 MED ORDER — HYDROMORPHONE HCL 1 MG/ML IJ SOLN
INTRAMUSCULAR | Status: AC
  Administered 2014-06-27: 0.5 mg via INTRAVENOUS
  Filled 2014-06-27: qty 2

## 2014-06-27 MED ORDER — KETOROLAC TROMETHAMINE 15 MG/ML IJ SOLN
INTRAMUSCULAR | Status: AC
  Administered 2014-06-27: 11:00:00
  Filled 2014-06-27: qty 1

## 2014-06-27 MED ORDER — ACETAMINOPHEN 650 MG RE SUPP: 650.0000 mg | RECTAL | Status: DC | PRN

## 2014-06-27 MED ORDER — DEXTROSE 5 % IV SOLN
10.0000 mg | INTRAVENOUS | Status: DC | PRN
  Administered 2014-06-27: 15 ug/min via INTRAVENOUS

## 2014-06-27 MED ORDER — MEPERIDINE HCL 25 MG/ML IJ SOLN: 6.2500 mg | INTRAMUSCULAR | Status: DC | PRN

## 2014-06-27 MED ORDER — SODIUM CHLORIDE 0.9 % IV SOLN
250.0000 mL | INTRAVENOUS | Status: DC
  Administered 2014-06-27: 250 mL via INTRAVENOUS

## 2014-06-27 MED ORDER — EPHEDRINE SULFATE 50 MG/ML IJ SOLN
INTRAMUSCULAR | Status: AC
  Filled 2014-06-27: qty 1

## 2014-06-27 MED ORDER — BUPIVACAINE HCL (PF) 0.5 % IJ SOLN
INTRAMUSCULAR | Status: DC | PRN
  Administered 2014-06-27: 10 mL
  Administered 2014-06-27: 20 mL

## 2014-06-27 MED ORDER — HYDROMORPHONE HCL 1 MG/ML IJ SOLN
0.2500 mg | INTRAMUSCULAR | Status: DC | PRN
  Administered 2014-06-27 (×4): 0.5 mg via INTRAVENOUS

## 2014-06-27 MED ORDER — PREDNISONE 20 MG PO TABS: 20.0000 mg | ORAL_TABLET | Freq: Every day | ORAL | Status: DC

## 2014-06-27 MED ORDER — ACETAMINOPHEN 325 MG PO TABS: 650.0000 mg | ORAL_TABLET | ORAL | Status: DC | PRN

## 2014-06-27 MED ORDER — MIDAZOLAM HCL 5 MG/5ML IJ SOLN
INTRAMUSCULAR | Status: DC | PRN
  Administered 2014-06-27: 2 mg via INTRAVENOUS

## 2014-06-27 MED ORDER — HEMOSTATIC AGENTS (NO CHARGE) OPTIME
TOPICAL | Status: DC | PRN
  Administered 2014-06-27: 1 via TOPICAL

## 2014-06-27 MED ORDER — PROMETHAZINE HCL 25 MG/ML IJ SOLN: 6.2500 mg | INTRAMUSCULAR | Status: DC | PRN

## 2014-06-27 MED ORDER — KETOROLAC TROMETHAMINE 15 MG/ML IJ SOLN
15.0000 mg | Freq: Four times a day (QID) | INTRAMUSCULAR | Status: AC
  Administered 2014-06-27 – 2014-06-28 (×5): 15 mg via INTRAVENOUS
  Filled 2014-06-27 (×4): qty 1

## 2014-06-27 MED ORDER — SODIUM CHLORIDE 0.9 % IJ SOLN: 3.0000 mL | INTRAMUSCULAR | Status: DC | PRN

## 2014-06-27 MED ORDER — MENTHOL 3 MG MT LOZG: 1.0000 | LOZENGE | OROMUCOSAL | Status: DC | PRN

## 2014-06-27 MED ORDER — DEXAMETHASONE SODIUM PHOSPHATE 10 MG/ML IJ SOLN
INTRAMUSCULAR | Status: AC
  Filled 2014-06-27: qty 1

## 2014-06-27 MED ORDER — OXYCODONE-ACETAMINOPHEN 5-325 MG PO TABS
ORAL_TABLET | ORAL | Status: AC
  Administered 2014-06-27: 2 via ORAL
  Filled 2014-06-27: qty 2

## 2014-06-27 MED ORDER — LACTATED RINGERS IV SOLN
INTRAVENOUS | Status: DC | PRN
  Administered 2014-06-27 (×2): via INTRAVENOUS

## 2014-06-27 MED ORDER — FENTANYL CITRATE (PF) 250 MCG/5ML IJ SOLN
INTRAMUSCULAR | Status: AC
  Filled 2014-06-27: qty 5

## 2014-06-27 MED ORDER — DEXAMETHASONE SODIUM PHOSPHATE 10 MG/ML IJ SOLN
INTRAMUSCULAR | Status: DC | PRN
  Administered 2014-06-27: 10 mg via INTRAVENOUS

## 2014-06-27 MED ORDER — DEXTROSE 5 % IV SOLN
500.0000 mg | Freq: Four times a day (QID) | INTRAVENOUS | Status: DC | PRN
  Filled 2014-06-27: qty 5

## 2014-06-27 MED ORDER — GLYCOPYRROLATE 0.2 MG/ML IJ SOLN
INTRAMUSCULAR | Status: DC | PRN
  Administered 2014-06-27: 0.6 mg via INTRAVENOUS

## 2014-06-27 MED ORDER — SENNA 8.6 MG PO TABS
1.0000 | ORAL_TABLET | Freq: Two times a day (BID) | ORAL | Status: DC
  Administered 2014-06-27 – 2014-07-02 (×10): 8.6 mg via ORAL
  Filled 2014-06-27 (×10): qty 1

## 2014-06-27 MED ORDER — MIDAZOLAM HCL 2 MG/2ML IJ SOLN: 0.5000 mg | Freq: Once | INTRAMUSCULAR | Status: DC | PRN

## 2014-06-27 MED ORDER — CLONIDINE HCL 0.1 MG PO TABS
0.2000 mg | ORAL_TABLET | Freq: Every evening | ORAL | Status: DC
  Administered 2014-06-27 – 2014-06-30 (×2): 0.2 mg via ORAL
  Filled 2014-06-27 (×3): qty 2

## 2014-06-27 MED ORDER — ROCURONIUM BROMIDE 100 MG/10ML IV SOLN
INTRAVENOUS | Status: DC | PRN
  Administered 2014-06-27: 5 mg via INTRAVENOUS
  Administered 2014-06-27: 50 mg via INTRAVENOUS
  Administered 2014-06-27: 10 mg via INTRAVENOUS

## 2014-06-27 MED ORDER — 0.9 % SODIUM CHLORIDE (POUR BTL) OPTIME
TOPICAL | Status: DC | PRN
  Administered 2014-06-27: 1000 mL

## 2014-06-27 MED ORDER — CEFAZOLIN SODIUM 1-5 GM-% IV SOLN
1.0000 g | Freq: Three times a day (TID) | INTRAVENOUS | Status: AC
  Administered 2014-06-27 – 2014-06-28 (×2): 1 g via INTRAVENOUS
  Filled 2014-06-27 (×2): qty 50

## 2014-06-27 MED ORDER — METHOCARBAMOL 500 MG PO TABS
500.0000 mg | ORAL_TABLET | Freq: Four times a day (QID) | ORAL | Status: DC | PRN
Start: 2014-06-27 — End: 2014-07-02
  Administered 2014-06-27 – 2014-06-30 (×4): 500 mg via ORAL
  Filled 2014-06-27 (×3): qty 1

## 2014-06-27 MED ORDER — PHENYLEPHRINE HCL 10 MG/ML IJ SOLN
INTRAMUSCULAR | Status: DC | PRN
  Administered 2014-06-27 (×4): 80 ug via INTRAVENOUS

## 2014-06-27 MED ORDER — LEVOTHYROXINE SODIUM 50 MCG PO TABS
175.0000 ug | ORAL_TABLET | Freq: Every day | ORAL | Status: DC
  Administered 2014-06-28 – 2014-07-02 (×5): 175 ug via ORAL
  Filled 2014-06-27 (×15): qty 1

## 2014-06-27 MED ORDER — OXYCODONE-ACETAMINOPHEN 5-325 MG PO TABS
1.0000 | ORAL_TABLET | ORAL | Status: DC | PRN
  Administered 2014-06-27 – 2014-07-02 (×19): 2 via ORAL
  Filled 2014-06-27 (×18): qty 2

## 2014-06-27 MED ORDER — SODIUM CHLORIDE 0.9 % IR SOLN
Status: DC | PRN
  Administered 2014-06-27: 07:00:00

## 2014-06-27 MED ORDER — LISINOPRIL 5 MG PO TABS
5.0000 mg | ORAL_TABLET | Freq: Every morning | ORAL | Status: DC
  Administered 2014-06-30 – 2014-07-02 (×3): 5 mg via ORAL
  Filled 2014-06-27 (×4): qty 1

## 2014-06-27 MED ORDER — ALUM & MAG HYDROXIDE-SIMETH 200-200-20 MG/5ML PO SUSP: 30.0000 mL | Freq: Four times a day (QID) | ORAL | Status: DC | PRN

## 2014-06-27 MED ORDER — GABAPENTIN 300 MG PO CAPS
300.0000 mg | ORAL_CAPSULE | Freq: Three times a day (TID) | ORAL | Status: DC
  Administered 2014-06-27 – 2014-07-02 (×16): 300 mg via ORAL
  Filled 2014-06-27 (×17): qty 1

## 2014-06-27 MED ORDER — ARTIFICIAL TEARS OP OINT
TOPICAL_OINTMENT | OPHTHALMIC | Status: DC | PRN
  Administered 2014-06-27: 1 via OPHTHALMIC

## 2014-06-27 MED ORDER — DOCUSATE SODIUM 100 MG PO CAPS
100.0000 mg | ORAL_CAPSULE | Freq: Two times a day (BID) | ORAL | Status: DC
  Administered 2014-06-27 – 2014-07-02 (×10): 100 mg via ORAL
  Filled 2014-06-27 (×10): qty 1

## 2014-06-27 MED ORDER — LIDOCAINE HCL (CARDIAC) 20 MG/ML IV SOLN
INTRAVENOUS | Status: DC | PRN
  Administered 2014-06-27: 20 mg via INTRAVENOUS

## 2014-06-27 MED ORDER — FENTANYL CITRATE (PF) 100 MCG/2ML IJ SOLN
INTRAMUSCULAR | Status: DC | PRN
  Administered 2014-06-27: 25 ug via INTRAVENOUS
  Administered 2014-06-27: 250 ug via INTRAVENOUS
  Administered 2014-06-27: 25 ug via INTRAVENOUS

## 2014-06-27 MED FILL — Heparin Sodium (Porcine) Inj 1000 Unit/ML: INTRAMUSCULAR | Qty: 30 | Status: AC

## 2014-06-27 MED FILL — Sodium Chloride IV Soln 0.9%: INTRAVENOUS | Qty: 1000 | Status: AC

## 2014-06-27 SURGICAL SUPPLY — 62 items
ADH SKN CLS APL DERMABOND .7 (GAUZE/BANDAGES/DRESSINGS) ×1
BAG DECANTER FOR FLEXI CONT (MISCELLANEOUS) ×3 IMPLANT
BLADE CLIPPER SURG (BLADE) IMPLANT
BONE MATRIX OSTEOCEL PRO MED (Bone Implant) ×6 IMPLANT
BUR MATCHSTICK NEURO 3.0 LAGG (BURR) ×3 IMPLANT
CAGE PLIF MAS 9X8X28-4 LUMBAR (Cage) ×6 IMPLANT
CANISTER SUCT 3000ML PPV (MISCELLANEOUS) ×3 IMPLANT
CONNECTOR RELINE 2H 5-MM SM PL (Connector) ×3 IMPLANT
CONNECTOR RELINE ROTATING 5-6 (Connector) ×3 IMPLANT
CONT SPEC 4OZ CLIKSEAL STRL BL (MISCELLANEOUS) ×6 IMPLANT
COVER BACK TABLE 60X90IN (DRAPES) ×3 IMPLANT
DECANTER SPIKE VIAL GLASS SM (MISCELLANEOUS) ×3 IMPLANT
DERMABOND ADVANCED (GAUZE/BANDAGES/DRESSINGS) ×2
DERMABOND ADVANCED .7 DNX12 (GAUZE/BANDAGES/DRESSINGS) ×1 IMPLANT
DRAPE C-ARM 42X72 X-RAY (DRAPES) ×6 IMPLANT
DRAPE LAPAROTOMY 100X72X124 (DRAPES) ×3 IMPLANT
DRAPE POUCH INSTRU U-SHP 10X18 (DRAPES) ×3 IMPLANT
DRAPE PROXIMA HALF (DRAPES) IMPLANT
DURAPREP 26ML APPLICATOR (WOUND CARE) ×3 IMPLANT
ELECT REM PT RETURN 9FT ADLT (ELECTROSURGICAL) ×3
ELECTRODE REM PT RTRN 9FT ADLT (ELECTROSURGICAL) ×1 IMPLANT
GAUZE SPONGE 4X4 12PLY STRL (GAUZE/BANDAGES/DRESSINGS) ×3 IMPLANT
GAUZE SPONGE 4X4 16PLY XRAY LF (GAUZE/BANDAGES/DRESSINGS) IMPLANT
GLOVE BIOGEL PI IND STRL 8.5 (GLOVE) ×2 IMPLANT
GLOVE BIOGEL PI INDICATOR 8.5 (GLOVE) ×4
GLOVE ECLIPSE 7.5 STRL STRAW (GLOVE) ×9 IMPLANT
GLOVE ECLIPSE 8.5 STRL (GLOVE) ×6 IMPLANT
GLOVE EXAM NITRILE LRG STRL (GLOVE) IMPLANT
GLOVE EXAM NITRILE MD LF STRL (GLOVE) IMPLANT
GLOVE EXAM NITRILE XL STR (GLOVE) IMPLANT
GLOVE EXAM NITRILE XS STR PU (GLOVE) IMPLANT
GOWN STRL REUS W/ TWL LRG LVL3 (GOWN DISPOSABLE) IMPLANT
GOWN STRL REUS W/ TWL XL LVL3 (GOWN DISPOSABLE) ×1 IMPLANT
GOWN STRL REUS W/TWL 2XL LVL3 (GOWN DISPOSABLE) ×9 IMPLANT
GOWN STRL REUS W/TWL LRG LVL3 (GOWN DISPOSABLE)
GOWN STRL REUS W/TWL XL LVL3 (GOWN DISPOSABLE) ×3
HEMOSTAT POWDER KIT SURGIFOAM (HEMOSTASIS) ×3 IMPLANT
KIT BASIN OR (CUSTOM PROCEDURE TRAY) ×3 IMPLANT
KIT ROOM TURNOVER OR (KITS) ×3 IMPLANT
LIQUID BAND (GAUZE/BANDAGES/DRESSINGS) IMPLANT
MILL MEDIUM DISP (BLADE) ×3 IMPLANT
NEEDLE HYPO 22GX1.5 SAFETY (NEEDLE) ×3 IMPLANT
NS IRRIG 1000ML POUR BTL (IV SOLUTION) ×3 IMPLANT
PACK LAMINECTOMY NEURO (CUSTOM PROCEDURE TRAY) ×3 IMPLANT
PAD ARMBOARD 7.5X6 YLW CONV (MISCELLANEOUS) ×9 IMPLANT
PATTIES SURGICAL .5 X1 (DISPOSABLE) ×3 IMPLANT
ROD RELINE-O LORD 5.5X40 (Rod) ×6 IMPLANT
SCREW LOCK RELINE 5.5 TULIP (Screw) ×18 IMPLANT
SCREW RELINE-O POLY 6.5X45 (Screw) ×6 IMPLANT
SPONGE LAP 4X18 X RAY DECT (DISPOSABLE) IMPLANT
SPONGE SURGIFOAM ABS GEL 100 (HEMOSTASIS) ×3 IMPLANT
SUT VIC AB 1 CT1 18XBRD ANBCTR (SUTURE) ×1 IMPLANT
SUT VIC AB 1 CT1 8-18 (SUTURE) ×3
SUT VIC AB 2-0 CP2 18 (SUTURE) ×3 IMPLANT
SUT VIC AB 3-0 SH 8-18 (SUTURE) ×3 IMPLANT
SYR 20ML ECCENTRIC (SYRINGE) ×3 IMPLANT
SYR 3ML LL SCALE MARK (SYRINGE) ×12 IMPLANT
TOWEL OR 17X24 6PK STRL BLUE (TOWEL DISPOSABLE) ×3 IMPLANT
TOWEL OR 17X26 10 PK STRL BLUE (TOWEL DISPOSABLE) ×3 IMPLANT
TRAP SPECIMEN MUCOUS 40CC (MISCELLANEOUS) ×3 IMPLANT
TRAY FOLEY CATH 14FRSI W/METER (CATHETERS) ×3 IMPLANT
WATER STERILE IRR 1000ML POUR (IV SOLUTION) ×3 IMPLANT

## 2014-06-27 NOTE — Anesthesia Procedure Notes (Signed)
Procedure Name: Intubation Date/Time: 06/27/2014 7:36 AM Performed by: Gaylene Brooks Pre-anesthesia Checklist: Patient identified, Timeout performed, Emergency Drugs available, Suction available and Patient being monitored Patient Re-evaluated:Patient Re-evaluated prior to inductionOxygen Delivery Method: Circle system utilized Preoxygenation: Pre-oxygenation with 100% oxygen Intubation Type: IV induction Ventilation: Mask ventilation without difficulty and Two handed mask ventilation required Laryngoscope Size: Miller and 2 Grade View: Grade I Tube type: Oral Tube size: 7.0 mm Number of attempts: 1 Airway Equipment and Method: Stylet Placement Confirmation: ETT inserted through vocal cords under direct vision,  breath sounds checked- equal and bilateral,  positive ETCO2 and CO2 detector Secured at: 22 cm Tube secured with: Tape Dental Injury: Teeth and Oropharynx as per pre-operative assessment

## 2014-06-27 NOTE — Transfer of Care (Signed)
Immediate Anesthesia Transfer of Care Note  Patient: Caitlin Ho  Procedure(s) Performed: Procedure(s): Lumbar one -two Posterior lumbar interbody fusion (N/A)  Patient Location: PACU  Anesthesia Type:General  Level of Consciousness: awake, alert  and oriented  Airway & Oxygen Therapy: Patient Spontanous Breathing and Patient connected to nasal cannula oxygen  Post-op Assessment: Report given to RN, Post -op Vital signs reviewed and stable and Patient moving all extremities X 4  Post vital signs: Reviewed and stable  Last Vitals:  Filed Vitals:   06/27/14 0611  BP: 141/82  Pulse: 96  Temp: 36.7 C  Resp: 18    Complications: No apparent anesthesia complications

## 2014-06-27 NOTE — Anesthesia Postprocedure Evaluation (Signed)
  Anesthesia Post-op Note  Patient: Caitlin Ho  Procedure(s) Performed: Procedure(s): Lumbar one -two Posterior lumbar interbody fusion (N/A)  Patient Location: PACU  Anesthesia Type:General  Level of Consciousness: awake, alert , oriented, patient cooperative and responds to stimulation  Airway and Oxygen Therapy: Patient Spontanous Breathing and Patient connected to nasal cannula oxygen  Post-op Pain: mild  Post-op Assessment: Post-op Vital signs reviewed, Patient's Cardiovascular Status Stable, Respiratory Function Stable, Patent Airway, No signs of Nausea or vomiting and Pain level controlled  Post-op Vital Signs: Reviewed and stable  Last Vitals:  Filed Vitals:   06/27/14 1217  BP: 111/34  Pulse: 97  Temp: 36.8 C  Resp: 16    Complications: No apparent anesthesia complications

## 2014-06-27 NOTE — Anesthesia Preprocedure Evaluation (Addendum)
Anesthesia Evaluation  Patient identified by MRN, date of birth, ID band Patient awake    Reviewed: Allergy & Precautions, NPO status , Patient's Chart, lab work & pertinent test results  History of Anesthesia Complications (+) PONV and history of anesthetic complications  Airway Mallampati: II  TM Distance: >3 FB Neck ROM: Full    Dental  (+) Chipped, Poor Dentition, Dental Advisory Given   Pulmonary neg pulmonary ROS,  breath sounds clear to auscultation        Cardiovascular hypertension, Pt. on medications - anginaRhythm:Regular Rate:Normal     Neuro/Psych Chronic back pain: daily narcotic    GI/Hepatic Neg liver ROS, GERD-  Medicated and Controlled,  Endo/Other  diabetes (borderline, no meds)Hypothyroidism Morbid obesity  Renal/GU negative Renal ROS     Musculoskeletal   Abdominal (+) + obese,   Peds  Hematology negative hematology ROS (+)   Anesthesia Other Findings   Reproductive/Obstetrics                            Anesthesia Physical Anesthesia Plan  ASA: III  Anesthesia Plan: General   Post-op Pain Management:    Induction: Intravenous  Airway Management Planned: Oral ETT  Additional Equipment:   Intra-op Plan:   Post-operative Plan: Extubation in OR  Informed Consent: I have reviewed the patients History and Physical, chart, labs and discussed the procedure including the risks, benefits and alternatives for the proposed anesthesia with the patient or authorized representative who has indicated his/her understanding and acceptance.   Dental advisory given  Plan Discussed with: CRNA and Surgeon  Anesthesia Plan Comments: (Plan routine monitors, GETA)        Anesthesia Quick Evaluation

## 2014-06-27 NOTE — Progress Notes (Signed)
Orthopedic Tech Progress Note Patient Details:  Caitlin Ho Dec 22, 1955 [id number]  Patient ID: [name], female   DOB: 04/25/1955, 59 y.o.   MRN: [id number] RN stated that pt already has aspen lumbar fusion brace  Hildred Priest 06/27/2014, 12:53 PM

## 2014-06-27 NOTE — Progress Notes (Signed)
Patient ID: Caitlin Ho, female   DOB: 1956-03-03, 59 y.o.   MRN: [id number] Alert and oriented postoperatively. Motor function in lower extremities is good. She is ambulatory.

## 2014-06-27 NOTE — H&P (Signed)
Caitlin Ho is an 59 y.o. female.   Chief Complaint: Back and bilateral leg pain. HPI: Patient is a 59 year old individual who I've seen over the past several years with increasingly severe back problems. She initially presented with a small disc herniation at the level of L3-L4. This was in the extraforaminal space. She was treated surgically and had minimal improvement however subsequent study demonstrated that she now also had disc herniation at L2-L3. After surgery for this process she seemed to get better however she subsequently developed significant pain again and was found to have progressive degeneration of the L3-4 level she then had a surgical fusion is L3-L4. After the surgery she did not improve substantially and further study demonstrated that she had progressive degeneration at L2-3 above and all 45 low. Because of this/tear she underwent surgical decompression and fusion at L2-L5. Over the past year she's made steady improvement until several weeks ago. Patient developed acute severe centralized back pain with radiation to both lower extremities. An MRI now demonstrates the presence of a large centrally herniated disc at L1-L2 with moderately progressive degenerative changes of the L1-L2 disc space. After careful consideration she's been advised regarding the need to extend her decompression and her fusion to L1-L2. She is now admitted for this procedure.  Past Medical History  Diagnosis Date  . Hypertension   . Hypothyroid   . Diabetes mellitus without complication     borderline  . Headache(784.0)     sinus  . Seasonal allergies   . GERD (gastroesophageal reflux disease)     not on medication  . Cancer     cervical cancer cells whwn patient was 22   . PONV (postoperative nausea and vomiting)     Past Surgical History  Procedure Laterality Date  . Abdominal hysterectomy    . Lumbar disc surgery  10/03/2012    Dr Ellene Route  2 Discectomy  . Thyroidectomy  2006  . Appendectomy   1978  . Back surgery  02/16/13    3rd since May  3 fusions  . Thyroidectomy  2006    No family history on file. Social History:  reports that she has never smoked. She has never used smokeless tobacco. She reports that she does not drink alcohol or use illicit drugs.  Allergies: No Known Allergies  No prescriptions prior to admission    No results found for this or any previous visit (from the past 48 hour(s)). No results found.  Review of Systems  HENT: Negative.   Eyes: Negative.   Respiratory: Negative.   Cardiovascular: Negative.   Gastrointestinal: Negative.   Musculoskeletal: Positive for back pain.  Skin: Negative.   Neurological: Positive for weakness.       Weakness in both lower extremities  Psychiatric/Behavioral: Negative.     There were no vitals taken for this visit. Physical Exam  Constitutional: She is oriented to person, place, and time. She appears well-developed and well-nourished.  HENT:  Head: Normocephalic and atraumatic.  Eyes: Conjunctivae and EOM are normal. Pupils are equal, round, and reactive to light.  Neck: Normal range of motion. Neck supple.  Cardiovascular: Normal rate and regular rhythm.   Respiratory: Effort normal and breath sounds normal.  GI: Soft. Bowel sounds are normal.  Musculoskeletal:  Moderate stiffness across her back. Straight leg raising is positive at 30 in either lower extremity. Patrick's maneuver is negative bilaterally.  Neurological: She is alert and oriented to person, place, and time.  Patellar reflexes are 3+.  Achilles reflexes are absent. Upper extremity reflexes are normal and 2+. Cranial nerve examination is normal.  Skin: Skin is warm and dry.  Psychiatric: She has a normal mood and affect. Her behavior is normal. Judgment and thought content normal.     Assessment/Plan Central disc herniation L1-L2. Status post arthrodesis L2-L5 1 year. Advanced spondylosis L1-L2  Admit for surgical decompression and  stabilization of L1-L2, to tie into  her other hardware below.  [name] 06/27/2014, 3:36 AM

## 2014-06-27 NOTE — Op Note (Signed)
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 Posterior lumbar interbody arthrodesis using peek spacers local autograft and allograft L1-L2. Pedicle screw fixation L1-L2 with tie in L2-L5.  Surgeon: Kristeen Miss First assistant: Earle Gell M.D. Anesthesia: Gen. endotracheal Indications: Caitlin Ho is a 59 year old individual who's had significant problems with back pain and leg pain that came about rather suddenly. She's had previous surgeries including a fusion from L2-L5 a little over a year ago. She is making a good recovery when she was having substantial increase in back pain followed by leg weakness. An MRI demonstrates presence of a large centrally herniated disc at L1-L2 above her fusion with substantial degenerative change in the disc space at L1-L2 that has evolved in the past years time. She was advised regarding surgery to decompress and stabilize L1-L2.  Procedure: The patient was brought to the operating room supine on a stretcher. After the smooth induction of general endotracheal anesthesia, she was turned prone. The back was prepped with alcohol DuraPrep and draped in a sterile fashion. A previously made incision at the superior aspect was reopened. Dissection was carried down to the lumbar dorsal fascia which was opened on either side of the midline. The old hardware was identified and this was used as a landmark for the T12-L1 level. There was enough space at the top of the rod that was protruding above the hardware to allow placement of a coupling connector. The dissection was then carried more superiorly so that bilateral laminotomies removing the entirety  of the facet of L1-L2 could be performed. The ligament was taken up and the superior articular processes were taken down to expose lateral aspects of the disc space at L1-L2. This was done carefully as the conus was noted to be at this level. A discectomy was then undertaken removing the entirety of the disc space at the L1-L2 level. Once the dissection year was completed the subligamentous disc herniation was also removed thus allowing for good relief of pressure on the common dural tube. With this the endplates were curetted in all the cartilaginous material was removed from the interspace. Once this was accomplished interspace was sized for appropriate size spacers and was felt that 8 mm x 28 mm long it for degree lordotic cages would work best in this construct. These were filled with autograft and allograft the allograft being osseous cell for a total volume of 10 mL. The interspace was filled with bone graft for a total of 6 mL. I cages were placed under fluoroscopic guidance. Next pedicle entry sites were chosen at L1 sounding the pedicles tapping to 6.5 diameter and placing 6.5 x 45 mm screws. The rods which had previously been prepared had their side couplers placed and then 40 mm precontoured rods were used to connect the L1 screws to the previous construct. Lateral gutters which had previously been decorticated were then packed with the remainder of autograft and allograft. Care was taken to make sure that the L1 nerve root superiorly the L2 nerve root inferiorly and the common dural tube were well decompressed. Hemostasis was doubly checked and the wound. Once ascertained the lumbar dorsal fascia was closed with #1 Vicryl in interrupted fashion 2-0 Vicryl was used in a subtenons tissues. 20 mL of half percent Marcaine was  injected into the paraspinous muscle and fascia. 3-0 Vicryl was used to close subcutaneous take her skin. Dermabond was placed on the skin. Blood loss for the procedure was estimated at  less than 150 mL.

## 2014-06-28 LAB — CBC
HCT: 33.6 % — ABNORMAL LOW (ref 36.0–46.0)
HEMOGLOBIN: 10.7 g/dL — AB (ref 12.0–15.0)
MCH: 29.2 pg (ref 26.0–34.0)
MCHC: 31.8 g/dL (ref 30.0–36.0)
MCV: 91.6 fL (ref 78.0–100.0)
Platelets: 263 10*3/uL (ref 150–400)
RBC: 3.67 MIL/uL — ABNORMAL LOW (ref 3.87–5.11)
RDW: 13.3 % (ref 11.5–15.5)
WBC: 11.8 10*3/uL — AB (ref 4.0–10.5)

## 2014-06-28 LAB — BASIC METABOLIC PANEL
Anion gap: 6 (ref 5–15)
BUN: 15 mg/dL (ref 6–23)
CHLORIDE: 107 mmol/L (ref 96–112)
CO2: 27 mmol/L (ref 19–32)
Calcium: 7.5 mg/dL — ABNORMAL LOW (ref 8.4–10.5)
Creatinine, Ser: 0.81 mg/dL (ref 0.50–1.10)
GFR calc Af Amer: >90 mL/min (ref >90)
GFR, EST NON AFRICAN AMERICAN: 80 mL/min — AB (ref >90)
Glucose, Bld: 100 mg/dL — ABNORMAL HIGH (ref 70–99)
Potassium: 3.9 mmol/L (ref 3.5–5.1)
Sodium: 140 mmol/L (ref 135–145)

## 2014-06-28 MED ORDER — SODIUM CHLORIDE 0.9 % IV BOLUS (SEPSIS)
1000.0000 mL | Freq: Once | INTRAVENOUS | Status: AC
  Administered 2014-06-28: 1000 mL via INTRAVENOUS

## 2014-06-28 MED ORDER — DEXAMETHASONE 4 MG PO TABS
4.0000 mg | ORAL_TABLET | Freq: Three times a day (TID) | ORAL | Status: DC
  Administered 2014-06-28 – 2014-06-30 (×5): 4 mg via ORAL
  Filled 2014-06-28 (×5): qty 1

## 2014-06-28 NOTE — Progress Notes (Signed)
UR complete.  Ericca Labra RN, MSN 

## 2014-06-28 NOTE — Progress Notes (Signed)
Patient BP has been low throughout the night. Paged on call MD and he states its ok as long as the patient is asymptomatic and heart rate is stable. Patient is alert and oriented and has no c/o pain at this time. Will continue to monitor.

## 2014-06-28 NOTE — Progress Notes (Signed)
Patient ID: Caitlin Ho, female   DOB: [birth year], 59 y.o.   MRN: [id number] Patient has been hypotensive all day 2 fluid boluses for liter each raise it to about 396 systolic. Emig lobe and shows 10.7 today Motor function remains intact Patient feels a bit puny

## 2014-06-28 NOTE — Progress Notes (Addendum)
PT Cancellation Note  Patient Details Name: Caitlin Ho MRN: [id number] DOB: 11-Nov-1955   Cancelled Treatment:    Reason Eval/Treat Not Completed: Medical issues which prohibited therapy;Other (comment) (Low BP of 80/50).  Recheck after bolus of fluids now BP is 78/43.  Ramond Dial 06/28/2014, 11:47 AM   Mee Hives, PT MS Acute Rehab Dept. Number: [phone]

## 2014-06-28 NOTE — Progress Notes (Signed)
OT Cancellation Note  Patient Details Name: Caitlin Ho MRN: [id number] DOB: 06-29-1955   Cancelled Treatment:    Reason Eval/Treat Not Completed: Medical issues which prohibited therapy;Other (comment) (BP 78/42 and symptomatic. Pt with nursing and received a bolus) OT to reattempt as schedule permits.   Hortencia Pilar 06/28/2014, 1:39 PM

## 2014-06-29 NOTE — Evaluation (Signed)
Occupational Therapy Evaluation Patient Details Name: Caitlin Ho MRN: [id number] DOB: 1955-03-18 Today's Date: 06/29/2014    History of Present Illness 59 y.o. s/p lumbar one -two Posterior lumbar interbody fusion.  Pt with previous back surgeries.   Clinical Impression   Pt s/p above. Feel pt will benefit from acute OT to increase independence and strength prior to d/c.     Follow Up Recommendations  No OT follow up;Supervision - Intermittent    Equipment Recommendations  Other (comment) (toilet aide)    Recommendations for Other Services       Precautions / Restrictions Precautions Precautions: Back;Fall Precaution Booklet Issued: No Precaution Comments: educated on back precautions Required Braces or Orthoses: Spinal Brace Spinal Brace: Lumbar corset;Applied in sitting position Restrictions Weight Bearing Restrictions: No      Mobility Bed Mobility Overal bed mobility: Needs Assistance Bed Mobility: Rolling;Sidelying to Sit;Sit to Sidelying Rolling: Modified independent (Device/Increase time) Sidelying to sit: Modified independent (Device/Increase time)     Sit to sidelying: Supervision General bed mobility comments: cue for technique.  Transfers Overall transfer level: Needs assistance Equipment used: Rolling walker (2 wheeled) Transfers: Sit to/from Stand Sit to Stand: Min guard         General transfer comment: cues for technique.     Balance  Min guard for ambulation with RW.                                          ADL Overall ADL's : Needs assistance/impaired     Grooming: Oral care;Sitting;Set up;Supervision/safety           Upper Body Dressing : Set up;Supervision/safety;Sitting   Lower Body Dressing: Maximal assistance;Sit to/from stand   Toilet Transfer: Min guard;Ambulation;RW (bed)           Functional mobility during ADLs: Min guard;Rolling walker General ADL Comments: Ambulated to sink but pt did  not feel she could stand for grooming tasks due to pain legs and feeling as they were going to give away. Discussed incorporating precautions into functional activities. Educated on back brace. Pt asking about toilet hygiene so educated on toilet aide and what she could use for this-also suggested wipes. Discussed AE-pt stating she will have help with dressing.      Vision     Perception     Praxis      Pertinent Vitals/Pain Pain Assessment: 0-10 Pain Score: 7  Pain Location: back and legs Pain Descriptors / Indicators: Sore Pain Intervention(s): Limited activity within patient's tolerance;Repositioned;Monitored during session     Hand Dominance Right   Extremity/Trunk Assessment Upper Extremity Assessment Upper Extremity Assessment: Overall WFL for tasks assessed   Lower Extremity Assessment Lower Extremity Assessment: Defer to PT evaluation       Communication Communication Communication: No difficulties   Cognition Arousal/Alertness: Awake/alert Behavior During Therapy: WFL for tasks assessed/performed Overall Cognitive Status: Within Functional Limits for tasks assessed                     General Comments       Exercises       Shoulder Instructions      Home Living Family/patient expects to be discharged to:: Private residence Living Arrangements: Spouse/significant other Available Help at Discharge: Family;Available 24 hours/day Type of Home: House Home Access: Stairs to enter [employer] of Steps: 5 Entrance Stairs-Rails: Right;Left;Can reach both Home  Layout: One level     Bathroom Shower/Tub: Tub/shower unit;Tub only;Walk-in shower   Bathroom Toilet: Handicapped height     Home Equipment: Walker - 2 wheels;Hand held shower head;Shower seat;Adaptive equipment Adaptive Equipment: Other (Comment) (long brush)        Prior Functioning/Environment Level of Independence: Independent             OT Diagnosis: Generalized  weakness;Acute pain   OT Problem List: Decreased knowledge of precautions;Decreased knowledge of use of DME or AE;Decreased strength;Decreased range of motion;Decreased activity tolerance;Pain   OT Treatment/Interventions: Self-care/ADL training;DME and/or AE instruction;Therapeutic activities;Patient/family education;Balance training    OT Goals(Current goals can be found in the care plan section) Acute Rehab OT Goals Patient Stated Goal: not stated OT Goal Formulation: With patient Time For Goal Achievement: 07/06/14 Potential to Achieve Goals: Good ADL Goals Pt Will Perform Grooming: with modified independence;standing Pt Will Transfer to Toilet: with modified independence;ambulating (elevated toilet ) Pt Will Perform Toileting - Clothing Manipulation and hygiene: with modified independence;with adaptive equipment;sit to/from stand Additional ADL Goal #1: Pt will independently verbalize and maintain 3/3 back precautions.  OT Frequency: Min 2X/week   Barriers to D/C:            Co-evaluation              End of Session Equipment Utilized During Treatment: Gait belt;Rolling walker;Back brace  Activity Tolerance: No increased pain Patient left: in bed;with call bell/phone within reach   Time: 0855-0912 OT Time Calculation (min): 17 min Charges:  OT General Charges $OT Visit: 1 Procedure OT Evaluation $Initial OT Evaluation Tier I: 1 Procedure G-CodesBenito Mccreedy OTR/L [phone] 06/29/2014, 10:37 AM

## 2014-06-29 NOTE — Progress Notes (Signed)
Patient ID: Caitlin Ho, female   DOB: 30-Mar-1955, 59 y.o.   MRN: [id number] Vital signs are stable, motor function is intact. Patient feels much better today than yesterday Blood pressure is much more normalized.

## 2014-06-29 NOTE — Evaluation (Signed)
Physical Therapy Evaluation Patient Details Name: Caitlin Ho MRN: [id number] DOB: [birth year] Today's Date: 06/29/2014   History of Present Illness  Patient is a 59 yo female with pmh that includes HTN, DM, hypothyroid, GERD presents s/p lumbar one -two Posterior lumbar interbody fusion.  Pt with previous back surgeries.  Clinical Impression  Patient demonstrates deficits in functional mobility as indicated below. Will benefit from continued skilled PT to address deficits and maximize function. Will see as indicated and progress as tolerated. At this time, patient with some carryover of restrictions with minimal cues. Educated patient regarding positional changes, pain management, and mobility expectations. Patient receptive at this time.    Follow Up Recommendations No PT follow up;Supervision/Assistance - 24 hour    Equipment Recommendations  None recommended by PT    Recommendations for Other Services       Precautions / Restrictions Precautions Precautions: Back;Fall Precaution Booklet Issued: No Precaution Comments: educated on back precautions Required Braces or Orthoses: Spinal Brace Spinal Brace: Lumbar corset;Applied in sitting position Restrictions Weight Bearing Restrictions: No      Mobility  Bed Mobility Overal bed mobility: Needs Assistance Bed Mobility: Rolling;Sidelying to Sit;Sit to Sidelying Rolling: Supervision Sidelying to sit: Supervision     Sit to sidelying: Supervision General bed mobility comments: VCs for sequencing and technique.  Transfers Overall transfer level: Needs assistance Equipment used: Rolling walker (2 wheeled) Transfers: Sit to/from Stand Sit to Stand: Min guard         General transfer comment: VCs for hand placement and safety (use of RUE to support during descent to chair  Ambulation/Gait Ambulation/Gait assistance: Supervision;Min guard Ambulation Distance (Feet): 190 Feet Assistive device: Rolling walker (2  wheeled) Gait Pattern/deviations: Step-through pattern;Decreased stride length Gait velocity: decreased and guarded Gait velocity interpretation: Below normal speed for age/gender General Gait Details: cautious gait noted, patient reports instability in RLE  Stairs            Wheelchair Mobility    Modified Rankin (Stroke Patients Only)       Balance                                             Pertinent Vitals/Pain Pain Assessment: 0-10 Pain Score: 6  Pain Location: back Pain Descriptors / Indicators: Sore Pain Intervention(s): Limited activity within patient's tolerance;Repositioned;Monitored during session    Home Living Family/patient expects to be discharged to:: Private residence Living Arrangements: Spouse/significant other Available Help at Discharge: Family;Available 24 hours/day Type of Home: House Home Access: Stairs to enter Entrance Stairs-Rails: Right;Left;Can reach both Entrance Stairs-Number of Steps: 5 Home Layout: One level Home Equipment: Walker - 2 wheels;Hand held shower head;Shower seat;Adaptive equipment      Prior Function Level of Independence: Independent               Hand Dominance   Dominant Hand: Right    Extremity/Trunk Assessment   Upper Extremity Assessment: Overall WFL for tasks assessed           Lower Extremity Assessment: Generalized weakness;RLE deficits/detail RLE Deficits / Details: right hip pain and quad weakness        Communication   Communication: No difficulties  Cognition Arousal/Alertness: Awake/alert Behavior During Therapy: WFL for tasks assessed/performed Overall Cognitive Status: Within Functional Limits for tasks assessed  General Comments      Exercises        Assessment/Plan    PT Assessment Patient needs continued PT services  PT Diagnosis Difficulty walking;Acute pain   PT Problem List Decreased strength;Decreased range of  motion;Decreased activity tolerance;Decreased balance;Decreased mobility;Obesity;Pain  PT Treatment Interventions DME instruction;Gait training;Stair training;Functional mobility training;Therapeutic activities;Therapeutic exercise;Balance training;Patient/family education   PT Goals (Current goals can be found in the Care Plan section) Acute Rehab PT Goals Patient Stated Goal: not stated PT Goal Formulation: With patient Time For Goal Achievement: 07/13/14 Potential to Achieve Goals: Good    Frequency Min 5X/week   Barriers to discharge        Co-evaluation               End of Session Equipment Utilized During Treatment: Gait belt;Back brace Activity Tolerance: Patient tolerated treatment well;Patient limited by fatigue;Patient limited by pain Patient left: in chair;with call bell/phone within reach Nurse Communication: Mobility status         Time: 0937-1001 PT Time Calculation (min) (ACUTE ONLY): 24 min   Charges:   PT Evaluation $Initial PT Evaluation Tier I: 1 Procedure PT Treatments $Self Care/Home Management: 8-22   PT G CodesDuncan Dull 07-14-2014, 11:32 AM Alben Deeds, PT DPT  [phone]

## 2014-06-29 NOTE — Progress Notes (Signed)
Patient with 10/10 pain most of the night. She states it is not the usual nerve pain that she has experienced in the past. Pain is radiating down both legs and is also in her stomach. She is sleeping very little and is moaning in her bed. She has changed positions but not much seems to work. Pt's BP still soft this evening so full doses of her IV meds are difficult to give. Will continue to monitor. Martavion Couper, Rande Brunt, RN

## 2014-06-30 ENCOUNTER — Inpatient Hospital Stay (HOSPITAL_COMMUNITY): Payer: BLUE CROSS/BLUE SHIELD

## 2014-06-30 MED ORDER — DEXAMETHASONE 2 MG PO TABS
2.0000 mg | ORAL_TABLET | Freq: Three times a day (TID) | ORAL | Status: DC
  Administered 2014-06-30 – 2014-07-02 (×7): 2 mg via ORAL
  Filled 2014-06-30 (×7): qty 1

## 2014-06-30 NOTE — Progress Notes (Signed)
OT Cancellation Note  Patient Details Name: Caitlin Ho MRN: [id number] DOB: 07-27-1955   Cancelled Treatment:    Reason Eval/Treat Not Completed: Pain limiting ability to participate. Pt in bed with 10/10 pain and recently received pain medication; now resting comfortably and declining therapy at this time. OT will re-attempt tomorrow as available.   Villa Herb M   Cyndie Chime, OTR/L Occupational Therapist [phone] (pager)  06/30/2014, 2:12 PM

## 2014-06-30 NOTE — Progress Notes (Signed)
PT Cancellation Note  Patient Details Name: Caitlin Ho MRN: [id number] DOB: 07/10/1955   Cancelled Treatment:      Reason Eval/Treat Not Completed: Pain limiting ability to participate. Pt in bed with 10/10 pain and recently received pain medication; now resting comfortably and declining therapy at this time   Duncan Dull 06/30/2014, 2:54 PM  Alben Deeds, [place] DPT  [phone]

## 2014-06-30 NOTE — Progress Notes (Signed)
Patient ID: Caitlin Ho, female   DOB: 1955/06/27, 59 y.o.   MRN: [id number] Vital signs are stable Patient complains of new right-sided pain Notes "worse pain than ever before" Motor function appears intact Incision is clean and dry Will obtain CT scan of lumbar spine to check hardware alignment and any other anatomic abnormalities. Patient has been on Decadron will slowly start to taper.

## 2014-07-01 ENCOUNTER — Inpatient Hospital Stay (HOSPITAL_COMMUNITY): Payer: BLUE CROSS/BLUE SHIELD

## 2014-07-01 DIAGNOSIS — M7989 Other specified soft tissue disorders: Secondary | ICD-10-CM

## 2014-07-01 DIAGNOSIS — M4806 Spinal stenosis, lumbar region: Secondary | ICD-10-CM

## 2014-07-01 DIAGNOSIS — D72829 Elevated white blood cell count, unspecified: Secondary | ICD-10-CM

## 2014-07-01 DIAGNOSIS — M25551 Pain in right hip: Secondary | ICD-10-CM

## 2014-07-01 LAB — BRAIN NATRIURETIC PEPTIDE: B Natriuretic Peptide: 72.3 pg/mL (ref 0.0–100.0)

## 2014-07-01 LAB — COMPREHENSIVE METABOLIC PANEL
ALBUMIN: 2.7 g/dL — AB (ref 3.5–5.2)
ALT: 15 U/L (ref 0–35)
AST: 15 U/L (ref 0–37)
Alkaline Phosphatase: 67 U/L (ref 39–117)
Anion gap: 8 (ref 5–15)
BILIRUBIN TOTAL: 0.3 mg/dL (ref 0.3–1.2)
BUN: 19 mg/dL (ref 6–23)
CALCIUM: 8.6 mg/dL (ref 8.4–10.5)
CO2: 29 mmol/L (ref 19–32)
Chloride: 104 mmol/L (ref 96–112)
Creatinine, Ser: 0.82 mg/dL (ref 0.50–1.10)
GFR calc non Af Amer: 79 mL/min — ABNORMAL LOW (ref >90)
Glucose, Bld: 116 mg/dL — ABNORMAL HIGH (ref 70–99)
Potassium: 4.2 mmol/L (ref 3.5–5.1)
SODIUM: 141 mmol/L (ref 135–145)
Total Protein: 5.9 g/dL — ABNORMAL LOW (ref 6.0–8.3)

## 2014-07-01 LAB — CBC
HCT: 34.9 % — ABNORMAL LOW (ref 36.0–46.0)
Hemoglobin: 11.3 g/dL — ABNORMAL LOW (ref 12.0–15.0)
MCH: 29.3 pg (ref 26.0–34.0)
MCHC: 32.4 g/dL (ref 30.0–36.0)
MCV: 90.4 fL (ref 78.0–100.0)
Platelets: 369 10*3/uL (ref 150–400)
RBC: 3.86 MIL/uL — ABNORMAL LOW (ref 3.87–5.11)
RDW: 13.3 % (ref 11.5–15.5)
WBC: 17.2 10*3/uL — AB (ref 4.0–10.5)

## 2014-07-01 LAB — TSH: TSH: 0.047 u[IU]/mL — ABNORMAL LOW (ref 0.350–4.500)

## 2014-07-01 MED ORDER — SODIUM CHLORIDE 0.9 % IV BOLUS (SEPSIS)
1000.0000 mL | Freq: Once | INTRAVENOUS | Status: AC
  Administered 2014-07-01: 1000 mL via INTRAVENOUS

## 2014-07-01 NOTE — Progress Notes (Signed)
UR complete.  Lavaun Greenfield RN, MSN 

## 2014-07-01 NOTE — Consult Note (Signed)
[facility] Medical Consultation  Caitlin Ho EAV:[id number] DOB: [birth year] DOA: 06/27/2014 PCP: Enid Skeens., MD   Requesting physician: Kristeen Miss, neurosurgery Date of consultation: 07/01/14 Reason for consultation: Swelling  Impression/Recommendations Active Problems:   Lumbar stenosis: Status post lumbar fusion.  " swelling"sensation: BNP normal. Although albumin slightly low, which her appetite has been good. I suspect that the overall sensation of swelling is from the fact that she's been on some dose of steroids for the past 2 weeks. Once these can be tapered off, swelling should resolve.  Hypotension: Patient's by mouth intake has been good. Suspect combination of chronic home medications including neuropathic medications, muscle relaxers and pain medications.  Right hip pain: Patient able to have some external obturation. Doubt very much this is anything significant, but will check a right hip x-ray for completeness sake  Right lower extremity swelling: Checking lower extremity Doppler. See above.  Low TSH: Patient on Synthroid. We'll check free T4 and unless this is markedly elevated, likely this is more from hospitalization and should be rechecked in 6 weeks  Leukocytosis: Recheck labs today and found white count of 17. This is almost certainly from steroids. No signs of infection. Unless patient has fever, would not work this up   I will followup again tomorrow. Please contact me if I can be of assistance in the meanwhile. Thank you for this consultation.  Chief Complaint: Right hip pain  HPI:  Patient is a 59 year old female with past mental history of hypothyroidism who was at a number of back pain issues and has been through a number of lumbar fusions. Lately patient had developed acute severe centralized back pain with radiation down both legs and an MRI demonstrate presence of a large centrally herniated disc at L1-L2 and patient was admitted on 4/25 for  decompression and fusion.  Postop, patient has been doing relatively well. It was noted that she's been having episodes of hypotension, treated due to her medications. When working with physical therapy loss 24 hours, it was noted that her right lower extremity was quite swollen. Patient is suffering complaining of generalized swelling. Neurosurgery requested hospitalist consultation for workup of this. In addition, when I saw the patient, her complaint was less of swelling him more of right hip pain, most notably when she gets out of bed and tries to walk with physical therapy. She states that this is new and was not an issue before.  Review of Systems:  Patient's main complaint is of right sided hip pain. She she feels that especially so when trying to bear weight. Feels like pain goes down to about her knee. She also complains of some generalized swelling most notably also in her right leg.  She denies any headaches, vision changes, chest pain, palpitations, shortness of breath, wheeze, cough, abdominal pain, hematuria or dysuria. Review of systems is otherwise negative  Past Medical History  Diagnosis Date  . Hypertension   . Hypothyroid   . Diabetes mellitus without complication     borderline  . Headache(784.0)     sinus  . Seasonal allergies   . GERD (gastroesophageal reflux disease)     not on medication  . Cancer     cervical cancer cells whwn patient was 22   . PONV (postoperative nausea and vomiting)     06/20/14 patient reported that BP dropped real low and she had N/V  and was slow to awaken when she had surgery at an outpatient center.   Past Surgical History  Procedure Laterality Date  . Abdominal hysterectomy    . Lumbar disc surgery  10/03/2012    Dr Ellene Route  2 Discectomy  . Thyroidectomy  2006  . Appendectomy  1978  . Back surgery  02/16/13    3rd since May  3 fusions  . Thyroidectomy  2006   Social History:  reports that she has never smoked. She has never used  smokeless tobacco. She reports that she does not drink alcohol or use illicit drugs. prior to this, she was able to ablate somewhat well. Lives at home with her husband  No Known Allergies Family history: Patient states no medical problems really run in her family  Prior to Admission medications   Medication Sig Start Date End Date Taking? Authorizing Provider  cloNIDine (CATAPRES) 0.1 MG tablet Take 0.2 mg by mouth every evening.    Yes Historical Provider, MD  gabapentin (NEURONTIN) 300 MG capsule Take 1 capsule (300 mg total) by mouth 3 (three) times daily. 07/23/13  Yes Kristeen Miss, MD  levothyroxine (SYNTHROID, LEVOTHROID) 175 MCG tablet Take 175 mcg by mouth daily before breakfast.   Yes Historical Provider, MD  lisinopril (PRINIVIL,ZESTRIL) 5 MG tablet Take 5 mg by mouth every morning.    Yes Historical Provider, MD  meloxicam (MOBIC) 15 MG tablet Take 15 mg by mouth daily.   Yes Historical Provider, MD  methocarbamol (ROBAXIN) 500 MG tablet Take 1 tablet (500 mg total) by mouth every 6 (six) hours as needed for muscle spasms. 02/19/13  Yes Kristeen Miss, MD  methocarbamol (ROBAXIN) 500 MG tablet Take 1 tablet (500 mg total) by mouth every 6 (six) hours as needed for muscle spasms. 07/23/13  Yes Kristeen Miss, MD  oxyCODONE-acetaminophen (PERCOCET/ROXICET) 5-325 MG per tablet Take 1-2 tablets by mouth every 4 (four) hours as needed for moderate pain. 07/23/13  Yes Kristeen Miss, MD  predniSONE (DELTASONE) 20 MG tablet Take 20 mg by mouth daily with breakfast. $0mg  for 4 days  30 mg for 3 days- started 06/20/14,  Thursday 06/23/14 will begin 20 mg and remain on 20 mg until surgery.   Yes Historical Provider, MD  scopolamine (TRANSDERM-SCOP) 1 MG/3DAYS Place 1 patch onto the skin every 3 (three) days.   Yes Historical Provider, MD  doxycycline (VIBRAMYCIN) 50 MG capsule Take 2 capsules (100 mg total) by mouth 2 (two) times daily. 07/23/13   Kristeen Miss, MD  ibuprofen (ADVIL,MOTRIN) 200 MG tablet  Take 200 mg by mouth every 6 (six) hours as needed for moderate pain.    Historical Provider, MD  oxyCODONE-acetaminophen (PERCOCET/ROXICET) 5-325 MG per tablet Take 1-2 tablets by mouth every 4 (four) hours as needed for moderate pain. 02/19/13   Kristeen Miss, MD  polyethylene glycol (MIRALAX / GLYCOLAX) packet Take 17 g by mouth daily as needed.    Historical Provider, MD   Physical Exam: Blood pressure 123/70, pulse 85, temperature 99.5 F (37.5 C), temperature source Oral, resp. rate 20, height 5\' 6"  (1.676 m), weight 97.523 kg (215 lb), SpO2 94 %. Filed Vitals:   07/01/14 1711  BP: 123/70  Pulse: 85  Temp: 99.5 F (37.5 C)  Resp: 20     General:  Alert and oriented 3, mild distress secondary to pain  Eyes: Sclera nonicteric, extraocular movements are intact  ENT: Normocephalic major medical mucus remains are moist  Neck: No JVD  Cardiovascular: Regular rate and rhythm, S1-S2  Respiratory: Clear to auscultation bilaterally  Abdomen: Soft, nontender, nondistended, positive bowel sounds  Skin: No  skin breaks, tears or lesions  Musculoskeletal: Limited right lower extremity straight leg raising, able to externally rotate  Psychiatric: Patient is appropriate, no evidence of psychoses  Neurologic: No liver deficits  Labs on Admission:  Basic Metabolic Panel:  Recent Labs Lab 06/28/14 1445 07/01/14 1607  NA 140 141  K 3.9 4.2  CL 107 104  CO2 27 29  GLUCOSE 100* 116*  BUN 15 19  CREATININE 0.81 0.82  CALCIUM 7.5* 8.6   Liver Function Tests:  Recent Labs Lab 07/01/14 1607  AST 15  ALT 15  ALKPHOS 67  BILITOT 0.3  PROT 5.9*  ALBUMIN 2.7*   No results for input(s): LIPASE, AMYLASE in the last 168 hours. No results for input(s): AMMONIA in the last 168 hours. CBC:  Recent Labs Lab 06/28/14 1445 07/01/14 1607  WBC 11.8* 17.2*  HGB 10.7* 11.3*  HCT 33.6* 34.9*  MCV 91.6 90.4  PLT 263 369   Cardiac Enzymes: No results for input(s): CKTOTAL,  CKMB, CKMBINDEX, TROPONINI in the last 168 hours. BNP: Invalid input(s): POCBNP CBG: No results for input(s): GLUCAP in the last 168 hours.  Radiological Exams on Admission: Ct Lumbar Spine Wo Contrast  06/30/2014   CLINICAL DATA:  59 year old female status post third spinal fusion. Increasing lumbar back pain. Initial encounter.  EXAM: CT LUMBAR SPINE WITHOUT CONTRAST  TECHNIQUE: Multidetector CT imaging of the lumbar spine was performed without intravenous contrast administration. Multiplanar CT image reconstructions were also generated.  COMPARISON:  CT Abdomen 06/09/2014.  Lumbar MRI 05/19/2014.  FINDINGS: Same numbering system as on 05/19/2014 designating normal lumbar segmentation.  Stable visualized abdominal viscera, including left adrenal adenoma. No free fluid or retroperitoneal fluid or stranding identified.  Postoperative changes to the posterior paraspinal soft tissues. Trace subcutaneous gas at the T11-T12 level. Small volume postoperative fluid suspected overlying the lumbar spinous processes.  Visible sacrum and SI joints are intact. Stable vertebral height and alignment with straightening of lordosis.  T11-T12:  Mild disc bulge.  No significant stenosis.  T12-L1:  Stable mild facet hypertrophy.  No stenosis.  L1-L2: New L1 transpedicular screws and interbody implant at this level. Hardware intact. Sequelae of L1 laminectomy. Underlying chronic endplate osteophytes. L2 transpedicular hardware appears stable.  L2-L3: Stable CT appearance status post decompression and fusion with posterior and interbody hardware. Solid interbody arthrodesis.  L3-L4: Stable CT appearance status post decompression and fusion with posterior and interbody hardware. Solid interbody arthrodesis.  L4-L5: Stable CT appearance status post decompression and fusion with posterior and interbody hardware. Solid interbody arthrodesis.  L5-S1: Stable mild to moderate facet hypertrophy. Stable disc bulge.  IMPRESSION: 1.  Cephalad extension of posterior and interbody fusion to L1-L2 with laminectomy. No adverse hardware features. Expected postoperative soft tissue changes by CT. 2. Stable postoperative appearance of the lumbar spine elsewhere, with solid interbody arthrodesis from the L2 to the L5 level.   Electronically Signed   By: Genevie Ann M.D.   On: 06/30/2014 12:33    EKG: None  Time spent: 45 minutes  [facility] Pager [phone]  If 7PM-7AM, please contact night-coverage [email or web address] Password [facility] 07/01/2014, 6:47 PM

## 2014-07-01 NOTE — Progress Notes (Signed)
Patient ID: Caitlin Ho, female   DOB: 27-Nov-1955, 59 y.o.   MRN: [id number] Patient is having pain and swelling in right leg and also on the right back. CT performed yesterday demonstrates that hardware is in good position no extravasation of graft. Incision remains clean and dry Right leg has modest edema compared to left leg. No Homans sign Will obtain venous Doppler of lower extremity rule out DVT Have also asked hospitalist see patient in reference to recurring hypotension patient's blood pressure has been the 80s all day today. She does not appear to be very symptomatic from hypotension though

## 2014-07-01 NOTE — Progress Notes (Signed)
Occupational Therapy Treatment Patient Details Name: Caitlin Ho MRN: [id number] DOB: 1955/07/22 Today's Date: 07/01/2014    History of present illness Patient is a 59 yo female with pmh that includes HTN, DM, hypothyroid, GERD presents s/p lumbar one -two Posterior lumbar interbody fusion.  Pt with previous back surgeries.   OT comments  Pt at adequate level to d/c home. Pt without further questions or needs at time.    Follow Up Recommendations  No OT follow up;Supervision - Intermittent    Equipment Recommendations  Other (comment)    Recommendations for Other Services      Precautions / Restrictions Precautions Precautions: Back;Fall Precaution Booklet Issued: No Precaution Comments: able to recall 3 out 3 precautions Required Braces or Orthoses: Spinal Brace Spinal Brace: Lumbar corset;Applied in sitting position Restrictions Weight Bearing Restrictions: No       Mobility Bed Mobility               General bed mobility comments: in chair on arrival  Transfers Overall transfer level: Needs assistance Equipment used: Rolling walker (2 wheeled) Transfers: Sit to/from Stand Sit to Stand: Min guard         General transfer comment: observed ambulating in hall with PT prior to entry    Balance                                   ADL                                         General ADL Comments: Pt verbalized wiping back to front and reports feeling as if she could be getting a yeast infection. OT starting discussion of AE for peri care. pt verbalized understanding of this AE and even discussed how to apply wipe from previous OT. Pt does not plan to use this but instead insist on current method. Pt does however plan to use wet wipes. Pt sitting in chair eating lunch. Pt unable to cross bil LE  (pt with no further questions at this time)      Vision                     Perception     Praxis      Cognition    Behavior During Therapy: [facility] for tasks assessed/performed Overall Cognitive Status: Within Functional Limits for tasks assessed                       Extremity/Trunk Assessment               Exercises     Shoulder Instructions       General Comments      Pertinent Vitals/ Pain       Pain Assessment: 0-10 Pain Score: 6  Pain Location: back Pain Descriptors / Indicators: Discomfort;Pressure;Sore Pain Intervention(s): Monitored during session (pt states I am so much better today)  Home Living                                          Prior Functioning/Environment              Frequency Min 2X/week     Progress  Toward Goals  OT Goals(current goals can now be found in the care plan section)  Progress towards OT goals: Progressing toward goals  Acute Rehab OT Goals Patient Stated Goal: not stated OT Goal Formulation: With patient Time For Goal Achievement: 07/06/14 Potential to Achieve Goals: Good ADL Goals Pt Will Perform Grooming: with modified independence;standing Pt Will Transfer to Toilet: with modified independence;ambulating Pt Will Perform Toileting - Clothing Manipulation and hygiene: with modified independence;with adaptive equipment;sit to/from stand Additional ADL Goal #1: Pt will independently verbalize and maintain 3/3 back precautions.  Plan Discharge plan remains appropriate    Co-evaluation                 End of Session Equipment Utilized During Treatment: Gait belt;Rolling walker;Back brace   Activity Tolerance No increased pain   Patient Left with call bell/phone within reach;in chair   Nurse Communication Mobility status;Precautions        Time: 7615-1834 OT Time Calculation (min): 8 min  Charges: OT General Charges $OT Visit: 1 Procedure OT Treatments $Self Care/Home Management : 8-22 mins  Parke Poisson B 07/01/2014, 2:07 PM  Pager: [phone]

## 2014-07-01 NOTE — Progress Notes (Signed)
Physical Therapy Treatment Patient Details Name: Caitlin Ho MRN: [id number] DOB: [birth year] Today's Date: 07/01/2014    History of Present Illness Patient is a 59 yo female with pmh that includes HTN, DM, hypothyroid, GERD presents s/p lumbar one -two Posterior lumbar interbody fusion.  Pt with previous back surgeries.    PT Comments    Patient able to tolerate ambulation despite increased pain this session. Patient still with significant RLE pain, OF NOTE: increased swelling in BLEs and feet. Will continue to progress activity as tolerated given current pain control issues.   Follow Up Recommendations  No PT follow up;Supervision/Assistance - 24 hour     Equipment Recommendations  None recommended by PT    Recommendations for Other Services       Precautions / Restrictions Precautions Precautions: Back;Fall Precaution Booklet Issued: No Precaution Comments: educated on back precautions Required Braces or Orthoses: Spinal Brace Spinal Brace: Lumbar corset;Applied in sitting position Restrictions Weight Bearing Restrictions: No    Mobility  Bed Mobility                  Transfers Overall transfer level: Needs assistance Equipment used: Rolling walker (2 wheeled) Transfers: Sit to/from Stand Sit to Stand: Min guard         General transfer comment: VCs for hand placement and safety (use of RUE to support during descent to chair  Ambulation/Gait Ambulation/Gait assistance: Supervision Ambulation Distance (Feet): 180 Feet (addtl 80 ft in cushioned slippers) Assistive device: Rolling walker (2 wheeled) Gait Pattern/deviations: Step-through pattern;Decreased stride length;Antalgic Gait velocity: decreased and guarded Gait velocity interpretation: Below normal speed for age/gender General Gait Details: continues to report pain in RLE   Stairs            Wheelchair Mobility    Modified Rankin (Stroke Patients Only)       Balance                                     Cognition Arousal/Alertness: Awake/alert Behavior During Therapy: WFL for tasks assessed/performed Overall Cognitive Status: Within Functional Limits for tasks assessed                      Exercises      General Comments General comments (skin integrity, edema, etc.): increased edema noted in bilateral LEs, > on RLE. Attempts second trial of ambulation with cushioned slippers but continues to have same pain in RLE       Pertinent Vitals/Pain Pain Assessment: 0-10 Pain Score: 6  Pain Location: back Pain Descriptors / Indicators: Discomfort;Pressure;Sore Pain Intervention(s): Monitored during session;Repositioned    Home Living                      Prior Function            PT Goals (current goals can now be found in the care plan section) Acute Rehab PT Goals Patient Stated Goal: not stated PT Goal Formulation: With patient Time For Goal Achievement: 07/13/14 Potential to Achieve Goals: Good Progress towards PT goals: Progressing toward goals    Frequency  Min 5X/week    PT Plan Current plan remains appropriate    Co-evaluation             End of Session Equipment Utilized During Treatment: Gait belt;Back brace Activity Tolerance: Patient tolerated treatment well;Patient limited by fatigue;Patient limited by pain Patient  left: in chair;with call bell/phone within reach     Time: 1126-1150 PT Time Calculation (min) (ACUTE ONLY): 24 min  Charges:  $Gait Training: 8-22 mins $Therapeutic Activity: 8-22 mins                    G CodesDuncan Dull 28-Jul-2014, 1:09 PM Alben Deeds, [place] DPT  [phone]

## 2014-07-02 DIAGNOSIS — I959 Hypotension, unspecified: Secondary | ICD-10-CM

## 2014-07-02 DIAGNOSIS — M7989 Other specified soft tissue disorders: Secondary | ICD-10-CM

## 2014-07-02 LAB — HEMOGLOBIN A1C
Hgb A1c MFr Bld: 6.2 % — ABNORMAL HIGH (ref 4.8–5.6)
MEAN PLASMA GLUCOSE: 131 mg/dL

## 2014-07-02 LAB — T4, FREE: Free T4: 1.86 ng/dL — ABNORMAL HIGH (ref 0.80–1.80)

## 2014-07-02 MED ORDER — OXYCODONE-ACETAMINOPHEN 5-325 MG PO TABS: 1.0000 | ORAL_TABLET | ORAL | Status: DC | PRN

## 2014-07-02 NOTE — Progress Notes (Signed)
Patient is d/c to home. D/c instruction given and patient verbalized understanding.

## 2014-07-02 NOTE — Discharge Summary (Signed)
Physician Discharge Summary  Patient ID: Caitlin Ho MRN: [id number] DOB/AGE: 10/03/1955 59 y.o.  Admit date: 06/27/2014 Discharge date: 07/02/2014  Admission Diagnoses:  Discharge Diagnoses:  Active Problems:   Lumbar stenosis   Discharged Condition: good  Hospital Course: Patient admitted to the hospital where she underwent an uncomplicated lumbar decompression and fusion. Postoperative the patient had some increasing difficulty with right lower extremity swelling and pain. Follow-up studies revealed no structural lesions. Symptoms have improved with time. Patient now ambulating with minimal difficulty. No wound issues. Patient feels ready to go home.  Consults:   Significant Diagnostic Studies:   Treatments:   Discharge Exam: Blood pressure 121/83, pulse 88, temperature 98.1 F (36.7 C), temperature source Oral, resp. rate 20, height 5\' 6"  (1.676 m), weight 97.523 kg (215 lb), SpO2 97 %. Awake and alert. Oriented and appropriate. Motor and sensory function stable. Wound clean and dry. Chest and abdomen benign.  Disposition: 01-Home or Self Care     Medication List    TAKE these medications        cloNIDine 0.1 MG tablet  Commonly known as:  CATAPRES  Take 0.2 mg by mouth every evening.     doxycycline 50 MG capsule  Commonly known as:  VIBRAMYCIN  Take 2 capsules (100 mg total) by mouth 2 (two) times daily.     gabapentin 300 MG capsule  Commonly known as:  NEURONTIN  Take 1 capsule (300 mg total) by mouth 3 (three) times daily.     ibuprofen 200 MG tablet  Commonly known as:  ADVIL,MOTRIN  Take 200 mg by mouth every 6 (six) hours as needed for moderate pain.     levothyroxine 175 MCG tablet  Commonly known as:  SYNTHROID, LEVOTHROID  Take 175 mcg by mouth daily before breakfast.     lisinopril 5 MG tablet  Commonly known as:  PRINIVIL,ZESTRIL  Take 5 mg by mouth every morning.     meloxicam 15 MG tablet  Commonly known as:  MOBIC  Take 15 mg by mouth  daily.     methocarbamol 500 MG tablet  Commonly known as:  ROBAXIN  Take 1 tablet (500 mg total) by mouth every 6 (six) hours as needed for muscle spasms.     methocarbamol 500 MG tablet  Commonly known as:  ROBAXIN  Take 1 tablet (500 mg total) by mouth every 6 (six) hours as needed for muscle spasms.     oxyCODONE-acetaminophen 5-325 MG per tablet  Commonly known as:  PERCOCET/ROXICET  Take 1-2 tablets by mouth every 4 (four) hours as needed for moderate pain.     polyethylene glycol packet  Commonly known as:  MIRALAX / GLYCOLAX  Take 17 g by mouth daily as needed.     predniSONE 20 MG tablet  Commonly known as:  DELTASONE  - Take 20 mg by mouth daily with breakfast. $0mg  for 4 days  -   - 30 mg for 3 days- started 06/20/14,   - Thursday 06/23/14 will begin 20 mg and remain on 20 mg until surgery.     scopolamine 1 MG/3DAYS  Commonly known as:  TRANSDERM-SCOP  Place 1 patch onto the skin every 3 (three) days.           Follow-up Information    Follow up with Earleen Newport, MD.   Specialty:  Neurosurgery   Contact information:   [address] [place] [address] [phone]       Signed:  Mileigh Tilley A 07/02/2014, 9:48 AM

## 2014-07-02 NOTE — Progress Notes (Signed)
Physical Therapy Treatment Patient Details Name: Caitlin Ho MRN: [id number] DOB: January 24, 1956 Today's Date: 07/02/2014    History of Present Illness Patient is a 59 yo female with pmh that includes HTN, DM, hypothyroid, GERD presents s/p lumbar one -two Posterior lumbar interbody fusion.  Pt with previous back surgeries.    PT Comments    Safely completed stair training. Verbalizes 3/3 back precautions. Back brace adjusted appropriately. Ambulating 160 feet without physical assistance, using a rolling walker for light support. States her Rt hip pain is greatly improving. Adequate for d/c from PT standpoint.  Follow Up Recommendations  No PT follow up;Supervision/Assistance - 24 hour     Equipment Recommendations  None recommended by PT    Recommendations for Other Services       Precautions / Restrictions Precautions Precautions: Back;Fall Precaution Booklet Issued: No Precaution Comments: educated on back precautions Required Braces or Orthoses: Spinal Brace Spinal Brace: Lumbar corset;Applied in sitting position Restrictions Weight Bearing Restrictions: No    Mobility  Bed Mobility Overal bed mobility: Modified Independent             General bed mobility comments: extra time  Transfers Overall transfer level: Needs assistance Equipment used: Rolling walker (2 wheeled) Transfers: Sit to/from Stand Sit to Stand: Supervision         General transfer comment: Supervision for safety. No physical assist needed. Cues for hand placement prior to sitting  Ambulation/Gait Ambulation/Gait assistance: Supervision Ambulation Distance (Feet): 160 Feet Assistive device: Rolling walker (2 wheeled) Gait Pattern/deviations: Step-through pattern;Decreased stride length Gait velocity: decreased and guarded   General Gait Details: States RLE pain improving, especially compared to yesterday. No buckling of LEs noted, demonstrates good control of RW. Supervision for safety  only.   Stairs Stairs: Yes Stairs assistance: Supervision Stair Management: Step to pattern;Forwards;Two rails Number of Stairs: 2 General stair comments: VC for sequencing with instructions on safe stair navigation technique. Pt performed without physical assist and demonstrates maintaining back precautions throughout task.  Wheelchair Mobility    Modified Rankin (Stroke Patients Only)       Balance                                    Cognition Arousal/Alertness: Awake/alert Behavior During Therapy: WFL for tasks assessed/performed Overall Cognitive Status: Within Functional Limits for tasks assessed                      Exercises      General Comments General comments (skin integrity, edema, etc.): Verbalizes 3/3 back precautions. No edema of RLE noted today.      Pertinent Vitals/Pain Pain Assessment: 0-10 Pain Score:  ("better today but still there" no value given) Pain Location: Rt hip and back Pain Intervention(s): Monitored during session;Repositioned    Home Living                      Prior Function            PT Goals (current goals can now be found in the care plan section) Acute Rehab PT Goals Patient Stated Goal: not stated PT Goal Formulation: With patient Time For Goal Achievement: 07/13/14 Potential to Achieve Goals: Good Progress towards PT goals: Progressing toward goals    Frequency  Min 5X/week    PT Plan Current plan remains appropriate    Co-evaluation  End of Session Equipment Utilized During Treatment: Gait belt;Back brace Activity Tolerance: Patient tolerated treatment well Patient left: in chair;with call bell/phone within reach;with family/visitor present     Time: 1459-1510 PT Time Calculation (min) (ACUTE ONLY): 11 min  Charges:  $Gait Training: 8-22 mins                    G Codes:      Ellouise Newer Jul 21, 2014, 3:48 PM Camille [place], [place]

## 2014-07-02 NOTE — Progress Notes (Addendum)
[facility]                                                                              Patient Demographics  Caitlin Ho, is a 59 y.o. female, DOB - 1955-11-03, DHR:[id number]  Admit date - 06/27/2014   Admitting Physician Kristeen Miss, MD  Outpatient Primary MD for the patient is Enid Skeens., MD  LOS - 5   No chief complaint on file.      Brief HPI   Patient is a 59 year old female with past mental history of hypothyroidism who was at a number of back pain issues and has been through a number of lumbar fusions. Lately patient had developed acute severe centralized back pain with radiation down both legs and an MRI demonstrate presence of a large centrally herniated disc at L1-L2 and patient was admitted on 4/25 for decompression and fusion. Postop, patient has been doing relatively well. It was noted that she's been having episodes of hypotension, treated due to her medications. When working with physical therapy loss 24 hours, it was noted that her right lower extremity was quite swollen. Neurosurgery requested hospitalist consultation for workup of this. In addition, when I saw the patient, her complaint was less of swelling him more of right hip pain, most notably when she gets out of bed and tries to walk with physical therapy. She states that this is new and was not an issue before.   Assessment & Plan   Active Problems:  Lumbar stenosis: Status post lumbar fusion. - Per neurosurgery  " swelling"sensation: - BNP is normal, possibly due to steroids and once stable off, will hopefully resolve. The patient is feeling much better today.   Hypotension: Improved this morning  Right hip pain: - Right hip x-ray with no fracture or dislocation or arthropathy.  Right lower extremity swelling:  - Per patient improving today, Doppler lower extremities still pending, Homans sign is negative - Addendum: Doppler LE negative for DVT  Low TSH: Patient on  Synthroid. - TSH is 0.047, free T4 slightly elevated at 1.86, once tapered off of prednisone and recovered, recheck in 4 weeks. If TSH is still low, may need to reduce Synthroid dose. This can be done outpatient by her PCP.   Leukocytosis: Likely due to steroids, no signs of infection, afebrile.    Code Status: Full code  Family Communication: Discussed in detail with the patient, all imaging results, lab results explained to the patient    Disposition Plan: Home by primary service. She is okay to be discharged home from my standpoint if Dopplers right lower extremity are negative for any DVT.  Dopplers LE neg, pat can dc per primary service.I will sign off.   Time Spent in minutes  25 minutes  Procedures  Lumbar fusion surgery  DVT Prophylaxis SCD's  Medications  Scheduled Meds: . cloNIDine  0.2 mg Oral QPM  . dexamethasone  2 mg Oral 3 times per day  . docusate sodium  100 mg Oral BID  . gabapentin  300 mg Oral TID  . levothyroxine  175 mcg Oral QAC breakfast  . lisinopril  5 mg Oral q  morning - 10a  . scopolamine  1 patch Transdermal Q72H  . senna  1 tablet Oral BID  . sodium chloride  3 mL Intravenous Q12H   Continuous Infusions: . sodium chloride 250 mL (06/27/14 1215)   PRN Meds:.acetaminophen **OR** acetaminophen, alum & mag hydroxide-simeth, bisacodyl, menthol-cetylpyridinium **OR** phenol, methocarbamol **OR** methocarbamol (ROBAXIN)  IV, morphine injection, ondansetron (ZOFRAN) IV, oxyCODONE-acetaminophen, polyethylene glycol, sodium chloride   Antibiotics   Anti-infectives    Start     Dose/Rate Route Frequency Ordered Stop   06/27/14 1600  ceFAZolin (ANCEF) IVPB 1 g/50 mL premix     1 g 100 mL/hr over 30 Minutes Intravenous Every 8 hours 06/27/14 1212 06/28/14 0032   06/27/14 0720  bacitracin 50,000 Units in sodium chloride irrigation 0.9 % 500 mL irrigation  Status:  Discontinued       As needed 06/27/14 0845 06/27/14 1036   06/27/14 0600  ceFAZolin  (ANCEF) IVPB 2 g/50 mL premix     2 g 100 mL/hr over 30 Minutes Intravenous On call to O.R. 06/26/14 1444 06/27/14 0742        Subjective:   Rexann Lueras was seen and examined today.  Patient denies dizziness, chest pain, shortness of breath, abdominal pain, N/V/D/C, new weakness, numbess, tingling. No acute events overnight.  Feels a lot better today, hoping to go home today  Objective:   Blood pressure 121/83, pulse 88, temperature 98.1 F (36.7 C), temperature source Oral, resp. rate 20, height 5\' 6"  (1.676 m), weight 97.523 kg (215 lb), SpO2 97 %.  Wt Readings from Last 3 Encounters:  06/28/14 97.523 kg (215 lb)     Intake/Output Summary (Last 24 hours) at 07/02/14 1131 Last data filed at 07/02/14 0620  Gross per 24 hour  Intake    360 ml  Output      0 ml  Net    360 ml    Exam  General: Alert and oriented x 3, NAD  HEENT:  PERRLA, EOMI, Anicteic Sclera, mucous membranes moist.   Neck: Supple, no JVD, no masses  CVS: S1 S2 auscultated, no rubs, murmurs or gallops. Regular rate and rhythm.  Respiratory: Clear to auscultation bilaterally, no wheezing, rales or rhonchi  Abdomen: Obese, Soft, nontender, nondistended, + bowel sounds  Ext: no cyanosis clubbing or edema  Neuro: AAOx3, Cr N's II- XII. Strength 5/5 upper and lower extremities bilaterally  Skin: No rashes  Psych: Normal affect and demeanor, alert and oriented x3    Data Review   Micro Results No results found for this or any previous visit (from the past 240 hour(s)).  Radiology Reports Ct Abdomen Wo Contrast  06/09/2014   CLINICAL DATA:  Right adrenal lesion identified on MRI lumbar spine 05/19/2014. Follow-up examination.  EXAM: CT ABDOMEN WITHOUT CONTRAST  TECHNIQUE: Multidetector CT imaging of the abdomen was performed following the standard protocol without IV contrast.  COMPARISON:  MRI lumbar spine 05/19/2014.  FINDINGS: The lung bases are clear. There is no pleural or pericardial effusion.   A left adrenal lesion measuring 2.4 x 2.5 cm in diameter has Hounsfield unit measurements of -6 and is consistent with a benign adenoma. The right adrenal gland appears normal.  The gallbladder, liver, spleen and pancreas appear normal. Visualized small and large bowel loops appear normal. No lymphadenopathy or fluid collection is identified.  The patient is status post L2-5 fusion. Disc protrusion at L1-2 is identified but better demonstrated on prior MRI.  IMPRESSION: Left adrenal lesion seen on lumbar  spine MRI is a benign adenoma. No further imaging is recommended. No acute abnormality.  Disc protrusion L1-2.  The patient is status post L2-5 fusion   Electronically Signed   By: Inge Rise M.D.   On: 06/09/2014 15:30   Dg Lumbar Spine 2-3 Views  06/27/2014   CLINICAL DATA:  L1-2 spinal stenosis. Posterior lumbar interbody fusion at L1-2.  EXAM: DG C-ARM 61-120 MIN; LUMBAR SPINE - 2-3 VIEW  COMPARISON:  07/19/2013  FINDINGS: Pre-existing interbody cages and posterior spinal fixation rods are again seen from levels of L2-L5.  New interbody cages and posterior spinal fixation rods are now seen at level of L1-2. Lumbar alignment appears normal at this level.  IMPRESSION: New posterior lumbar interbody fusion at L1-2, with normal alignment.   Electronically Signed   By: Earle Gell M.D.   On: 06/27/2014 10:39   Ct Lumbar Spine Wo Contrast  06/30/2014   CLINICAL DATA:  59 year old female status post third spinal fusion. Increasing lumbar back pain. Initial encounter.  EXAM: CT LUMBAR SPINE WITHOUT CONTRAST  TECHNIQUE: Multidetector CT imaging of the lumbar spine was performed without intravenous contrast administration. Multiplanar CT image reconstructions were also generated.  COMPARISON:  CT Abdomen 06/09/2014.  Lumbar MRI 05/19/2014.  FINDINGS: Same numbering system as on 05/19/2014 designating normal lumbar segmentation.  Stable visualized abdominal viscera, including left adrenal adenoma. No free  fluid or retroperitoneal fluid or stranding identified.  Postoperative changes to the posterior paraspinal soft tissues. Trace subcutaneous gas at the T11-T12 level. Small volume postoperative fluid suspected overlying the lumbar spinous processes.  Visible sacrum and SI joints are intact. Stable vertebral height and alignment with straightening of lordosis.  T11-T12:  Mild disc bulge.  No significant stenosis.  T12-L1:  Stable mild facet hypertrophy.  No stenosis.  L1-L2: New L1 transpedicular screws and interbody implant at this level. Hardware intact. Sequelae of L1 laminectomy. Underlying chronic endplate osteophytes. L2 transpedicular hardware appears stable.  L2-L3: Stable CT appearance status post decompression and fusion with posterior and interbody hardware. Solid interbody arthrodesis.  L3-L4: Stable CT appearance status post decompression and fusion with posterior and interbody hardware. Solid interbody arthrodesis.  L4-L5: Stable CT appearance status post decompression and fusion with posterior and interbody hardware. Solid interbody arthrodesis.  L5-S1: Stable mild to moderate facet hypertrophy. Stable disc bulge.  IMPRESSION: 1. Cephalad extension of posterior and interbody fusion to L1-L2 with laminectomy. No adverse hardware features. Expected postoperative soft tissue changes by CT. 2. Stable postoperative appearance of the lumbar spine elsewhere, with solid interbody arthrodesis from the L2 to the L5 level.   Electronically Signed   By: Genevie Ann M.D.   On: 06/30/2014 12:33   Dg C-arm 1-60 Min  06/27/2014   CLINICAL DATA:  L1-2 spinal stenosis. Posterior lumbar interbody fusion at L1-2.  EXAM: DG C-ARM 61-120 MIN; LUMBAR SPINE - 2-3 VIEW  COMPARISON:  07/19/2013  FINDINGS: Pre-existing interbody cages and posterior spinal fixation rods are again seen from levels of L2-L5.  New interbody cages and posterior spinal fixation rods are now seen at level of L1-2. Lumbar alignment appears normal at this  level.  IMPRESSION: New posterior lumbar interbody fusion at L1-2, with normal alignment.   Electronically Signed   By: Earle Gell M.D.   On: 06/27/2014 10:39   Dg Hip Port Unilat With Pelvis 1v Right  07/01/2014   CLINICAL DATA:  Acute onset right hip pain.  No known injury.  EXAM: RIGHT HIP (WITH PELVIS) 1  VIEW PORTABLE  COMPARISON:  None.  FINDINGS: There is no evidence of hip fracture or dislocation. There is no evidence of arthropathy or other focal bone abnormality. Lower lumbar spine fusion hardware noted.  IMPRESSION: Negative.   Electronically Signed   By: Earle Gell M.D.   On: 07/01/2014 19:54    CBC  Recent Labs Lab 06/28/14 1445 07/01/14 1607  WBC 11.8* 17.2*  HGB 10.7* 11.3*  HCT 33.6* 34.9*  PLT 263 369  MCV 91.6 90.4  MCH 29.2 29.3  MCHC 31.8 32.4  RDW 13.3 13.3    Chemistries   Recent Labs Lab 06/28/14 1445 07/01/14 1607  NA 140 141  K 3.9 4.2  CL 107 104  CO2 27 29  GLUCOSE 100* 116*  BUN 15 19  CREATININE 0.81 0.82  CALCIUM 7.5* 8.6  AST  --  15  ALT  --  15  ALKPHOS  --  67  BILITOT  --  0.3   ------------------------------------------------------------------------------------------------------------------ estimated creatinine clearance is 90.2 mL/min (by C-G formula based on Cr of 0.82). ------------------------------------------------------------------------------------------------------------------  Recent Labs  07/01/14 1607  HGBA1C 6.2*   ------------------------------------------------------------------------------------------------------------------ No results for input(s): CHOL, HDL, LDLCALC, TRIG, CHOLHDL, LDLDIRECT in the last 72 hours. ------------------------------------------------------------------------------------------------------------------  Recent Labs  07/01/14 1607  TSH 0.047*   ------------------------------------------------------------------------------------------------------------------ No results for  input(s): VITAMINB12, FOLATE, FERRITIN, TIBC, IRON, RETICCTPCT in the last 72 hours.  Coagulation profile No results for input(s): INR, PROTIME in the last 168 hours.  No results for input(s): DDIMER in the last 72 hours.  Cardiac Enzymes No results for input(s): CKMB, TROPONINI, MYOGLOBIN in the last 168 hours.  Invalid input(s): CK ------------------------------------------------------------------------------------------------------------------ Invalid input(s): POCBNP  No results for input(s): GLUCAP in the last 72 hours.   RAI,RIPUDEEP M.D. [facility] 07/02/2014, 11:31 AM  Pager: [phone]   Between 7am to 7pm - call Pager - [phone]  After 7pm go to [email or web address] - password [email or web address]  Call night coverage person covering after 7pm

## 2014-07-02 NOTE — Progress Notes (Signed)
VASCULAR LAB PRELIMINARY  PRELIMINARY  PRELIMINARY  PRELIMINARY  Right lower extremity venous duplex  completed.    Preliminary report:  Right:  No evidence of DVT, superficial thrombosis, or Baker's cyst.   Kolbie Clarkston, RVT 07/02/2014, 2:55 PM

## 2014-07-06 ENCOUNTER — Encounter (HOSPITAL_COMMUNITY): Payer: Self-pay | Admitting: Neurological Surgery

## 2014-11-04 IMAGING — RF DG LUMBAR SPINE 2-3V
1 series · 3 of 3 positions shown · non-contrast
Comparison: CT 01/18/2013

CLINICAL DATA: Posterior fusion

EXAM:
LUMBAR SPINE - 2-3 VIEW; DG C-ARM 1-60 MIN

[Series 1: run · 3 of 3 slices shown]
[im 1/3]
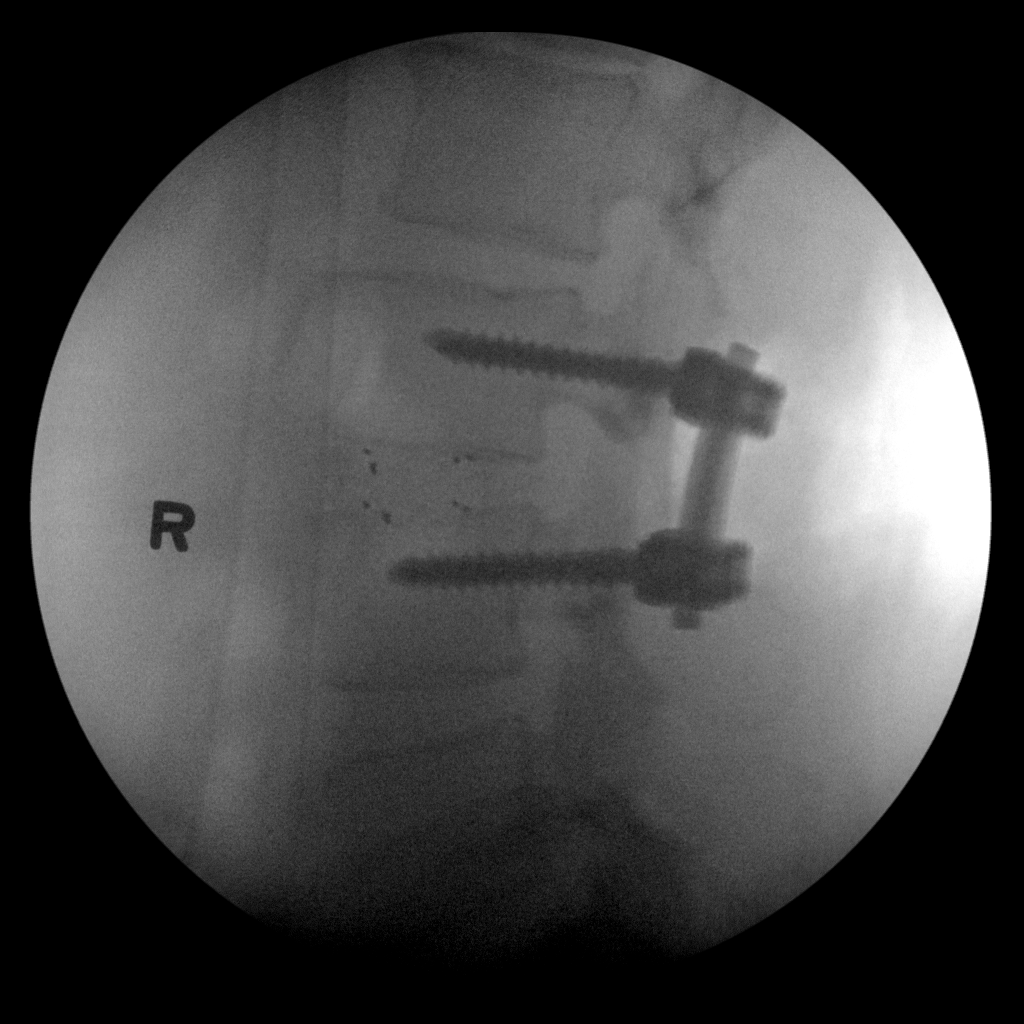
[im 2/3]
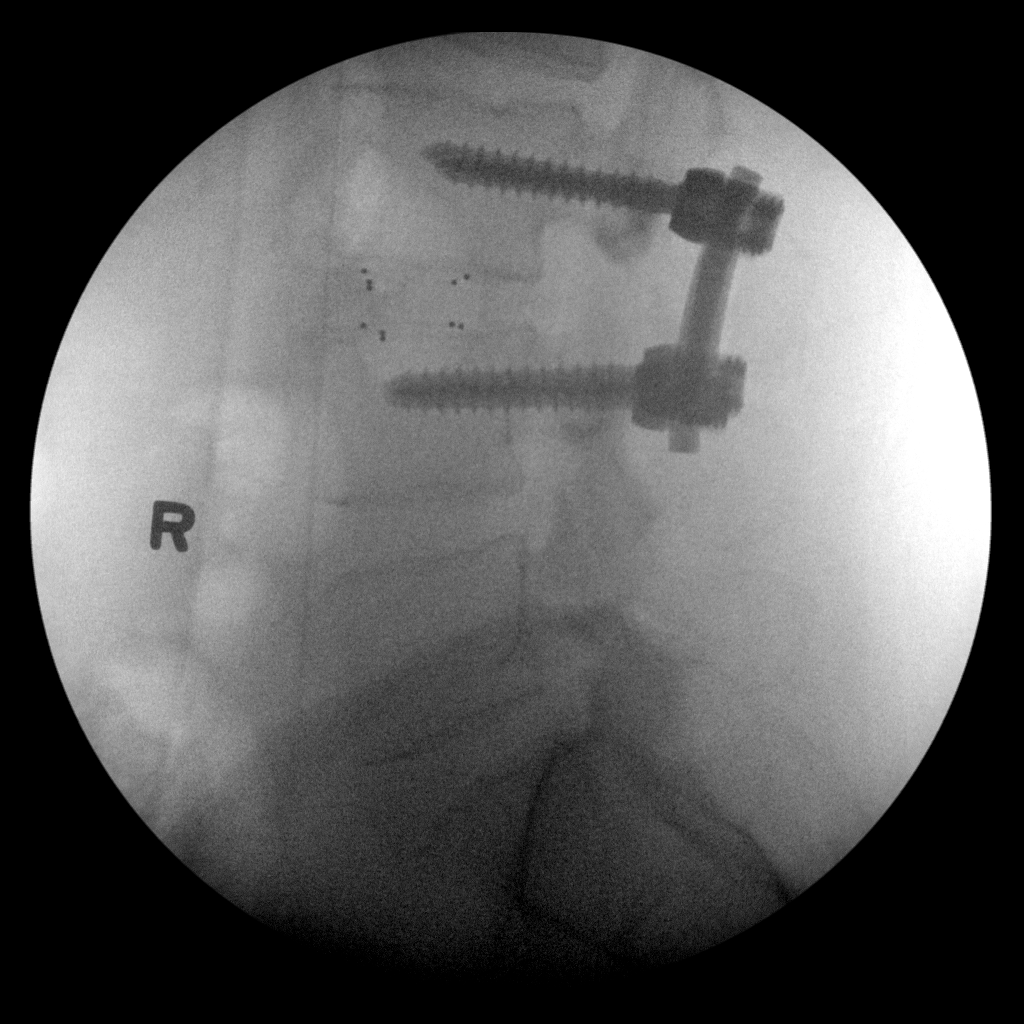
[im 3/3]
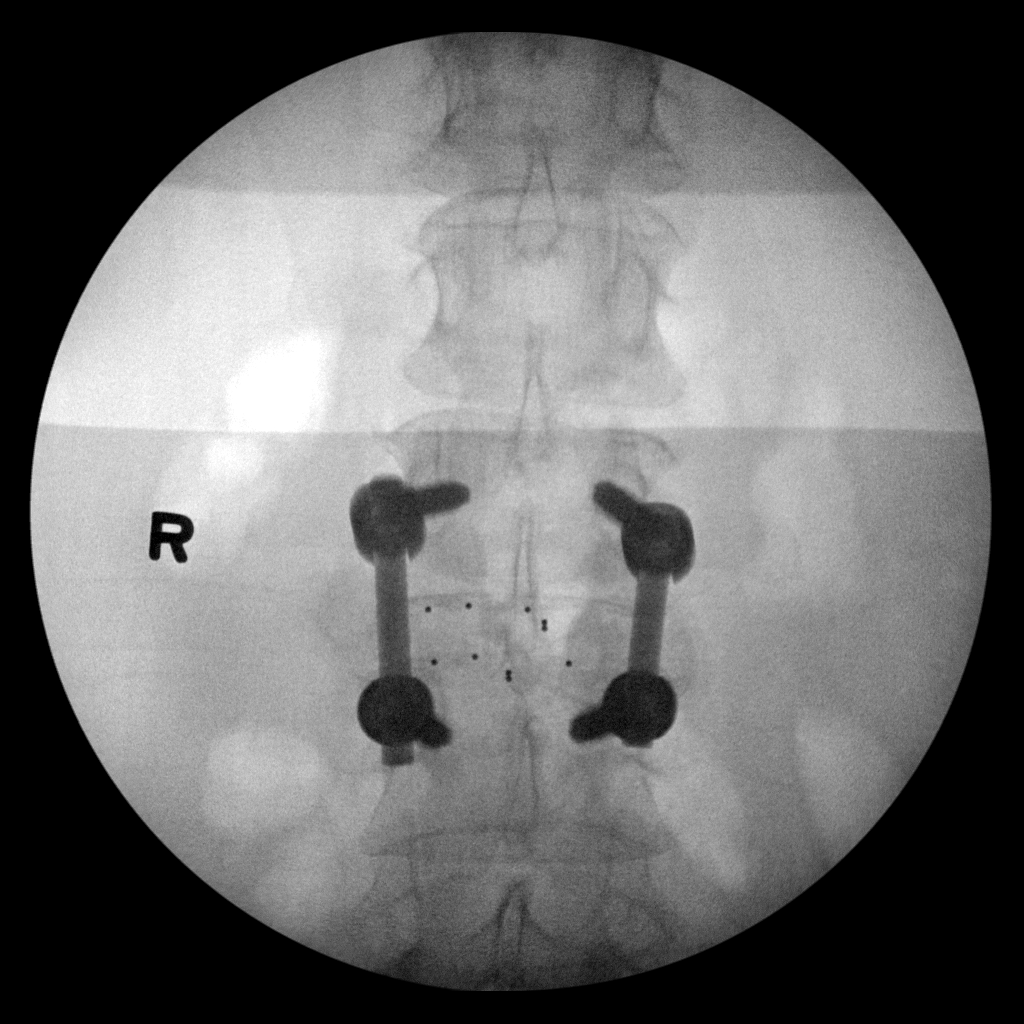

[3 of 3 positions shown; findings below may reference images not displayed]

FINDINGS: Intraoperative spot imaging demonstrates changes of posterior fusion
at L3-4. Normal alignment. No hardware or bony complicating feature
visualized.
IMPRESSION: L3-4 posterior fusion.

## 2015-03-05 HISTORY — PX: CERVICAL FUSION: SHX112

## 2015-04-06 IMAGING — RF DG LUMBAR SPINE 2-3V
1 series · 2 of 2 positions shown · non-contrast
Comparison: 02/16/2013 and lumbar spine MRI of 06/17/2013

CLINICAL DATA: L2-3 and L4-5 fusion.

EXAM:
DG C-ARM 61-120 MIN; LUMBAR SPINE - 2-3 VIEW

[Series 1: run · 2 of 2 slices shown]
[im 1/2]
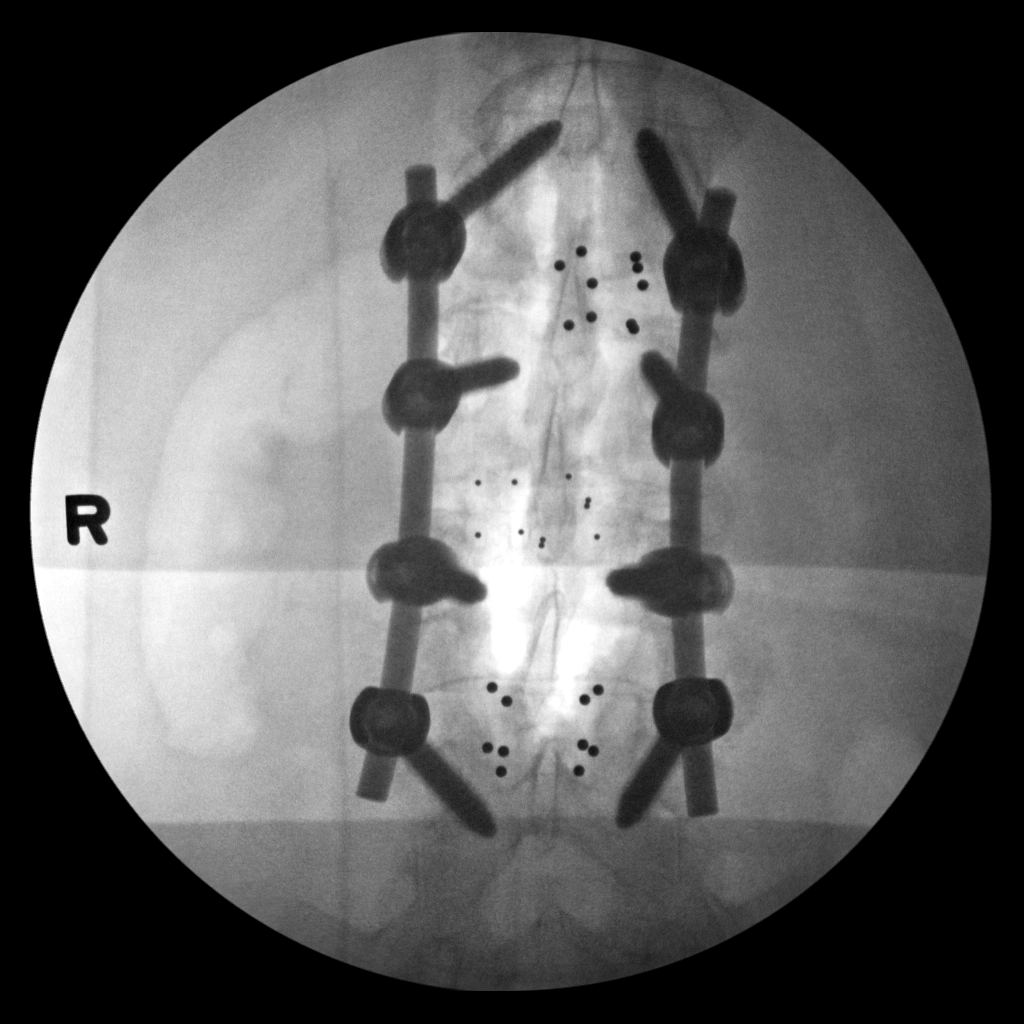
[im 2/2]
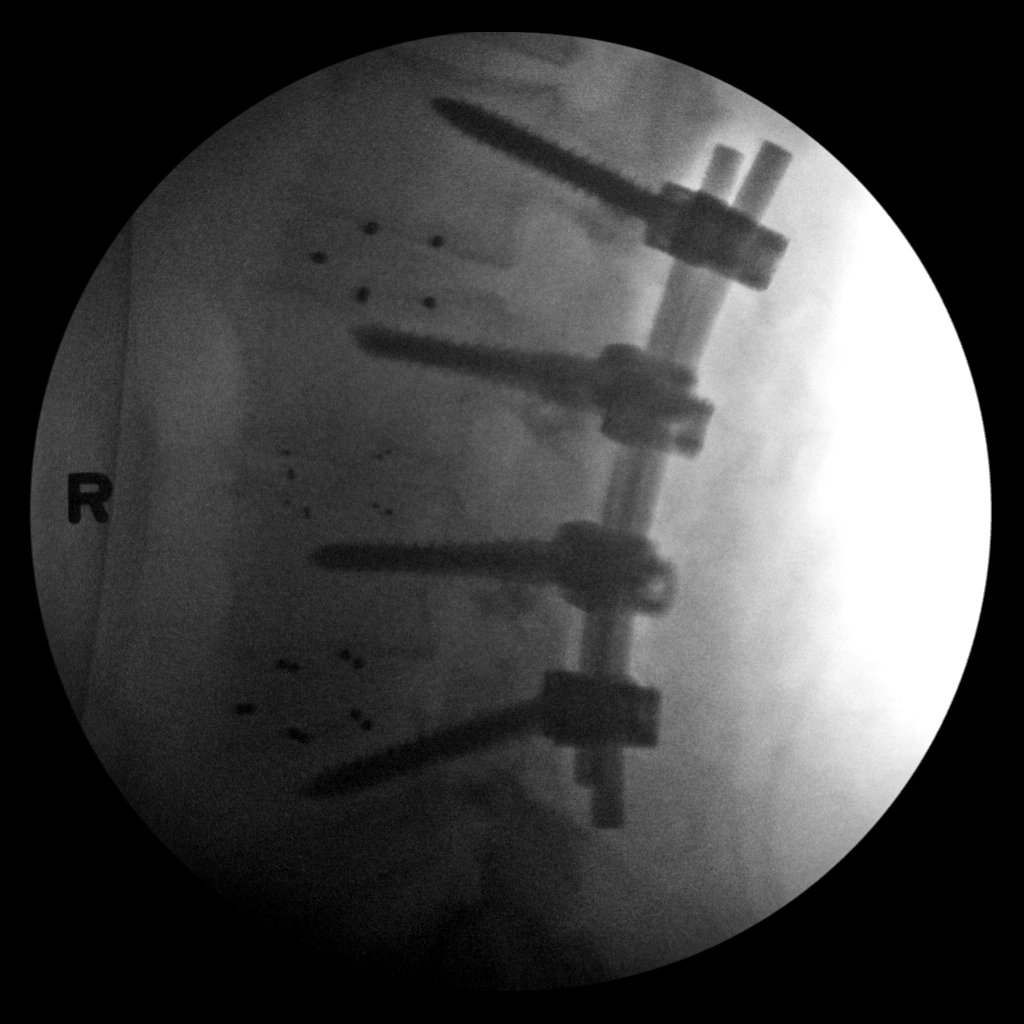

[2 of 2 positions shown; findings below may reference images not displayed]

FINDINGS: AP and lateral views. Posterior trans pedicle screw fixation at 4
levels. Likely L2 through L5. Lumbosacral junction is not well
visualized. No acute hardware complication.
IMPRESSION: Intraoperative imaging of lumbar spine fixation.

## 2015-04-13 DIAGNOSIS — G8929 Other chronic pain: Secondary | ICD-10-CM | POA: Diagnosis not present

## 2015-04-13 DIAGNOSIS — M5416 Radiculopathy, lumbar region: Secondary | ICD-10-CM | POA: Diagnosis not present

## 2015-04-13 DIAGNOSIS — Z6835 Body mass index (BMI) 35.0-35.9, adult: Secondary | ICD-10-CM | POA: Diagnosis not present

## 2015-08-15 DIAGNOSIS — E559 Vitamin D deficiency, unspecified: Secondary | ICD-10-CM | POA: Diagnosis not present

## 2015-08-15 DIAGNOSIS — E119 Type 2 diabetes mellitus without complications: Secondary | ICD-10-CM | POA: Diagnosis not present

## 2015-08-15 DIAGNOSIS — I10 Essential (primary) hypertension: Secondary | ICD-10-CM | POA: Diagnosis not present

## 2015-08-15 DIAGNOSIS — E784 Other hyperlipidemia: Secondary | ICD-10-CM | POA: Diagnosis not present

## 2015-08-15 DIAGNOSIS — E038 Other specified hypothyroidism: Secondary | ICD-10-CM | POA: Diagnosis not present

## 2015-08-15 DIAGNOSIS — R5383 Other fatigue: Secondary | ICD-10-CM | POA: Diagnosis not present

## 2015-08-15 DIAGNOSIS — Z79899 Other long term (current) drug therapy: Secondary | ICD-10-CM | POA: Diagnosis not present

## 2015-09-04 DIAGNOSIS — Z1231 Encounter for screening mammogram for malignant neoplasm of breast: Secondary | ICD-10-CM | POA: Diagnosis not present

## 2015-09-15 DIAGNOSIS — J069 Acute upper respiratory infection, unspecified: Secondary | ICD-10-CM | POA: Diagnosis not present

## 2015-09-15 DIAGNOSIS — R112 Nausea with vomiting, unspecified: Secondary | ICD-10-CM | POA: Diagnosis not present

## 2015-10-12 DIAGNOSIS — M5412 Radiculopathy, cervical region: Secondary | ICD-10-CM | POA: Diagnosis not present

## 2015-10-20 DIAGNOSIS — M5412 Radiculopathy, cervical region: Secondary | ICD-10-CM | POA: Diagnosis not present

## 2015-10-20 DIAGNOSIS — M50222 Other cervical disc displacement at C5-C6 level: Secondary | ICD-10-CM | POA: Diagnosis not present

## 2015-11-01 DIAGNOSIS — M5412 Radiculopathy, cervical region: Secondary | ICD-10-CM | POA: Diagnosis not present

## 2015-11-01 DIAGNOSIS — Z6835 Body mass index (BMI) 35.0-35.9, adult: Secondary | ICD-10-CM | POA: Diagnosis not present

## 2015-11-15 DIAGNOSIS — J209 Acute bronchitis, unspecified: Secondary | ICD-10-CM | POA: Diagnosis not present

## 2015-12-01 DIAGNOSIS — J209 Acute bronchitis, unspecified: Secondary | ICD-10-CM | POA: Diagnosis not present

## 2015-12-22 DIAGNOSIS — M50122 Cervical disc disorder at C5-C6 level with radiculopathy: Secondary | ICD-10-CM | POA: Diagnosis not present

## 2015-12-22 DIAGNOSIS — M47892 Other spondylosis, cervical region: Secondary | ICD-10-CM | POA: Diagnosis not present

## 2015-12-22 DIAGNOSIS — M4722 Other spondylosis with radiculopathy, cervical region: Secondary | ICD-10-CM | POA: Diagnosis not present

## 2016-01-11 DIAGNOSIS — M5412 Radiculopathy, cervical region: Secondary | ICD-10-CM | POA: Diagnosis not present

## 2016-01-11 DIAGNOSIS — Z6834 Body mass index (BMI) 34.0-34.9, adult: Secondary | ICD-10-CM | POA: Diagnosis not present

## 2016-01-11 DIAGNOSIS — I10 Essential (primary) hypertension: Secondary | ICD-10-CM | POA: Diagnosis not present

## 2016-03-14 IMAGING — RF DG LUMBAR SPINE 2-3V
1 series · 2 of 2 positions shown · non-contrast
Comparison: 07/19/2013

CLINICAL DATA: L1-2 spinal stenosis. Posterior lumbar interbody
fusion at L1-2.

EXAM:
DG C-ARM 61-120 MIN; LUMBAR SPINE - 2-3 VIEW

[Series 1: run · 2 of 2 slices shown]
[im 1/2]
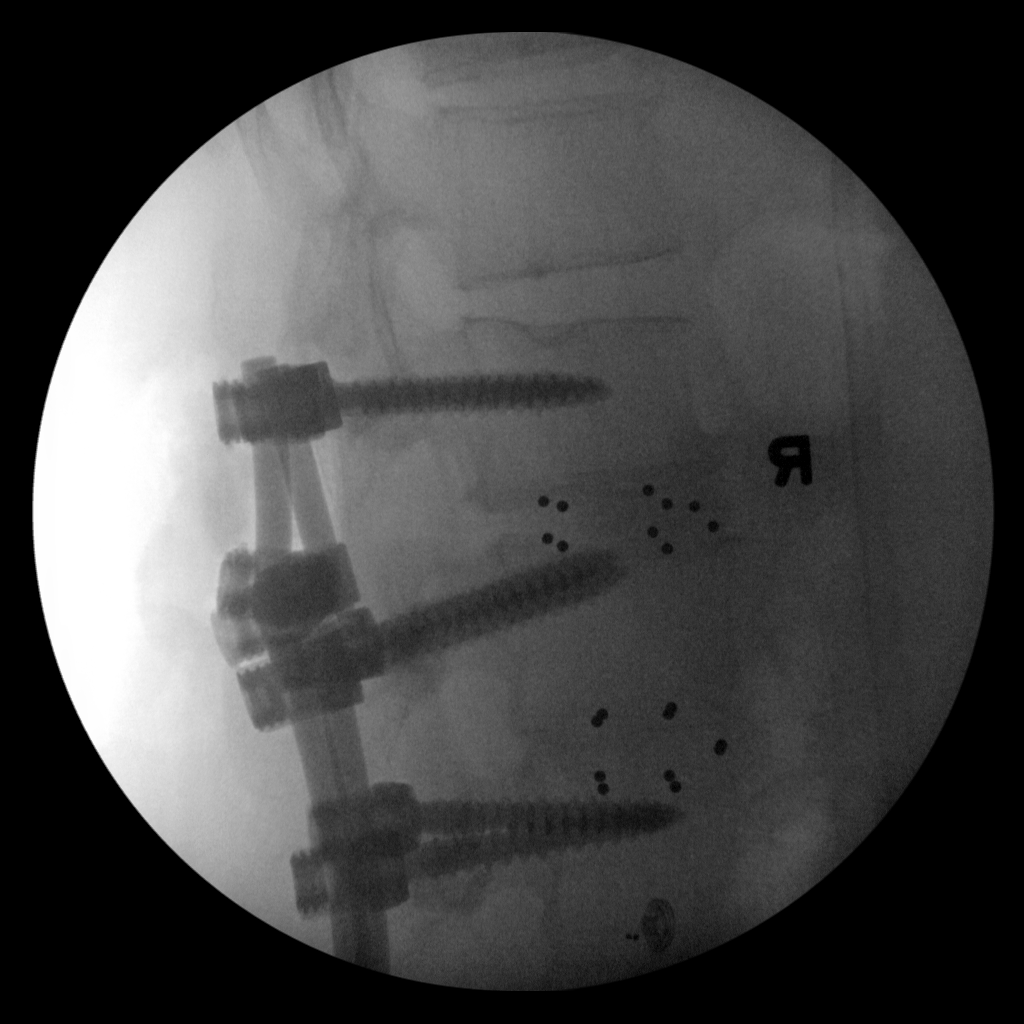
[im 2/2]
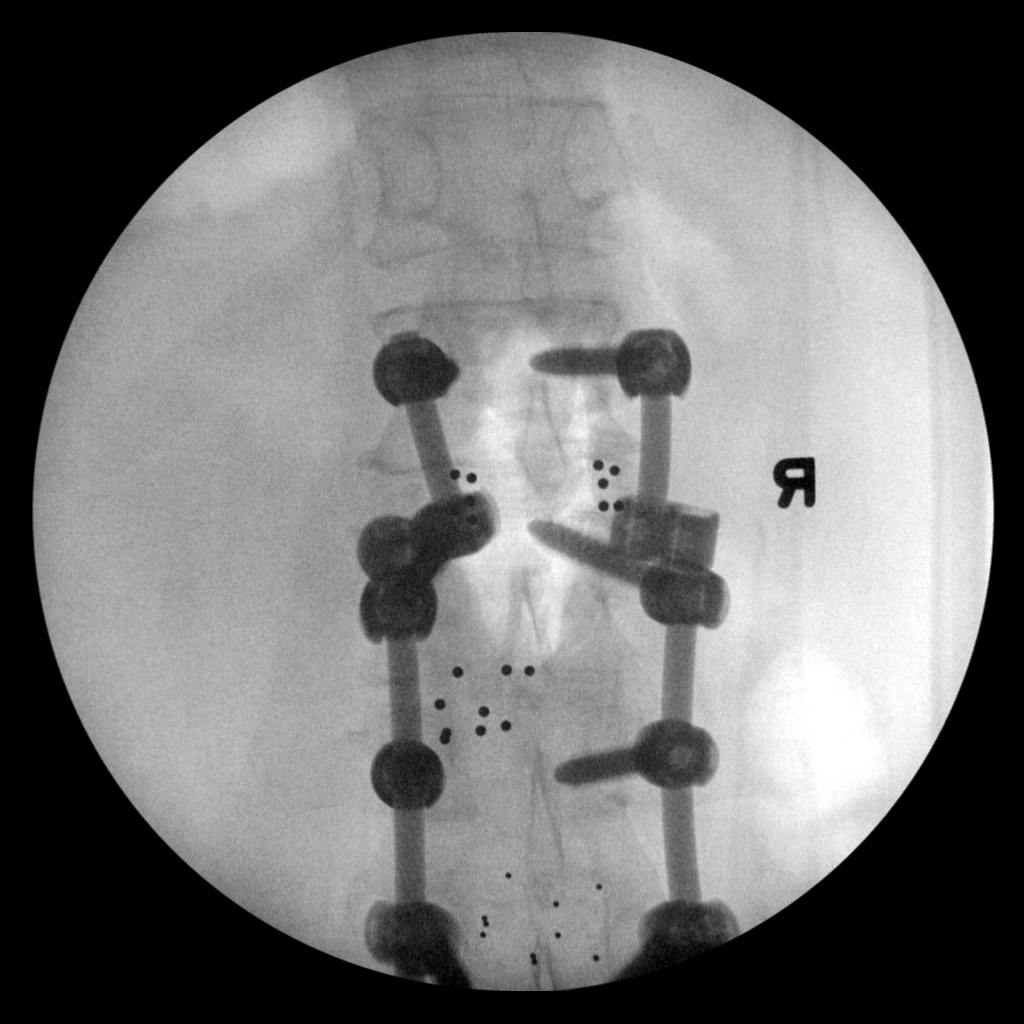

[2 of 2 positions shown; findings below may reference images not displayed]

FINDINGS: Pre-existing interbody cages and posterior spinal fixation rods are
again seen from levels of L2-L5.

New interbody cages and posterior spinal fixation rods are now seen
at level of L1-2. Lumbar alignment appears normal at this level.
IMPRESSION: New posterior lumbar interbody fusion at L1-2, with normal
alignment.

## 2016-03-15 DIAGNOSIS — R112 Nausea with vomiting, unspecified: Secondary | ICD-10-CM | POA: Diagnosis not present

## 2016-03-15 DIAGNOSIS — R197 Diarrhea, unspecified: Secondary | ICD-10-CM | POA: Diagnosis not present

## 2016-03-22 DIAGNOSIS — M4726 Other spondylosis with radiculopathy, lumbar region: Secondary | ICD-10-CM | POA: Diagnosis not present

## 2016-03-22 DIAGNOSIS — M48061 Spinal stenosis, lumbar region without neurogenic claudication: Secondary | ICD-10-CM | POA: Diagnosis not present

## 2016-03-22 DIAGNOSIS — M5136 Other intervertebral disc degeneration, lumbar region: Secondary | ICD-10-CM | POA: Diagnosis not present

## 2016-04-10 DIAGNOSIS — M5416 Radiculopathy, lumbar region: Secondary | ICD-10-CM | POA: Diagnosis not present

## 2016-04-10 DIAGNOSIS — M5412 Radiculopathy, cervical region: Secondary | ICD-10-CM | POA: Diagnosis not present

## 2016-05-10 DIAGNOSIS — M5416 Radiculopathy, lumbar region: Secondary | ICD-10-CM | POA: Diagnosis not present

## 2016-07-16 DIAGNOSIS — R946 Abnormal results of thyroid function studies: Secondary | ICD-10-CM | POA: Diagnosis not present

## 2016-07-16 DIAGNOSIS — E782 Mixed hyperlipidemia: Secondary | ICD-10-CM | POA: Diagnosis not present

## 2016-07-16 DIAGNOSIS — R7301 Impaired fasting glucose: Secondary | ICD-10-CM | POA: Diagnosis not present

## 2016-07-16 DIAGNOSIS — I10 Essential (primary) hypertension: Secondary | ICD-10-CM | POA: Diagnosis not present

## 2016-07-16 DIAGNOSIS — N952 Postmenopausal atrophic vaginitis: Secondary | ICD-10-CM | POA: Diagnosis not present

## 2016-07-16 DIAGNOSIS — E038 Other specified hypothyroidism: Secondary | ICD-10-CM | POA: Diagnosis not present

## 2016-07-25 DIAGNOSIS — M5412 Radiculopathy, cervical region: Secondary | ICD-10-CM | POA: Diagnosis not present

## 2016-08-29 DIAGNOSIS — Z6835 Body mass index (BMI) 35.0-35.9, adult: Secondary | ICD-10-CM | POA: Diagnosis not present

## 2016-08-29 DIAGNOSIS — L247 Irritant contact dermatitis due to plants, except food: Secondary | ICD-10-CM | POA: Diagnosis not present

## 2016-08-29 DIAGNOSIS — Z1211 Encounter for screening for malignant neoplasm of colon: Secondary | ICD-10-CM | POA: Diagnosis not present

## 2016-08-29 DIAGNOSIS — E890 Postprocedural hypothyroidism: Secondary | ICD-10-CM | POA: Diagnosis not present

## 2016-08-29 DIAGNOSIS — R600 Localized edema: Secondary | ICD-10-CM | POA: Diagnosis not present

## 2016-08-29 DIAGNOSIS — Z0001 Encounter for general adult medical examination with abnormal findings: Secondary | ICD-10-CM | POA: Diagnosis not present

## 2016-08-29 DIAGNOSIS — R5383 Other fatigue: Secondary | ICD-10-CM | POA: Diagnosis not present

## 2016-08-29 DIAGNOSIS — E782 Mixed hyperlipidemia: Secondary | ICD-10-CM | POA: Diagnosis not present

## 2016-08-29 DIAGNOSIS — Z1231 Encounter for screening mammogram for malignant neoplasm of breast: Secondary | ICD-10-CM | POA: Diagnosis not present

## 2016-09-06 DIAGNOSIS — R5383 Other fatigue: Secondary | ICD-10-CM | POA: Diagnosis not present

## 2016-09-06 DIAGNOSIS — Z1231 Encounter for screening mammogram for malignant neoplasm of breast: Secondary | ICD-10-CM | POA: Diagnosis not present

## 2016-09-06 DIAGNOSIS — R600 Localized edema: Secondary | ICD-10-CM | POA: Diagnosis not present

## 2016-09-11 DIAGNOSIS — J060 Acute laryngopharyngitis: Secondary | ICD-10-CM | POA: Diagnosis not present

## 2016-09-11 DIAGNOSIS — R5383 Other fatigue: Secondary | ICD-10-CM | POA: Diagnosis not present

## 2016-09-11 DIAGNOSIS — R002 Palpitations: Secondary | ICD-10-CM | POA: Diagnosis not present

## 2016-09-17 DIAGNOSIS — R002 Palpitations: Secondary | ICD-10-CM | POA: Diagnosis not present

## 2016-09-23 DIAGNOSIS — I10 Essential (primary) hypertension: Secondary | ICD-10-CM | POA: Diagnosis not present

## 2016-09-23 DIAGNOSIS — E038 Other specified hypothyroidism: Secondary | ICD-10-CM | POA: Diagnosis not present

## 2016-09-23 DIAGNOSIS — R002 Palpitations: Secondary | ICD-10-CM | POA: Diagnosis not present

## 2016-09-23 DIAGNOSIS — R600 Localized edema: Secondary | ICD-10-CM | POA: Diagnosis not present

## 2016-09-23 DIAGNOSIS — I471 Supraventricular tachycardia: Secondary | ICD-10-CM | POA: Diagnosis not present

## 2016-09-23 DIAGNOSIS — L247 Irritant contact dermatitis due to plants, except food: Secondary | ICD-10-CM | POA: Diagnosis not present

## 2016-09-23 DIAGNOSIS — E782 Mixed hyperlipidemia: Secondary | ICD-10-CM | POA: Diagnosis not present

## 2016-09-27 DIAGNOSIS — E038 Other specified hypothyroidism: Secondary | ICD-10-CM | POA: Diagnosis not present

## 2016-09-27 DIAGNOSIS — L247 Irritant contact dermatitis due to plants, except food: Secondary | ICD-10-CM | POA: Diagnosis not present

## 2016-10-16 DIAGNOSIS — H52223 Regular astigmatism, bilateral: Secondary | ICD-10-CM | POA: Diagnosis not present

## 2016-10-16 DIAGNOSIS — E119 Type 2 diabetes mellitus without complications: Secondary | ICD-10-CM | POA: Diagnosis not present

## 2016-11-11 DIAGNOSIS — I471 Supraventricular tachycardia: Secondary | ICD-10-CM | POA: Diagnosis not present

## 2016-11-11 DIAGNOSIS — E782 Mixed hyperlipidemia: Secondary | ICD-10-CM | POA: Diagnosis not present

## 2016-11-11 DIAGNOSIS — R600 Localized edema: Secondary | ICD-10-CM | POA: Diagnosis not present

## 2016-11-11 DIAGNOSIS — E038 Other specified hypothyroidism: Secondary | ICD-10-CM | POA: Diagnosis not present

## 2016-11-11 DIAGNOSIS — I10 Essential (primary) hypertension: Secondary | ICD-10-CM | POA: Diagnosis not present

## 2016-12-24 DIAGNOSIS — R600 Localized edema: Secondary | ICD-10-CM | POA: Diagnosis not present

## 2016-12-24 DIAGNOSIS — Z23 Encounter for immunization: Secondary | ICD-10-CM | POA: Diagnosis not present

## 2016-12-24 DIAGNOSIS — E038 Other specified hypothyroidism: Secondary | ICD-10-CM | POA: Diagnosis not present

## 2016-12-24 DIAGNOSIS — E559 Vitamin D deficiency, unspecified: Secondary | ICD-10-CM | POA: Diagnosis not present

## 2016-12-24 DIAGNOSIS — I10 Essential (primary) hypertension: Secondary | ICD-10-CM | POA: Diagnosis not present

## 2016-12-24 DIAGNOSIS — I471 Supraventricular tachycardia: Secondary | ICD-10-CM | POA: Diagnosis not present

## 2017-03-04 DIAGNOSIS — I639 Cerebral infarction, unspecified: Secondary | ICD-10-CM

## 2017-03-04 HISTORY — DX: Cerebral infarction, unspecified: I63.9

## 2017-05-27 DIAGNOSIS — R600 Localized edema: Secondary | ICD-10-CM | POA: Diagnosis not present

## 2017-05-27 DIAGNOSIS — R7309 Other abnormal glucose: Secondary | ICD-10-CM | POA: Diagnosis not present

## 2017-05-27 DIAGNOSIS — E038 Other specified hypothyroidism: Secondary | ICD-10-CM | POA: Diagnosis not present

## 2017-05-27 DIAGNOSIS — Z6837 Body mass index (BMI) 37.0-37.9, adult: Secondary | ICD-10-CM | POA: Diagnosis not present

## 2017-05-27 DIAGNOSIS — E782 Mixed hyperlipidemia: Secondary | ICD-10-CM | POA: Diagnosis not present

## 2017-05-27 DIAGNOSIS — R7301 Impaired fasting glucose: Secondary | ICD-10-CM | POA: Diagnosis not present

## 2017-05-27 DIAGNOSIS — I10 Essential (primary) hypertension: Secondary | ICD-10-CM | POA: Diagnosis not present

## 2017-05-30 DIAGNOSIS — M47892 Other spondylosis, cervical region: Secondary | ICD-10-CM | POA: Diagnosis not present

## 2017-05-30 DIAGNOSIS — M25512 Pain in left shoulder: Secondary | ICD-10-CM | POA: Diagnosis not present

## 2017-06-04 DIAGNOSIS — M25512 Pain in left shoulder: Secondary | ICD-10-CM | POA: Diagnosis not present

## 2017-06-04 DIAGNOSIS — M542 Cervicalgia: Secondary | ICD-10-CM | POA: Diagnosis not present

## 2017-06-10 DIAGNOSIS — M542 Cervicalgia: Secondary | ICD-10-CM | POA: Diagnosis not present

## 2017-06-10 DIAGNOSIS — M25512 Pain in left shoulder: Secondary | ICD-10-CM | POA: Diagnosis not present

## 2017-06-13 DIAGNOSIS — M25512 Pain in left shoulder: Secondary | ICD-10-CM | POA: Diagnosis not present

## 2017-06-13 DIAGNOSIS — M542 Cervicalgia: Secondary | ICD-10-CM | POA: Diagnosis not present

## 2017-06-16 DIAGNOSIS — M25512 Pain in left shoulder: Secondary | ICD-10-CM | POA: Diagnosis not present

## 2017-06-16 DIAGNOSIS — M542 Cervicalgia: Secondary | ICD-10-CM | POA: Diagnosis not present

## 2017-06-18 DIAGNOSIS — M25512 Pain in left shoulder: Secondary | ICD-10-CM | POA: Diagnosis not present

## 2017-06-18 DIAGNOSIS — M542 Cervicalgia: Secondary | ICD-10-CM | POA: Diagnosis not present

## 2017-06-25 DIAGNOSIS — M542 Cervicalgia: Secondary | ICD-10-CM | POA: Diagnosis not present

## 2017-06-25 DIAGNOSIS — M25512 Pain in left shoulder: Secondary | ICD-10-CM | POA: Diagnosis not present

## 2017-06-27 DIAGNOSIS — M25512 Pain in left shoulder: Secondary | ICD-10-CM | POA: Diagnosis not present

## 2017-06-27 DIAGNOSIS — M542 Cervicalgia: Secondary | ICD-10-CM | POA: Diagnosis not present

## 2017-07-01 DIAGNOSIS — M542 Cervicalgia: Secondary | ICD-10-CM | POA: Diagnosis not present

## 2017-07-01 DIAGNOSIS — M25512 Pain in left shoulder: Secondary | ICD-10-CM | POA: Diagnosis not present

## 2017-07-04 DIAGNOSIS — M25512 Pain in left shoulder: Secondary | ICD-10-CM | POA: Diagnosis not present

## 2017-07-04 DIAGNOSIS — M542 Cervicalgia: Secondary | ICD-10-CM | POA: Diagnosis not present

## 2017-07-08 DIAGNOSIS — M25512 Pain in left shoulder: Secondary | ICD-10-CM | POA: Diagnosis not present

## 2017-07-08 DIAGNOSIS — M542 Cervicalgia: Secondary | ICD-10-CM | POA: Diagnosis not present

## 2017-07-09 DIAGNOSIS — M5412 Radiculopathy, cervical region: Secondary | ICD-10-CM | POA: Diagnosis not present

## 2017-07-09 DIAGNOSIS — M25512 Pain in left shoulder: Secondary | ICD-10-CM | POA: Diagnosis not present

## 2017-07-11 DIAGNOSIS — M25512 Pain in left shoulder: Secondary | ICD-10-CM | POA: Diagnosis not present

## 2017-07-11 DIAGNOSIS — M542 Cervicalgia: Secondary | ICD-10-CM | POA: Diagnosis not present

## 2017-07-17 DIAGNOSIS — M25512 Pain in left shoulder: Secondary | ICD-10-CM | POA: Diagnosis not present

## 2017-07-17 DIAGNOSIS — M542 Cervicalgia: Secondary | ICD-10-CM | POA: Diagnosis not present

## 2017-07-24 DIAGNOSIS — M542 Cervicalgia: Secondary | ICD-10-CM | POA: Diagnosis not present

## 2017-07-24 DIAGNOSIS — M25512 Pain in left shoulder: Secondary | ICD-10-CM | POA: Diagnosis not present

## 2017-07-30 DIAGNOSIS — M542 Cervicalgia: Secondary | ICD-10-CM | POA: Diagnosis not present

## 2017-07-30 DIAGNOSIS — M25512 Pain in left shoulder: Secondary | ICD-10-CM | POA: Diagnosis not present

## 2017-08-06 DIAGNOSIS — M542 Cervicalgia: Secondary | ICD-10-CM | POA: Diagnosis not present

## 2017-08-06 DIAGNOSIS — M25512 Pain in left shoulder: Secondary | ICD-10-CM | POA: Diagnosis not present

## 2017-08-13 DIAGNOSIS — M25512 Pain in left shoulder: Secondary | ICD-10-CM | POA: Diagnosis not present

## 2017-08-13 DIAGNOSIS — M542 Cervicalgia: Secondary | ICD-10-CM | POA: Diagnosis not present

## 2017-09-15 DIAGNOSIS — E782 Mixed hyperlipidemia: Secondary | ICD-10-CM | POA: Diagnosis not present

## 2017-09-15 DIAGNOSIS — I10 Essential (primary) hypertension: Secondary | ICD-10-CM | POA: Diagnosis not present

## 2017-09-15 DIAGNOSIS — R600 Localized edema: Secondary | ICD-10-CM | POA: Diagnosis not present

## 2017-09-15 DIAGNOSIS — R7309 Other abnormal glucose: Secondary | ICD-10-CM | POA: Diagnosis not present

## 2017-09-15 DIAGNOSIS — E038 Other specified hypothyroidism: Secondary | ICD-10-CM | POA: Diagnosis not present

## 2017-09-15 DIAGNOSIS — I471 Supraventricular tachycardia: Secondary | ICD-10-CM | POA: Diagnosis not present

## 2017-09-15 DIAGNOSIS — Z6838 Body mass index (BMI) 38.0-38.9, adult: Secondary | ICD-10-CM | POA: Diagnosis not present

## 2017-10-20 DIAGNOSIS — H524 Presbyopia: Secondary | ICD-10-CM | POA: Diagnosis not present

## 2017-10-20 DIAGNOSIS — H53453 Other localized visual field defect, bilateral: Secondary | ICD-10-CM | POA: Diagnosis not present

## 2017-10-20 DIAGNOSIS — E119 Type 2 diabetes mellitus without complications: Secondary | ICD-10-CM | POA: Diagnosis not present

## 2017-12-15 DIAGNOSIS — H53453 Other localized visual field defect, bilateral: Secondary | ICD-10-CM | POA: Diagnosis not present

## 2017-12-31 DIAGNOSIS — H53462 Homonymous bilateral field defects, left side: Secondary | ICD-10-CM | POA: Diagnosis not present

## 2018-01-01 DIAGNOSIS — I639 Cerebral infarction, unspecified: Secondary | ICD-10-CM

## 2018-01-01 DIAGNOSIS — H53452 Other localized visual field defect, left eye: Secondary | ICD-10-CM | POA: Diagnosis not present

## 2018-01-01 DIAGNOSIS — R51 Headache: Secondary | ICD-10-CM | POA: Diagnosis not present

## 2018-01-01 DIAGNOSIS — H538 Other visual disturbances: Secondary | ICD-10-CM | POA: Diagnosis not present

## 2018-01-01 DIAGNOSIS — H53462 Homonymous bilateral field defects, left side: Secondary | ICD-10-CM | POA: Diagnosis not present

## 2018-01-01 HISTORY — DX: Cerebral infarction, unspecified: I63.9

## 2018-01-12 DIAGNOSIS — Z0001 Encounter for general adult medical examination with abnormal findings: Secondary | ICD-10-CM | POA: Diagnosis not present

## 2018-01-12 DIAGNOSIS — Z23 Encounter for immunization: Secondary | ICD-10-CM | POA: Diagnosis not present

## 2018-01-12 DIAGNOSIS — R600 Localized edema: Secondary | ICD-10-CM | POA: Diagnosis not present

## 2018-01-12 DIAGNOSIS — G4709 Other insomnia: Secondary | ICD-10-CM | POA: Diagnosis not present

## 2018-01-12 DIAGNOSIS — Z6838 Body mass index (BMI) 38.0-38.9, adult: Secondary | ICD-10-CM | POA: Diagnosis not present

## 2018-01-12 DIAGNOSIS — Z1231 Encounter for screening mammogram for malignant neoplasm of breast: Secondary | ICD-10-CM | POA: Diagnosis not present

## 2018-01-12 DIAGNOSIS — Z1211 Encounter for screening for malignant neoplasm of colon: Secondary | ICD-10-CM | POA: Diagnosis not present

## 2018-01-12 DIAGNOSIS — R5383 Other fatigue: Secondary | ICD-10-CM | POA: Diagnosis not present

## 2018-02-05 ENCOUNTER — Ambulatory Visit: Payer: PPO | Admitting: Neurology

## 2018-02-16 ENCOUNTER — Ambulatory Visit: Payer: PPO | Admitting: Neurology

## 2018-02-16 ENCOUNTER — Encounter: Payer: Self-pay | Admitting: Neurology

## 2018-02-16 ENCOUNTER — Telehealth: Payer: Self-pay | Admitting: Neurology

## 2018-02-16 VITALS — BP 127/81 | HR 78 | Ht 65.0 in | Wt 231.4 lb

## 2018-02-16 DIAGNOSIS — I639 Cerebral infarction, unspecified: Secondary | ICD-10-CM | POA: Diagnosis not present

## 2018-02-16 DIAGNOSIS — H53462 Homonymous bilateral field defects, left side: Secondary | ICD-10-CM

## 2018-02-16 NOTE — Progress Notes (Signed)
[facility] [address] [place]. [place] [address] [phone]       OFFICE CONSULT NOTE  [name] Date of Birth:  11-Oct-1955 Medical Record Number:  [id number]   Referring MD: Kristeen Miss Reason for Referral:  Right occipital infarct  HPI: Caitlin Ho is a pleasant 62 year old Caucasian lady seen today for initial office consultation visit.  She is accompanied by her husband.  History is obtained from them and review of referral notes and electronic medical records.  I have personally reviewed imaging films in PACS.  Patient states she has had no definite symptoms suggestive of stroke however during a routine eye exam in August she was noted by the ophthalmologist to have left homonymous hemianopsia.  Surprisingly she is not known about this or had any any vision difficulties.  She has not been bumping into objects and does not say that she really feels people are creeping up on her.  She has had no accidents or injuries.  She had a previous eye exam a year ago where this was clearly not found.  Patient saw Dr. Ellene Route neurosurgeon who has done cervical spine surgery in October 17 as well as multiple back surgeries in the past.  I personally reviewed imaging films of MRI scan of the brain with and without contrast that she had on 01/01/2018 which shows encephalomalacia and gliosis in the right medial occipital lobe likely an old stroke.  No vascular imaging , echocardiogram or stroke specific labs wer done.  Patient denies any history of headaches, significant left-sided peripheral vision loss.  In fact she has been driving without accidents.  She denies any double vision, vertigo, gait or balance problems.  She denies any memory difficulties and has been able to do all her activities of daily living.  There is no prior history of strokes TIAs seizures or significant neurological problems.  Her dad died of MI in his 18s.  Patient does give a history of several days of  palpitations in the past for which he underwent Holter monitoring and she was found to have a short beat of V. tach and metoprolol had been prescribed with the patient chose not to take it.  There is no definite history of atrial fibrillation.  She does admit to snoring and has restless sleep and husband feels convinced that she may have sleep apnea.  She has not been formally tested for the same but is willing to be referred for evaluation now.  There is no history of deep vein thrombosis pulmonary embolism or rash.  She has not had any evaluation for stroke yet.  She has recently been started on aspirin which is tolerating well.  She does not smoke or drink.  ROS:   14 system review of systems is positive for palpitations, loss of vision, snoring and all other systems negative  PMH:  Past Medical History:  Diagnosis Date  . Cancer ([place])    cervical cancer cells whwn patient was 22   . Diabetes mellitus without complication (HCC)    borderline  . GERD (gastroesophageal reflux disease)    not on medication  . Headache(784.0)    sinus  . Homonymous hemianopia   . Hypertension   . Hypothyroid   . PONV (postoperative nausea and vomiting)    06/20/14 patient reported that BP dropped real low and she had N/V  and was slow to awaken when she had surgery at an outpatient center.  . Seasonal allergies   .  Stroke [facility])     Social History:  Social History   Socioeconomic History  . Marital status: Married    Spouse name: Not on file  . Number of children: Not on file  . Years of education: Not on file  . Highest education level: Not on file  Occupational History  . Not on file  Social Needs  . Financial resource strain: Not on file  . Food insecurity:    Worry: Not on file    Inability: Not on file  . Transportation needs:    Medical: Not on file    Non-medical: Not on file  Tobacco Use  . Smoking status: Never Smoker  . Smokeless tobacco: Never Used  Substance and Sexual Activity   . Alcohol use: No  . Drug use: No  . Sexual activity: Not Currently    Birth control/protection: Abstinence  Lifestyle  . Physical activity:    Days per week: Not on file    Minutes per session: Not on file  . Stress: Not on file  Relationships  . Social connections:    Talks on phone: Not on file    Gets together: Not on file    Attends religious service: Not on file    Active member of club or organization: Not on file    Attends meetings of clubs or organizations: Not on file    Relationship status: Not on file  . Intimate partner violence:    Fear of current or ex partner: Not on file    Emotionally abused: Not on file    Physically abused: Not on file    Forced sexual activity: Not on file  Other Topics Concern  . Not on file  Social History Narrative  . Not on file    Medications:   Current Outpatient Medications on File Prior to Visit  Medication Sig Dispense Refill  . aspirin EC 81 MG tablet Take 81 mg by mouth daily.    . furosemide (LASIX) 40 MG tablet Take 40 mg by mouth.    . gabapentin (NEURONTIN) 300 MG capsule Take 1 capsule (300 mg total) by mouth 3 (three) times daily. 100 capsule 5  . levothyroxine (SYNTHROID, LEVOTHROID) 125 MCG tablet Take 125 mcg by mouth daily.  0  . meloxicam (MOBIC) 15 MG tablet Take 15 mg by mouth daily.    . methocarbamol (ROBAXIN) 500 MG tablet Take 1 tablet (500 mg total) by mouth every 6 (six) hours as needed for muscle spasms. 60 tablet 3  . polyethylene glycol (MIRALAX / GLYCOLAX) packet Take 17 g by mouth daily as needed.    Marland Kitchen scopolamine (TRANSDERM-SCOP) 1 MG/3DAYS Place 1 patch onto the skin every 3 (three) days.    . metoprolol tartrate (LOPRESSOR) 25 MG tablet Take 25 mg by mouth 2 (two) times daily.  2  . traMADol (ULTRAM) 50 MG tablet TAKE 1 OR 2 TABLETS BY MOUTH EVERY 6 HOURS AS NEEDED  1   No current facility-administered medications on file prior to visit.     Allergies:  No Known Allergies  Physical  Exam General: well developed, well nourished middle-aged Caucasian lady, seated, in no evident distress Head: head normocephalic and atraumatic.   Neck: supple with no carotid or supraclavicular bruits Cardiovascular: regular rate and rhythm, no murmurs Musculoskeletal: no deformity Skin:  no rash/petichiae Vascular:  Normal pulses all extremities  Neurologic Exam Mental Status: Awake and fully alert. Oriented to place and time. Recent and remote memory intact. Attention  span, concentration and fund of knowledge appropriate. Mood and affect appropriate.  Recall 3/3.  Able to name 10 animals with [employer].  Clock drawing 4/4. Cranial Nerves: Fundoscopic exam reveals sharp disc margins. Pupils equal, briskly reactive to light. Extraocular movements full without nystagmus. Visual fields showed dense left homonymous hemianopsia to confrontation. Hearing intact. Facial sensation intact. Face, tongue, palate moves normally and symmetrically.  Motor: Normal bulk and tone. Normal strength in all tested extremity muscles. Sensory.: intact to touch , pinprick , position and vibratory sensation.  Coordination: Rapid alternating movements normal in all extremities. Finger-to-nose and heel-to-shin performed accurately bilaterally. Gait and Station: Arises from chair without difficulty. Stance is normal. Gait demonstrates normal stride length and balance but favors the back due to back pain.. Able to heel, toe and tandem walk with slight difficulty.  Reflexes: 1+ and symmetric. Toes downgoing.   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     PLAN: I had a long d/w patient and her husband about her silent left homonymous hemianopia likely from silent right occipital stroke  , risk for recurrent stroke/TIAs, personally independently reviewed imaging studies and stroke evaluation results and  answered questions.Continue aspirin 81 mg daily  for secondary stroke prevention and maintain strict control of hypertension with blood pressure goal below 130/90, diabetes with hemoglobin A1c goal below 6.5% and lipids with LDL cholesterol goal below 70 mg/dL. I also advised the patient to eat a healthy diet with plenty of whole grains, cereals, fruits and vegetables, exercise regularly and maintain ideal body weight.Check MRA brain and neck, Polysmonogram for sleep apnoea, lipids and HbA1c.She was advised not to drive till her vision loss improves.  Greater than 50% time during this 45-minute consultation visit was spent on counseling and coordination of care about her silent occipital infarct and discussion about stroke prevention and treatment.  Followup in the future with me in 2 months or call earlier if necessary. Antony Contras, MD  [facility] [address] [place], [place] [address]  Phone [phone] Fax [phone]  Note: This document was prepared with digital dictation and possible smart phrase technology. Any transcriptional errors that result from this process are unintentional.

## 2018-02-16 NOTE — Patient Instructions (Signed)
I had a long d/w patient and her husband about her silent left homonymous hemianopia likely from silent right occipital stroke  , risk for recurrent stroke/TIAs, personally independently reviewed imaging studies and stroke evaluation results and answered questions.Continue aspirin 81 mg daily  for secondary stroke prevention and maintain strict control of hypertension with blood pressure goal below 130/90, diabetes with hemoglobin A1c goal below 6.5% and lipids with LDL cholesterol goal below 70 mg/dL. I also advised the patient to eat a healthy diet with plenty of whole grains, cereals, fruits and vegetables, exercise regularly and maintain ideal body weight.Check MRA brain and neck, Polysmonogram for sleep apnoea, lipids and HbA1c.She was advised not to drive till her vision loss improves. Followup in the future with me in 2 months or call earlier if necessary.  Hemianopia Hemianopia, also called hemiopia or hemianopsia, is partial vision loss in the right or left half of the eye. This may affect one or both eyes. It is often caused by a stroke or brain injury. This condition causes trouble seeing and can make it difficult to do everyday tasks. What are the causes? This condition may be caused by:  Stroke.  Brain or head injury.  Brain tumor.  Bleeding in the brain.  Brain surgery.  Certain medical conditions, such as multiple sclerosis.  What are the signs or symptoms? This condition causes vision loss in the right or left half of the field of vision, in one or both eyes. As a result, you may:  Have problems reading or writing.  Have frequent falls or stumbles because you cannot see well.  Bump into people or things.  Knock things over frequently.  Have problems driving.  Have problems seeing or using common things.  Get surprised by people or things that seem to appear suddenly.  How is this diagnosed? This condition may be diagnosed by:  A physical exam, medical history,  and your symptoms.  An eye exam.  A visual field test to check your vision in each part of your eye.  Imaging tests of your brain, such as CT scan or MRI.  You may be referred to an eye doctor who specializes in eye problems that are related to the brain (neuro-ophthalmologist). How is this treated? Treatment for this condition may include:  Eye exercises.  Scanning therapy. This teaches you to look side-to-side so you can see what is in your blind spot.  Vision restoration therapy (VRT). This type of therapy involves using a computer to do eye exercises to help your eyes focus.  Special devices to read or do other tasks.  Special glasses with prisms to help your vision.  Reading strategies to help you read, such as turning the text to the side or using a straight edge to follow the text.  Follow these instructions at home: Lifestyle  Make changes to your home to avoid falls and injuries. This may include: ? Removing rugs and mats from the floor. ? Removing clutter. ? Adding rails or grab bars in the bathroom.  Do not drive or use heavy machinery until your health care provider approves.  If you have trouble with basic or daily tasks such as cooking, bathing, or washing, ask for help. General instructions  Do not use any products that contain nicotine or tobacco, such as cigarettes and e-cigarettes. If you need help quitting, ask your health care provider.  Take over-the-counter and prescription medicines only as told by your health care provider.  Do exercises and therapy  as directed. Use devices and glasses as told by your health care provider.  Keep all follow-up visits as told by your health care provider. This is important. Contact a health care provider if:  You lose more of your vision.  You become dizzy or you faint.  You have problems doing your eye exercises or other therapies. Get help right away if:  You have severe pain in one or both of your  eyes.  You cannot see at all.  You have a seizure. Summary  Hemianopia is partial vision loss in the right or left half of the eye. This may affect one or both eyes.  This condition is often caused by a stroke or brain injury.  You may need eye exercises, therapies, special glasses, and other treatments.  If you have trouble with basic or daily tasks such as cooking, bathing, or washing, ask for help. This information is not intended to replace advice given to you by your health care provider. Make sure you discuss any questions you have with your health care provider. Document Released: 05/27/2016 Document Revised: 05/27/2016 Document Reviewed: 05/27/2016 Elsevier Interactive Patient Education  2018 [employer].  Stroke Prevention Some medical conditions and behaviors are associated with a higher chance of having a stroke. You can help prevent a stroke by making nutrition, lifestyle, and other changes, including managing any medical conditions you may have. What nutrition changes can be made?  Eat healthy foods. You can do this by: ? Choosing foods high in fiber, such as fresh fruits and vegetables and whole grains. ? Eating at least 5 or more servings of fruits and vegetables a day. Try to fill half of your plate at each meal with fruits and vegetables. ? Choosing lean protein foods, such as lean cuts of meat, poultry without skin, fish, tofu, beans, and nuts. ? Eating low-fat dairy products. ? Avoiding foods that are high in salt (sodium). This can help lower blood pressure. ? Avoiding foods that have saturated fat, trans fat, and cholesterol. This can help prevent high cholesterol. ? Avoiding processed and premade foods.  Follow your health care provider's specific guidelines for losing weight, controlling high blood pressure (hypertension), lowering high cholesterol, and managing diabetes. These may include: ? Reducing your daily calorie intake. ? Limiting your daily sodium  intake to 1,500 milligrams (mg). ? Using only healthy fats for cooking, such as olive oil, canola oil, or sunflower oil. ? Counting your daily carbohydrate intake. What lifestyle changes can be made?  Maintain a healthy weight. Talk to your health care provider about your ideal weight.  Get at least 30 minutes of moderate physical activity at least 5 days a week. Moderate activity includes brisk walking, biking, and swimming.  Do not use any products that contain nicotine or tobacco, such as cigarettes and e-cigarettes. If you need help quitting, ask your health care provider. It may also be helpful to avoid exposure to secondhand smoke.  Limit alcohol intake to no more than 1 drink a day for nonpregnant women and 2 drinks a day for men. One drink equals 12 oz of beer, 5 oz of wine, or 1 oz of hard liquor.  Stop any illegal drug use.  Avoid taking birth control pills. Talk to your health care provider about the risks of taking birth control pills if: ? You are over 66 years old. ? You smoke. ? You get migraines. ? You have ever had a blood clot. What other changes can be made?  Manage your cholesterol levels. ? Eating a healthy diet is important for preventing high cholesterol. If cholesterol cannot be managed through diet alone, you may also need to take medicines. ? Take any prescribed medicines to control your cholesterol as told by your health care provider.  Manage your diabetes. ? Eating a healthy diet and exercising regularly are important parts of managing your blood sugar. If your blood sugar cannot be managed through diet and exercise, you may need to take medicines. ? Take any prescribed medicines to control your diabetes as told by your health care provider.  Control your hypertension. ? To reduce your risk of stroke, try to keep your blood pressure below 130/80. ? Eating a healthy diet and exercising regularly are an important part of controlling your blood pressure. If  your blood pressure cannot be managed through diet and exercise, you may need to take medicines. ? Take any prescribed medicines to control hypertension as told by your health care provider. ? Ask your health care provider if you should monitor your blood pressure at home. ? Have your blood pressure checked every year, even if your blood pressure is normal. Blood pressure increases with age and some medical conditions.  Get evaluated for sleep disorders (sleep apnea). Talk to your health care provider about getting a sleep evaluation if you snore a lot or have excessive sleepiness.  Take over-the-counter and prescription medicines only as told by your health care provider. Aspirin or blood thinners (antiplatelets or anticoagulants) may be recommended to reduce your risk of forming blood clots that can lead to stroke.  Make sure that any other medical conditions you have, such as atrial fibrillation or atherosclerosis, are managed. What are the warning signs of a stroke? The warning signs of a stroke can be easily remembered as BEFAST.  B is for balance. Signs include: ? Dizziness. ? Loss of balance or coordination. ? Sudden trouble walking.  E is for eyes. Signs include: ? A sudden change in vision. ? Trouble seeing.  F is for face. Signs include: ? Sudden weakness or numbness of the face. ? The face or eyelid drooping to one side.  A is for arms. Signs include: ? Sudden weakness or numbness of the arm, usually on one side of the body.  S is for speech. Signs include: ? Trouble speaking (aphasia). ? Trouble understanding.  T is for time. ? These symptoms may represent a serious problem that is an emergency. Do not wait to see if the symptoms will go away. Get medical help right away. Call your local emergency services (911 in the [place] Do not drive yourself to the hospital.  Other signs of stroke may include: ? A sudden, severe headache with no known cause. ? Nausea or  vomiting. ? Seizure.  Where to find more information: For more information, visit:  American Stroke Association: www.strokeassociation.org  National Stroke Association: www.stroke.org  Summary  You can prevent a stroke by eating healthy, exercising, not smoking, limiting alcohol intake, and managing any medical conditions you may have.  Do not use any products that contain nicotine or tobacco, such as cigarettes and e-cigarettes. If you need help quitting, ask your health care provider. It may also be helpful to avoid exposure to secondhand smoke.  Remember BEFAST for warning signs of stroke. Get help right away if you or a loved one has any of these signs. This information is not intended to replace advice given to you by your health care provider. Make sure  you discuss any questions you have with your health care provider. Document Released: 03/28/2004 Document Revised: 03/26/2016 Document Reviewed: 03/26/2016 Elsevier Interactive Patient Education  [employer].

## 2018-02-16 NOTE — Telephone Encounter (Signed)
health team order sent to [facility] for pt to be aware and left GI phone number of [phone] and to give them a call if she has not heard from them in the next 2-3 business days.

## 2018-02-17 LAB — LIPID PANEL
CHOL/HDL RATIO: 5 ratio — AB (ref 0.0–4.4)
Cholesterol, Total: 207 mg/dL — ABNORMAL HIGH (ref 100–199)
HDL: 41 mg/dL (ref >39)
LDL Calculated: 105 mg/dL — ABNORMAL HIGH (ref 0–99)
Triglycerides: 303 mg/dL — ABNORMAL HIGH (ref 0–149)
VLDL Cholesterol Cal: 61 mg/dL — ABNORMAL HIGH (ref 5–40)

## 2018-02-17 LAB — HEMOGLOBIN A1C
Est. average glucose Bld gHb Est-mCnc: 123 mg/dL
Hgb A1c MFr Bld: 5.9 % — ABNORMAL HIGH (ref 4.8–5.6)

## 2018-02-18 ENCOUNTER — Telehealth: Payer: Self-pay

## 2018-02-18 NOTE — Telephone Encounter (Signed)
-----   Message from Garvin Fila, MD sent at 02/17/2018 10:54 AM EST ----- Kindly inform patient that cholesterol and triglycerides are slightly high and to see primary MD for medication change. Diabetes screening test is satisfactory

## 2018-02-18 NOTE — Telephone Encounter (Signed)
I called Caitlin Ho and advised her of her lab results. Caitlin Ho asked that I send a copy of these labs to Dr. Tobie Poet. Caitlin Ho verbalized understanding of results and recommendations. Caitlin Ho had no questions at this time but was encouraged to call back if questions arise.

## 2018-02-20 ENCOUNTER — Other Ambulatory Visit: Payer: Self-pay

## 2018-02-20 ENCOUNTER — Encounter (HOSPITAL_COMMUNITY): Payer: Self-pay

## 2018-02-20 ENCOUNTER — Emergency Department (HOSPITAL_COMMUNITY): Payer: PPO

## 2018-02-20 ENCOUNTER — Observation Stay (HOSPITAL_COMMUNITY)
Admission: EM | Admit: 2018-02-20 | Discharge: 2018-02-21 | Disposition: A | Discharge status: Home / Self Care | Payer: PPO | Attending: Internal Medicine | Admitting: Internal Medicine

## 2018-02-20 ENCOUNTER — Observation Stay (HOSPITAL_BASED_OUTPATIENT_CLINIC_OR_DEPARTMENT_OTHER): Payer: PPO

## 2018-02-20 DIAGNOSIS — D3502 Benign neoplasm of left adrenal gland: Secondary | ICD-10-CM | POA: Diagnosis not present

## 2018-02-20 DIAGNOSIS — Z6837 Body mass index (BMI) 37.0-37.9, adult: Secondary | ICD-10-CM | POA: Diagnosis not present

## 2018-02-20 DIAGNOSIS — Z79890 Hormone replacement therapy (postmenopausal): Secondary | ICD-10-CM | POA: Diagnosis not present

## 2018-02-20 DIAGNOSIS — I119 Hypertensive heart disease without heart failure: Secondary | ICD-10-CM | POA: Insufficient documentation

## 2018-02-20 DIAGNOSIS — R079 Chest pain, unspecified: Secondary | ICD-10-CM

## 2018-02-20 DIAGNOSIS — K219 Gastro-esophageal reflux disease without esophagitis: Secondary | ICD-10-CM | POA: Insufficient documentation

## 2018-02-20 DIAGNOSIS — Z7982 Long term (current) use of aspirin: Secondary | ICD-10-CM | POA: Insufficient documentation

## 2018-02-20 DIAGNOSIS — R110 Nausea: Secondary | ICD-10-CM | POA: Diagnosis not present

## 2018-02-20 DIAGNOSIS — Z981 Arthrodesis status: Secondary | ICD-10-CM | POA: Diagnosis not present

## 2018-02-20 DIAGNOSIS — Z79891 Long term (current) use of opiate analgesic: Secondary | ICD-10-CM | POA: Diagnosis not present

## 2018-02-20 DIAGNOSIS — E890 Postprocedural hypothyroidism: Secondary | ICD-10-CM | POA: Insufficient documentation

## 2018-02-20 DIAGNOSIS — R7303 Prediabetes: Secondary | ICD-10-CM | POA: Insufficient documentation

## 2018-02-20 DIAGNOSIS — I700 Atherosclerosis of aorta: Secondary | ICD-10-CM | POA: Insufficient documentation

## 2018-02-20 DIAGNOSIS — Z79899 Other long term (current) drug therapy: Secondary | ICD-10-CM | POA: Insufficient documentation

## 2018-02-20 DIAGNOSIS — Z8673 Personal history of transient ischemic attack (TIA), and cerebral infarction without residual deficits: Secondary | ICD-10-CM | POA: Insufficient documentation

## 2018-02-20 DIAGNOSIS — Z9889 Other specified postprocedural states: Secondary | ICD-10-CM | POA: Insufficient documentation

## 2018-02-20 DIAGNOSIS — E669 Obesity, unspecified: Secondary | ICD-10-CM | POA: Diagnosis not present

## 2018-02-20 DIAGNOSIS — M549 Dorsalgia, unspecified: Secondary | ICD-10-CM | POA: Diagnosis not present

## 2018-02-20 DIAGNOSIS — Z8249 Family history of ischemic heart disease and other diseases of the circulatory system: Secondary | ICD-10-CM | POA: Insufficient documentation

## 2018-02-20 DIAGNOSIS — R0789 Other chest pain: Secondary | ICD-10-CM | POA: Diagnosis not present

## 2018-02-20 DIAGNOSIS — M542 Cervicalgia: Secondary | ICD-10-CM | POA: Diagnosis not present

## 2018-02-20 DIAGNOSIS — E785 Hyperlipidemia, unspecified: Secondary | ICD-10-CM | POA: Diagnosis not present

## 2018-02-20 DIAGNOSIS — Z8541 Personal history of malignant neoplasm of cervix uteri: Secondary | ICD-10-CM | POA: Diagnosis not present

## 2018-02-20 LAB — CBC WITH DIFFERENTIAL/PLATELET
Abs Immature Granulocytes: 0.04 10*3/uL (ref 0.00–0.07)
Basophils Absolute: 0.1 10*3/uL (ref 0.0–0.1)
Basophils Relative: 1 %
Eosinophils Absolute: 0.3 10*3/uL (ref 0.0–0.5)
Eosinophils Relative: 3 %
HCT: 44.9 % (ref 36.0–46.0)
HEMOGLOBIN: 14.5 g/dL (ref 12.0–15.0)
Immature Granulocytes: 0 %
LYMPHS PCT: 30 %
Lymphs Abs: 3.1 10*3/uL (ref 0.7–4.0)
MCH: 29.7 pg (ref 26.0–34.0)
MCHC: 32.3 g/dL (ref 30.0–36.0)
MCV: 91.8 fL (ref 80.0–100.0)
MONO ABS: 0.8 10*3/uL (ref 0.1–1.0)
Monocytes Relative: 7 %
NEUTROS ABS: 6.2 10*3/uL (ref 1.7–7.7)
Neutrophils Relative %: 59 %
Platelets: 322 10*3/uL (ref 150–400)
RBC: 4.89 MIL/uL (ref 3.87–5.11)
RDW: 12.4 % (ref 11.5–15.5)
WBC: 10.4 10*3/uL (ref 4.0–10.5)
nRBC: 0 % (ref 0.0–0.2)

## 2018-02-20 LAB — I-STAT TROPONIN, ED
Troponin i, poc: 0 ng/mL (ref 0.00–0.08)
Troponin i, poc: 0 ng/mL (ref 0.00–0.08)

## 2018-02-20 LAB — COMPREHENSIVE METABOLIC PANEL
ALK PHOS: 57 U/L (ref 38–126)
ALT: 14 U/L (ref 0–44)
AST: 18 U/L (ref 15–41)
Albumin: 3.5 g/dL (ref 3.5–5.0)
Anion gap: 11 (ref 5–15)
BUN: 16 mg/dL (ref 6–20)
CO2: 25 mmol/L (ref 22–32)
CREATININE: 0.97 mg/dL (ref 0.44–1.00)
Calcium: 9.4 mg/dL (ref 8.9–10.3)
Chloride: 106 mmol/L (ref 98–111)
GFR calc non Af Amer: >60 mL/min (ref >60)
Glucose, Bld: 170 mg/dL — ABNORMAL HIGH (ref 70–99)
Potassium: 3.9 mmol/L (ref 3.5–5.1)
Sodium: 142 mmol/L (ref 135–145)
Total Bilirubin: 0.5 mg/dL (ref 0.3–1.2)
Total Protein: 6.7 g/dL (ref 6.5–8.1)

## 2018-02-20 LAB — TROPONIN I
Troponin I: <0.03 ng/mL (ref <0.03)
Troponin I: <0.03 ng/mL (ref <0.03)

## 2018-02-20 LAB — ECHOCARDIOGRAM COMPLETE

## 2018-02-20 MED ORDER — ACETAMINOPHEN 500 MG PO TABS
1000.0000 mg | ORAL_TABLET | Freq: Once | ORAL | Status: AC
  Administered 2018-02-20: 1000 mg via ORAL
  Filled 2018-02-20: qty 2

## 2018-02-20 MED ORDER — METHOCARBAMOL 500 MG PO TABS
500.0000 mg | ORAL_TABLET | Freq: Four times a day (QID) | ORAL | Status: DC | PRN
  Administered 2018-02-20: 500 mg via ORAL
  Filled 2018-02-20: qty 1

## 2018-02-20 MED ORDER — ASPIRIN EC 81 MG PO TBEC
81.0000 mg | DELAYED_RELEASE_TABLET | Freq: Every day | ORAL | Status: DC
  Administered 2018-02-21: 81 mg via ORAL
  Filled 2018-02-20: qty 1

## 2018-02-20 MED ORDER — IOPAMIDOL (ISOVUE-370) INJECTION 76%
100.0000 mL | Freq: Once | INTRAVENOUS | Status: AC | PRN
  Administered 2018-02-20: 100 mL via INTRAVENOUS

## 2018-02-20 MED ORDER — NITROGLYCERIN 0.4 MG SL SUBL
0.4000 mg | SUBLINGUAL_TABLET | SUBLINGUAL | Status: DC | PRN
  Administered 2018-02-20 (×3): 0.4 mg via SUBLINGUAL
  Filled 2018-02-20: qty 1

## 2018-02-20 MED ORDER — ONDANSETRON HCL 4 MG/2ML IJ SOLN
4.0000 mg | Freq: Once | INTRAMUSCULAR | Status: AC
  Administered 2018-02-20: 4 mg via INTRAVENOUS
  Filled 2018-02-20: qty 2

## 2018-02-20 MED ORDER — GUAIFENESIN-DM 100-10 MG/5ML PO SYRP
15.0000 mL | ORAL_SOLUTION | ORAL | Status: DC | PRN
  Administered 2018-02-20: 15 mL via ORAL
  Filled 2018-02-20 (×2): qty 15

## 2018-02-20 MED ORDER — SODIUM CHLORIDE 0.9 % IV BOLUS
1000.0000 mL | Freq: Once | INTRAVENOUS | Status: AC
  Administered 2018-02-20: 1000 mL via INTRAVENOUS

## 2018-02-20 MED ORDER — GABAPENTIN 300 MG PO CAPS
300.0000 mg | ORAL_CAPSULE | Freq: Two times a day (BID) | ORAL | Status: DC
  Administered 2018-02-20 – 2018-02-21 (×2): 300 mg via ORAL
  Filled 2018-02-20 (×2): qty 1

## 2018-02-20 MED ORDER — ONDANSETRON HCL 4 MG/2ML IJ SOLN: 4.0000 mg | Freq: Four times a day (QID) | INTRAMUSCULAR | Status: DC | PRN

## 2018-02-20 MED ORDER — ACETAMINOPHEN 325 MG PO TABS
650.0000 mg | ORAL_TABLET | ORAL | Status: DC | PRN
  Filled 2018-02-20: qty 2

## 2018-02-20 MED ORDER — IOPAMIDOL (ISOVUE-370) INJECTION 76%
INTRAVENOUS | Status: AC
  Filled 2018-02-20: qty 100

## 2018-02-20 MED ORDER — LEVOTHYROXINE SODIUM 125 MCG PO TABS
125.0000 ug | ORAL_TABLET | Freq: Every day | ORAL | Status: DC
  Administered 2018-02-21: 125 ug via ORAL
  Filled 2018-02-20: qty 1

## 2018-02-20 MED ORDER — TRAMADOL HCL 50 MG PO TABS
50.0000 mg | ORAL_TABLET | Freq: Four times a day (QID) | ORAL | Status: DC | PRN
  Administered 2018-02-20 – 2018-02-21 (×2): 50 mg via ORAL
  Administered 2018-02-21: 100 mg via ORAL
  Filled 2018-02-20: qty 1
  Filled 2018-02-20: qty 2
  Filled 2018-02-20: qty 1

## 2018-02-20 MED ORDER — PERFLUTREN LIPID MICROSPHERE
1.0000 mL | INTRAVENOUS | Status: AC | PRN
  Administered 2018-02-20: 3 mL via INTRAVENOUS
  Filled 2018-02-20: qty 10

## 2018-02-20 MED ORDER — METOPROLOL TARTRATE 25 MG PO TABS
25.0000 mg | ORAL_TABLET | Freq: Two times a day (BID) | ORAL | Status: DC
  Administered 2018-02-20 – 2018-02-21 (×2): 25 mg via ORAL
  Filled 2018-02-20 (×2): qty 1

## 2018-02-20 MED ORDER — ENOXAPARIN SODIUM 40 MG/0.4ML ~~LOC~~ SOLN: 40.0000 mg | SUBCUTANEOUS | Status: DC

## 2018-02-20 MED ORDER — POLYETHYLENE GLYCOL 3350 17 G PO PACK: 17.0000 g | PACK | Freq: Every day | ORAL | Status: DC | PRN

## 2018-02-20 NOTE — ED Notes (Signed)
Echo at bedside

## 2018-02-20 NOTE — ED Triage Notes (Signed)
Pt from [facility]m with c/o CP centralized and radiating to LA and breast; also radiating to L neck; Pt with c/o L groin

## 2018-02-20 NOTE — ED Notes (Signed)
Pt c/o nausea while in bathroom.  BP 96/70. MD at bedside.  Given fluids and zofran.

## 2018-02-20 NOTE — Progress Notes (Signed)
  Echocardiogram 2D Echocardiogram has been performed.  Caitlin Ho 02/20/2018, 4:41 PM

## 2018-02-20 NOTE — ED Provider Notes (Signed)
[facility] EMERGENCY DEPARTMENT Provider Note   CSN: [id number] Arrival date & time: 02/20/18  0258     History   Chief Complaint Chief Complaint  Patient presents with  . Chest Pain    HPI Caitlin Ho is a 62 y.o. female.  HPI   Neck pain began Wednesday and shoulder pain, feels like pressure, is worse with movements, turning neck, hx of neck fusion  Today this AM began to have chest pain, chest pain feels like a pressure, indigestion, chest pain starts in center and radiates to left breast, radiates to the shoulder.  Began around 6AM. Chest pain worse with turning, deep breaths. Also has nausea, pain down the neck and shoulder. Groin pain, leg pain on inside of left leg began on Wednesday.  Worse with movements. Mild left lip back pain, hx of lumbar surgeries.  No shortness of breath, no vomiting, occ diaphoresis, no lightheadedness Does have nausea  Borderline DM, htn, hx of cva silent No hx of prior heart work up or disease No hx of DVT/PE  Past Medical History:  Diagnosis Date  . Cancer ([place])    cervical cancer cells whwn patient was 22   . Diabetes mellitus without complication (HCC)    borderline  . GERD (gastroesophageal reflux disease)    not on medication  . Headache(784.0)    sinus  . Homonymous hemianopia   . Hypertension   . Hypothyroid   . PONV (postoperative nausea and vomiting)    06/20/14 patient reported that BP dropped real low and she had N/V  and was slow to awaken when she had surgery at an outpatient center.  . Seasonal allergies   . Stroke [facility])     Patient Active Problem List   Diagnosis Date Noted  . Chronic lumbar radiculopathy 07/19/2013  . Lumbar stenosis 02/16/2013    Past Surgical History:  Procedure Laterality Date  . ABDOMINAL HYSTERECTOMY    . APPENDECTOMY  1978  . BACK SURGERY  02/16/13   3rd since May  3 fusions  . c5-c6 fusion    . LUMBAR DISC SURGERY  10/03/2012   Dr Ellene Route  2 Discectomy  .  THYROIDECTOMY  2006  . THYROIDECTOMY  2006     OB History   No obstetric history on file.      Home Medications    Prior to Admission medications   Medication Sig Start Date End Date Taking? Authorizing Provider  aspirin EC 81 MG tablet Take 81 mg by mouth daily.   Yes [provider]  furosemide (LASIX) 40 MG tablet Take 40 mg by mouth daily.    Yes [provider]  gabapentin (NEURONTIN) 300 MG capsule Take 1 capsule (300 mg total) by mouth 3 (three) times daily. Patient taking differently: Take 300 mg by mouth 2 (two) times daily.  07/23/13  Yes Kristeen Miss, MD  levothyroxine (SYNTHROID, LEVOTHROID) 125 MCG tablet Take 125 mcg by mouth daily. 11/29/17  Yes [provider]  methocarbamol (ROBAXIN) 500 MG tablet Take 1 tablet (500 mg total) by mouth every 6 (six) hours as needed for muscle spasms. 02/19/13  Yes Kristeen Miss, MD  metoprolol tartrate (LOPRESSOR) 25 MG tablet Take 25 mg by mouth 2 (two) times daily. 01/20/18  Yes [provider]  polyethylene glycol (MIRALAX / GLYCOLAX) packet Take 17 g by mouth daily as needed for mild constipation.    Yes [provider]  traMADol (ULTRAM) 50 MG tablet Take 50-100 mg  by mouth every 6 (six) hours as needed for moderate pain.  01/03/18  Yes [provider]    Family History History reviewed. No pertinent family history.  Social History Social History   Tobacco Use  . Smoking status: Never Smoker  . Smokeless tobacco: Never Used  Substance Use Topics  . Alcohol use: No  . Drug use: No     Allergies   Patient has no known allergies.   Review of Systems Review of Systems  Constitutional: Positive for fatigue. Negative for fever.  HENT: Negative for sore throat.   Eyes: Negative for visual disturbance.  Respiratory: Negative for cough and shortness of breath.   Cardiovascular: Positive for chest pain.  Gastrointestinal: Positive for nausea. Negative for abdominal pain  and vomiting.  Genitourinary: Negative for difficulty urinating.  Musculoskeletal: Positive for arthralgias, back pain and neck pain.  Skin: Negative for rash.  Neurological: Negative for syncope and headaches.     Physical Exam Updated Vital Signs LMP  (LMP Unknown)   Physical Exam Vitals signs and nursing note reviewed.  Constitutional:      General: She is not in acute distress.    Appearance: She is well-developed. She is not diaphoretic.  HENT:     Head: Normocephalic and atraumatic.  Eyes:     Conjunctiva/sclera: Conjunctivae normal.  Neck:     Musculoskeletal: Normal range of motion.  Cardiovascular:     Rate and Rhythm: Normal rate and regular rhythm.     Heart sounds: Normal heart sounds. No murmur. No friction rub. No gallop.   Pulmonary:     Effort: Pulmonary effort is normal. No respiratory distress.     Breath sounds: Normal breath sounds. No wheezing or rales.  Abdominal:     General: There is no distension.     Palpations: Abdomen is soft.     Tenderness: There is no abdominal tenderness. There is no guarding.  Musculoskeletal:        General: No tenderness.     Comments: Tenderness neck bilaterally  Skin:    General: Skin is warm and dry.     Findings: No erythema or rash.  Neurological:     Mental Status: She is alert and oriented to person, place, and time.      ED Treatments / Results  Labs (all labs ordered are listed, but only abnormal results are displayed) Labs Reviewed  CBC WITH DIFFERENTIAL/PLATELET  COMPREHENSIVE METABOLIC PANEL  I-STAT TROPONIN, ED    EKG EKG Interpretation  Date/Time:  Friday February 20 2018 10:04:41 EST Ventricular Rate:  92 PR Interval:    QRS Duration: 83 QT Interval:  357 QTC Calculation: 442 R Axis:   68 Text Interpretation:  Sinus rhythm Atrial premature complex Borderline repolarization abnormality Baseline wander in lead(s) II aVF No significant change since last tracing , similar borderline  repolarization abnormality in the past Confirmed by Gareth Morgan [phone]) on 02/20/2018 10:15:13 AM   Radiology No results found.  Procedures Procedures (including critical care time)  Medications Ordered in ED Medications  nitroGLYCERIN (NITROSTAT) SL tablet 0.4 mg (has no administration in time range)     Initial Impression / Assessment and Plan / ED Course  I have reviewed the triage vital signs and the nursing notes.  Pertinent labs & imaging results that were available during my care of the patient were reviewed by me and considered in my medical decision making (see chart for details).     62 year old female with a history  of diabetes, hypertension, CVA, presents with concern for chest pain.  Differential diagnosis includes ACS, poliomyelitis, aortic dissection, pneumothorax, pneumonia.  EKG was read by me and showed no evidence of pericarditis, or acute changes.  No dyspnea, doubt PE. Given patient describes some paresthesias left groin and left finger tips, with pain going to the neck, back, as well as left groin pain, ordered CT angios dissection study to evaluate.  CT angio shows no evidence of dissection or other acute abnormalities.  Troponin negative.  While she does have symptoms that may be consistent with muscular pain, she also describes some typical cardiac symptoms such as chest pressure with radiation, nausea, diaphoresis, and given risk factors, elevated HEART score, recommend admission for further evaluation. Chest pain improved with nitroglycerin in the ED.    Final Clinical Impressions(s) / ED Diagnoses   Final diagnoses:  Chest pain, unspecified type    ED Discharge Orders    None       Gareth Morgan, MD 02/20/18 [phone]

## 2018-02-20 NOTE — H&P (Signed)
History and Physical    Caitlin Ho DOB: 03/05/1955 DOA: 02/20/2018  PCP: Rochel Brome, MD   Patient coming from: home  Chief Complaint: Chest pain  HPI: Caitlin Ho is a 62 y.o. female with medical history significant for obesity, hypertension, stroke, prediabetes, family history of MI in father in his 39s, history of multiple back surgery, cervical fusion presented to the ER with complaint of chest pain this morning.  Patient reports that since Wednesday patient has been having left neck and left shoulder pain.  Patient reports since this morning she is having chest pain on the left sternal border area, described as pressure sensation without any radiation with associated nausea, diaphoresis shortness of breath.  Her pain was somewhat improved with nitroglycerin. She denies any fever, vomiting, abdominal pain, focal weakness, melena/hematochezia.  ED Course: Vitals stable: BP 116/72,, 90%  RA, placed on 2L [place].  Chest x-ray showed mild cardiomegaly, given her symptoms underwent CT angio chest abdomen pelvis for dissection studies and was unremarkable.  After subsequent nitroglycerin S/l, patient blood pressure became somewhat soft in 90s and became dizzy/diaphoretic and getting iv nss. EKG: Personally reviewed, no overt signs of ischemia noted.CXR with cardiomegaly.  Review of Systems: All systems are reviewed and were negative except as mentioned in HPI above  Past Medical History:  Diagnosis Date  . Cancer ([place])    cervical cancer cells whwn patient was 22   . Diabetes mellitus without complication (HCC)    borderline  . GERD (gastroesophageal reflux disease)    not on medication  . Headache(784.0)    sinus  . Homonymous hemianopia   . Hypertension   . Hypothyroid   . PONV (postoperative nausea and vomiting)    06/20/14 patient reported that BP dropped real low and she had N/V  and was slow to awaken when she had surgery at an outpatient center.  . Seasonal  allergies   . Stroke [facility])     Past Surgical History:  Procedure Laterality Date  . ABDOMINAL HYSTERECTOMY    . APPENDECTOMY  1978  . BACK SURGERY  02/16/13   3rd since May  3 fusions  . c5-c6 fusion    . LUMBAR DISC SURGERY  10/03/2012   Dr Ellene Route  2 Discectomy  . THYROIDECTOMY  2006  . THYROIDECTOMY  2006     reports that she has never smoked. She has never used smokeless tobacco. She reports that she does not drink alcohol or use drugs.  No Known Allergies  History reviewed. No pertinent family history.   Prior to Admission medications   Medication Sig Start Date End Date Taking? Authorizing Provider  aspirin EC 81 MG tablet Take 81 mg by mouth daily.   Yes [provider]  furosemide (LASIX) 40 MG tablet Take 40 mg by mouth daily.    Yes [provider]  gabapentin (NEURONTIN) 300 MG capsule Take 1 capsule (300 mg total) by mouth 3 (three) times daily. Patient taking differently: Take 300 mg by mouth 2 (two) times daily.  07/23/13  Yes Kristeen Miss, MD  levothyroxine (SYNTHROID, LEVOTHROID) 125 MCG tablet Take 125 mcg by mouth daily. 11/29/17  Yes [provider]  methocarbamol (ROBAXIN) 500 MG tablet Take 1 tablet (500 mg total) by mouth every 6 (six) hours as needed for muscle spasms. 02/19/13  Yes Kristeen Miss, MD  metoprolol tartrate (LOPRESSOR) 25 MG tablet Take 25 mg by mouth 2 (two) times daily. 01/20/18  Yes [provider]  polyethylene glycol (MIRALAX / GLYCOLAX) packet Take 17 g by mouth daily as needed for mild constipation.    Yes [provider]  traMADol (ULTRAM) 50 MG tablet Take 50-100 mg by mouth every 6 (six) hours as needed for moderate pain.  01/03/18  Yes [provider]    Physical Exam: Vitals:   02/20/18 1130 02/20/18 1200 02/20/18 1230 02/20/18 1300  BP: 116/72 131/85 120/68 124/79  Pulse: 94 89 84 88  Resp: 19 17 19 17   SpO2: 90% 94% 99% 98%    Constitutional: NAD, calm,  comfortable Vitals:   02/20/18 1130 02/20/18 1200 02/20/18 1230 02/20/18 1300  BP: 116/72 131/85 120/68 124/79  Pulse: 94 89 84 88  Resp: 19 17 19 17   SpO2: 90% 94% 99% 98%   General: Obese, mildly dizzy and anxious Eyes: PERRL, lids and conjunctivae normal ENMT: Mucous membranes are moist. Posterior pharynx clear of any exudate or lesions.Normal dentition.  Neck: normal, supple, no masses, no thyromegaly Respiratory: Bilateral clear to auscultation no wheezing or crackles.Normal respiratory effort. No accessory muscle use.  Cardiovascular: Regular rate and rhythm no murmurs or gallops.  No ankle edema.   Abdomen: no tenderness, no masses palpated. No hepatosplenomegaly. Bowel sounds positive.  Musculoskeletal: no clubbing / cyanosis. No joint deformity upper and lower extremities. Good ROM, no contractures. Normal muscle tone.  Skin: no rashes, lesions, ulcers. No induration Neurologic: CN 2-12 grossly intact. Sensation intact, DTR normal. Strength 5/5 in all 4.  Psychiatric: Normal judgment and insight. Alert and oriented x 3. Normal mood.   Labs on Admission: I have personally reviewed following labs and imaging studies  CBC: Recent Labs  Lab 02/20/18 1024  WBC 10.4  NEUTROABS 6.2  HGB 14.5  HCT 44.9  MCV 91.8  PLT 412   Basic Metabolic Panel: Recent Labs  Lab 02/20/18 1024  NA 142  K 3.9  CL 106  CO2 25  GLUCOSE 170*  BUN 16  CREATININE 0.97  CALCIUM 9.4   GFR: Estimated Creatinine Clearance: 74.2 mL/min (by C-G formula based on SCr of 0.97 mg/dL). Liver Function Tests: Recent Labs  Lab 02/20/18 1024  AST 18  ALT 14  ALKPHOS 57  BILITOT 0.5  PROT 6.7  ALBUMIN 3.5   No results for input(s): LIPASE, AMYLASE in the last 168 hours. No results for input(s): AMMONIA in the last 168 hours. Coagulation Profile: No results for input(s): INR, PROTIME in the last 168 hours. Cardiac Enzymes: No results for input(s): CKTOTAL, CKMB, CKMBINDEX, TROPONINI in the  last 168 hours. BNP (last 3 results) No results for input(s): PROBNP in the last 8760 hours. HbA1C: No results for input(s): HGBA1C in the last 72 hours. CBG: No results for input(s): GLUCAP in the last 168 hours. Lipid Profile: No results for input(s): CHOL, HDL, LDLCALC, TRIG, CHOLHDL, LDLDIRECT in the last 72 hours. Thyroid Function Tests: No results for input(s): TSH, T4TOTAL, FREET4, T3FREE, THYROIDAB in the last 72 hours. Anemia Panel: No results for input(s): VITAMINB12, FOLATE, FERRITIN, TIBC, IRON, RETICCTPCT in the last 72 hours. Urine analysis: No results found for: COLORURINE, APPEARANCEUR, [place], [place], [place], [place], [place], [place], [place], [place], [place], [place]  Radiological Exams on Admission: Dg Chest 2 View  Result Date: 02/20/2018 CLINICAL DATA:  Chest pain EXAM: CHEST - 2 VIEW COMPARISON:  February 11, 2013 FINDINGS: No edema or consolidation. Heart is slightly enlarged with pulmonary vascularity normal. No adenopathy. There is postoperative change in the lower cervical region. There is an azygos lobe  on the right, an anatomic variant. IMPRESSION: Mild cardiomegaly.  No edema or consolidation. Electronically Signed   By: Lowella Grip III M.D.   On: 02/20/2018 11:04   Ct Angio Chest/abd/pel For Dissection W And/or Wo Contrast  Result Date: 02/20/2018 CLINICAL DATA:  Central chest pain radiating into the neck and left breast and groin. Question dissection. EXAM: CT ANGIOGRAPHY CHEST, ABDOMEN AND PELVIS TECHNIQUE: Multidetector CT imaging through the chest, abdomen and pelvis was performed using the standard protocol during bolus administration of intravenous contrast. Multiplanar reconstructed images and MIPs were obtained and reviewed to evaluate the vascular anatomy. CONTRAST:  126mL ISOVUE-370 IOPAMIDOL (ISOVUE-370) INJECTION 76% COMPARISON:  Radiographs same date. Chest CT 01/03/2006. Abdominal CT 06/09/2014. FINDINGS: CTA CHEST  FINDINGS Cardiovascular: Pre contrast images demonstrate minimal aortic and coronary artery atherosclerosis. There are no displaced intimal calcifications or mediastinal hematoma. Following contrast, the aorta and great vessels enhance normally. No evidence of aneurysm, dissection or large vessel occlusion. There is satisfactory opacification of the pulmonary arteries, and no evidence of acute pulmonary embolism. The heart size is normal. There is no pericardial effusion. Mediastinum/Nodes: There are no enlarged mediastinal, hilar or axillary lymph nodes. The thyroid gland, trachea and esophagus demonstrate no significant findings. Lungs/Pleura: No pleural effusion or pneumothorax. There is mild emphysema and subsegmental atelectasis. Azygos fissure noted. Musculoskeletal/Chest wall: There is no chest wall mass or suspicious osseous finding. Review of the MIP images confirms the above findings. CTA ABDOMEN AND PELVIS FINDINGS VASCULAR Aorta: Minimal atherosclerosis. No aneurysm, dissection or stenosis. Celiac: Normal.  Conventional anatomy. SMA: Normal. Renals: Normal. IMA: Normal Inflow: Normal Veins: Limited opacification without apparent abnormality. Review of the MIP images confirms the above findings. NON-VASCULAR Hepatobiliary: No focal hepatic abnormality or abnormal enhancement. No evidence of gallstones, gallbladder wall thickening or surrounding inflammation. Pancreas: Unremarkable. No evidence of pancreatic mass, ductal dilatation or surrounding inflammation. Spleen: Normal in size without focal abnormality. Adrenals/Urinary Tract: There is a left adrenal nodule measuring 2.8 x 2.7 cm on image 58/12. This has a nonspecific density of 25 HU and enhances in a nonspecific fashion following contrast. This nodule is not visible on the remote chest CT, but may not have been imaged at that time. It is visible on a lumbar spine CT done 06/30/2014 and abdominal CT 06/09/2014, and has not grossly changed since  then, consistent with an adenoma. The right adrenal gland appears normal. The kidneys, ureters and bladder appear normal. Stomach/Bowel: No bowel wall thickening, distention or surrounding inflammation. Lymphatic: No enlarged abdominopelvic lymph nodes. Reproductive: Hysterectomy.  No adnexal mass. Other: Intact anterior abdominal wall.  No ascites or free air. Musculoskeletal: No acute osseous findings. Previous L1 through L5 lumbar fusion. Review of the MIP images confirms the above findings. IMPRESSION: 1. No acute vascular findings in the chest, abdomen or pelvis. Minimal aortic atherosclerosis. 2. Stable left adrenal adenoma. Electronically Signed   By: Richardean Sale M.D.   On: 02/20/2018 12:56    Assessment/Plan  Chest pain, with both typical and atypical features.  EKG with no overt signs of ischemia.  CT Angio chest abdomen pelvis without acute vascular finding.Given her high risk factors including obesity, recent silent stroke, hypertension, prediabetes, strong family history with HART score of 4, will admit her , obtain serial troponin  And rule out ACS.Will obtain echocardiogram.May need to schedule for outpatient stress test if not able to be done tomorrow. Continue aspirin, metoprolol, PRN nitroglycerin.  Recent lipid panel 12/16 with LDL 105 TG 303, HDL 41.  Hypertension, blood pressure fairly stable, continue her metoprolol.  On Lasix for chronic leg edema and will hold for today.  Hypothyroidism continue Synthroid.  Prediabetes, last A1c was 5.9.  History of multiple back surgery, continue her pain regimen, muscle relaxants.  History of recent silent stroke, she is being followed by neurology.  Continue her aspirin  Severity of Illness: The appropriate patient status for this patient is OBSERVATION. Observation status is judged to be reasonable and necessary in order to provide the required intensity of service to ensure the patient's safety. The patient's presenting symptoms,  physical exam findings, and initial radiographic and laboratory data in the context of their medical condition is felt to place them at decreased risk for further clinical deterioration. Furthermore, it is anticipated that the patient will be medically stable for discharge from the hospital within 2 midnights of admission.   DVT prophylaxis: Lovenox Code Status: Full code Family Communication: Husband at the bedside. Consults called: None.  Antonieta Pert MD [facility] Pager [phone]  If 7PM-7AM, please contact night-coverage [email or web address] Password [email or web address]  02/20/2018, 1:42 PM

## 2018-02-20 NOTE — ED Notes (Signed)
Pt placed on 2L [place] for O2 sats of 89%.  Increased sats to 92%.  Provider notfied.

## 2018-02-21 ENCOUNTER — Encounter (HOSPITAL_COMMUNITY): Payer: Self-pay | Admitting: Cardiology

## 2018-02-21 ENCOUNTER — Observation Stay (HOSPITAL_BASED_OUTPATIENT_CLINIC_OR_DEPARTMENT_OTHER): Payer: PPO

## 2018-02-21 DIAGNOSIS — R079 Chest pain, unspecified: Secondary | ICD-10-CM | POA: Diagnosis not present

## 2018-02-21 LAB — NM MYOCAR MULTI W/SPECT W/WALL MOTION / EF
CHL CUP MPHR: 160 {beats}/min
Estimated workload: 1 METS
Exercise duration (min): 0 min
Exercise duration (sec): 0 s
Peak HR: 93 {beats}/min
Percent HR: 58 %
RPE: 0
Rest HR: 73 {beats}/min

## 2018-02-21 LAB — GLUCOSE, CAPILLARY: Glucose-Capillary: 102 mg/dL — ABNORMAL HIGH (ref 70–99)

## 2018-02-21 MED ORDER — ATORVASTATIN CALCIUM 40 MG PO TABS: 40.0000 mg | ORAL_TABLET | Freq: Every day | ORAL | 0 refills | Status: DC

## 2018-02-21 MED ORDER — AMINOPHYLLINE 25 MG/ML IV SOLN
INTRAVENOUS | Status: AC
  Filled 2018-02-21: qty 10

## 2018-02-21 MED ORDER — REGADENOSON 0.4 MG/5ML IV SOLN
INTRAVENOUS | Status: AC
  Filled 2018-02-21: qty 5

## 2018-02-21 MED ORDER — ATORVASTATIN CALCIUM 40 MG PO TABS: 40.0000 mg | ORAL_TABLET | Freq: Every day | ORAL | Status: DC

## 2018-02-21 MED ORDER — ALBUTEROL SULFATE HFA 108 (90 BASE) MCG/ACT IN AERS: 2.0000 | INHALATION_SPRAY | Freq: Four times a day (QID) | RESPIRATORY_TRACT | 2 refills | Status: DC | PRN

## 2018-02-21 MED ORDER — LEVOTHYROXINE SODIUM 25 MCG PO TABS
125.0000 ug | ORAL_TABLET | Freq: Every day | ORAL | Status: DC
  Filled 2018-02-21: qty 1

## 2018-02-21 MED ORDER — TECHNETIUM TC 99M TETROFOSMIN IV KIT
30.0000 | PACK | Freq: Once | INTRAVENOUS | Status: AC | PRN
  Administered 2018-02-21: 30 via INTRAVENOUS

## 2018-02-21 MED ORDER — TECHNETIUM TC 99M TETROFOSMIN IV KIT
10.0000 | PACK | Freq: Once | INTRAVENOUS | Status: AC | PRN
  Administered 2018-02-21: 10 via INTRAVENOUS

## 2018-02-21 MED ORDER — AMINOPHYLLINE 25 MG/ML IV (NUC MED)
75.0000 mg | Freq: Once | INTRAVENOUS | Status: AC
  Administered 2018-02-21: 75 mg via INTRAVENOUS

## 2018-02-21 MED ORDER — PRAVASTATIN SODIUM 20 MG PO TABS: 20.0000 mg | ORAL_TABLET | Freq: Every day | ORAL | 0 refills | Status: DC

## 2018-02-21 MED ORDER — REGADENOSON 0.4 MG/5ML IV SOLN
0.4000 mg | Freq: Once | INTRAVENOUS | Status: AC
  Administered 2018-02-21: 0.4 mg via INTRAVENOUS
  Filled 2018-02-21: qty 5

## 2018-02-21 MED ORDER — GABAPENTIN 300 MG PO CAPS: 300.0000 mg | ORAL_CAPSULE | Freq: Two times a day (BID) | ORAL | Status: DC

## 2018-02-21 NOTE — Progress Notes (Signed)
Nuclear stress without clear ischemia. No further cardiac testing planned at this time, ok for discharge from our standpoint   Carlyle Dolly MD

## 2018-02-21 NOTE — Consult Note (Addendum)
Cardiology Consultation:   Patient ID: Caitlin Ho MRN: [id number]; DOB: January 07, 1956  Admit date: 02/20/2018 Date of Consult: 02/21/2018  Primary Care Provider: Rochel Brome, MD Primary Cardiologist: No primary care provider on file. NEW Primary Electrophysiologist:  None    Patient Profile:   Caitlin Ho is a 62 y.o. female with a hx of palpitations, HTN, CVA, pre-diabetes, and premature CAD in father,  who is being seen today for the evaluation of chest pain at the request of Caitlin Ho.  History of Present Illness:   Caitlin Ho with hx of palpitations evaluated with Holter monitor 2018 with rare PVCs, occ PAC with 2 runs of tachycardia.  One 96 beats long.  Atrial tach.  No symptoms reported.  Other hx of premature FH of CAD with father with MI in his 21s, she has HTN, obesity, prediabetes, and hx of  Back surgeries.    Pt presented to ER with Lt sternal border chest pain -a pressure sensation.-with nausea diaphoresis.  No real SOB.   No radiation.  Her pain did resolve with total of 3 NTG.  No recent colds or fevers.  On Wed and Thursday pain in Lt neck and shoulder which she attributed to back issues.  But Friday was in chest a pressure she thought was indigestion and she did have nausea. She came to ER.  Last night she started with neck and shoulder pain and oxygen was applied and symptoms resolved.  No complaints currently.   She had loss of bilateral lt peripheral vision in August 2019 (left homonymous hemianopsia) on routine exam and further workup with recent MRI a rt occipital infarct was found.     Dr. Leonie Ho had ordered echo and sleep study.    EKG I personally reviewed SR with mild ST depression lat leads though not significantly changed from 2016 and + PACs.  Marland Kitchen      troponin 0.00 to <0.03.   Na 142, K+ 3.9, glucose 170, Cr 0.97  T chol 207, HDL 41 and LDL 104, TG 303  hgb 14.5 WBC 10.4 Plts 322  Hgb A1C 5.9   CTA of chest  No acute vascular findings in the chest,  abdomen or pelvis. Minimal aortic atherosclerosis. 2. Stable left adrenal adenoma.  CXR  Mild cardiomegaly.  No edema or consolidation.  Echo  Study Conclusions  - Left ventricle: The cavity size was normal. Wall thickness was   normal. Systolic function was normal. The estimated ejection   fraction was in the range of 55% to 60%. Wall motion was normal;   there were no regional wall motion abnormalities. Doppler   parameters are consistent with abnormal left ventricular   relaxation (grade 1 diastolic dysfunction).  Impressions:  - Definity used; normal LV function; mild diastolic dysfunction.  Telemetry:  Telemetry was personally reviewed and demonstrates:  SR with PACs    Past Medical History:  Diagnosis Date  . Cancer ([place])    cervical cancer cells whwn patient was 22   . Diabetes mellitus without complication (H[facility])    borderline  . GERD (gastroesophageal reflux disease)    not on medication  . Headache(784.0)    sinus  . Homonymous hemianopia   . Hypertension   . Hypothyroid   . PONV (postoperative nausea and vomiting)    06/20/14 patient reported that BP dropped real low and she had N/V  and was slow to awaken when she had surgery at an outpatient center.  . Seasonal allergies   .  Stroke [facility])     Past Surgical History:  Procedure Laterality Date  . ABDOMINAL HYSTERECTOMY    . APPENDECTOMY  1978  . BACK SURGERY  02/16/13   3rd since May  3 fusions  . c5-c6 fusion    . LUMBAR DISC SURGERY  10/03/2012   Dr Caitlin Ho  2 Discectomy  . THYROIDECTOMY  2006  . THYROIDECTOMY  2006     Home Medications:  Prior to Admission medications   Medication Sig Start Date End Date Taking? Authorizing Provider  aspirin EC 81 MG tablet Take 81 mg by mouth daily.   Yes [provider]  furosemide (LASIX) 40 MG tablet Take 40 mg by mouth daily.    Yes [provider]  gabapentin (NEURONTIN) 300 MG capsule Take 1 capsule (300 mg total) by mouth 3 (three)  times daily. Patient taking differently: Take 300 mg by mouth 2 (two) times daily.  07/23/13  Yes Caitlin Miss, MD  levothyroxine (SYNTHROID, LEVOTHROID) 125 MCG tablet Take 125 mcg by mouth daily. 11/29/17  Yes [provider]  methocarbamol (ROBAXIN) 500 MG tablet Take 1 tablet (500 mg total) by mouth every 6 (six) hours as needed for muscle spasms. 02/19/13  Yes Caitlin Miss, MD  metoprolol tartrate (LOPRESSOR) 25 MG tablet Take 25 mg by mouth 2 (two) times daily. 01/20/18  Yes [provider]  polyethylene glycol (MIRALAX / GLYCOLAX) packet Take 17 g by mouth daily as needed for mild constipation.    Yes [provider]  traMADol (ULTRAM) 50 MG tablet Take 50-100 mg by mouth every 6 (six) hours as needed for moderate pain.  01/03/18  Yes [provider]    Inpatient Medications: Scheduled Meds: . aspirin EC  81 mg Oral Daily  . enoxaparin (LOVENOX) injection  40 mg Subcutaneous Q24H  . gabapentin  300 mg Oral BID  . levothyroxine  125 mcg Oral Q0600  . metoprolol tartrate  25 mg Oral BID   Continuous Infusions:  PRN Meds: acetaminophen, guaiFENesin-dextromethorphan, methocarbamol, nitroGLYCERIN, ondansetron (ZOFRAN) IV, polyethylene glycol, traMADol  Allergies:   No Known Allergies  Social History:   Social History   Socioeconomic History  . Marital status: Married    Spouse name: Not on file  . Number of children: Not on file  . Years of education: Not on file  . Highest education level: Not on file  Occupational History  . Not on file  Social Needs  . Financial resource strain: Not on file  . Food insecurity:    Worry: Not on file    Inability: Not on file  . Transportation needs:    Medical: Not on file    Non-medical: Not on file  Tobacco Use  . Smoking status: Never Smoker  . Smokeless tobacco: Never Used  Substance and Sexual Activity  . Alcohol use: No  . Drug use: No  . Sexual activity: Not Currently    Birth  control/protection: Abstinence  Lifestyle  . Physical activity:    Days per week: Not on file    Minutes per session: Not on file  . Stress: Not on file  Relationships  . Social connections:    Talks on phone: Not on file    Gets together: Not on file    Attends religious service: Not on file    Active member of club or organization: Not on file    Attends meetings of clubs or organizations: Not on file    Relationship status: Not on  file  . Intimate partner violence:    Fear of current or ex partner: Not on file    Emotionally abused: Not on file    Physically abused: Not on file    Forced sexual activity: Not on file  Other Topics Concern  . Not on file  Social History Narrative  . Not on file    Family History:   Family History  Problem Relation Age of Onset  . Heart attack Father 60     ROS:  Please see the history of present illness.  General:no colds or fevers, no weight changes Skin:no rashes or ulcers HEENT:no blurred vision, no congestion see HPI CV:see HPI PUL:see HPI GI:no diarrhea constipation or melena, no indigestion GU:no hematuria, no dysuria MS:no joint pain, no claudication, + chronic back pain with multiple surgeries Neuro:no syncope, no lightheadedness Endo:borderline diabetes, + thyroid disease  All other ROS reviewed and negative.     Physical Exam/Data:   Vitals:   02/20/18 1655 02/21/18 0028 02/21/18 0355 02/21/18 0930  BP: 128/82 127/66 115/69 117/69  Pulse: 93 78 87 77  Resp: 18     Temp: 98 F (36.7 C) 98.2 F (36.8 C) 98.3 F (36.8 C)   TempSrc: Oral Oral Oral   SpO2:  (!) 88% (!) 86%   Weight: 102.1 kg     Height: 5\' 5"  (1.651 m)       Intake/Output Summary (Last 24 hours) at 02/21/2018 1006 Last data filed at 02/20/2018 1700 Gross per 24 hour  Intake 1014.63 ml  Output -  Net 1014.63 ml   Filed Weights   02/20/18 1655  Weight: 102.1 kg   Body mass index is 37.44 kg/m.  General:  Well nourished, well developed, in  no acute distress, feeling well this AM HEENT: normal Lymph: no adenopathy Neck: no JVD Endocrine:  No thryomegaly Vascular: No carotid bruits; pedal pulses 2+ bilaterally  Cardiac:  normal S1, S2; RRR; no murmur gallup rub or click Lungs:  clear to auscultation bilaterally, no wheezing, rhonchi or rales  Abd: soft, nontender, no hepatomegaly  Ext: no edema Musculoskeletal:  No deformities, BUE and BLE strength normal and equal Skin: warm and dry  Neuro:  Alert and oriented X 3 MAE follows commands , no focal abnormalities noted Psych:  Normal affect    Relevant CV Studies: Echo 02/20/18  Study Conclusions  - Left ventricle: The cavity size was normal. Wall thickness was   normal. Systolic function was normal. The estimated ejection   fraction was in the range of 55% to 60%. Wall motion was normal;   there were no regional wall motion abnormalities. Doppler   parameters are consistent with abnormal left ventricular   relaxation (grade 1 diastolic dysfunction).  Impressions:  - Definity used; normal LV function; mild diastolic dysfunction.  Laboratory Data:  Chemistry Recent Labs  Lab 02/20/18 1024  NA 142  K 3.9  CL 106  CO2 25  GLUCOSE 170*  BUN 16  CREATININE 0.97  CALCIUM 9.4  GFRNONAA >60  GFRAA >60  ANIONGAP 11    Recent Labs  Lab 02/20/18 1024  PROT 6.7  ALBUMIN 3.5  AST 18  ALT 14  ALKPHOS 57  BILITOT 0.5   Hematology Recent Labs  Lab 02/20/18 1024  WBC 10.4  RBC 4.89  HGB 14.5  HCT 44.9  MCV 91.8  MCH 29.7  MCHC 32.3  RDW 12.4  PLT 322   Cardiac Enzymes Recent Labs  Lab 02/20/18 1657 02/20/18  [place] <0.03 <0.03    Recent Labs  Lab 02/20/18 1034 02/20/18 1301  TROPIPOC 0.00 0.00    BNPNo results for input(s): BNP, PROBNP in the last 168 hours.  DDimer No results for input(s): DDIMER in the last 168 hours.  Radiology/Studies:  Dg Chest 2 View  Result Date: 02/20/2018 CLINICAL DATA:  Chest pain EXAM: CHEST -  2 VIEW COMPARISON:  February 11, 2013 FINDINGS: No edema or consolidation. Heart is slightly enlarged with pulmonary vascularity normal. No adenopathy. There is postoperative change in the lower cervical region. There is an azygos lobe on the right, an anatomic variant. IMPRESSION: Mild cardiomegaly.  No edema or consolidation. Electronically Signed   By: Lowella Grip III M.D.   On: 02/20/2018 11:04   Ct Angio Chest/abd/pel For Dissection W And/or Wo Contrast  Result Date: 02/20/2018 CLINICAL DATA:  Central chest pain radiating into the neck and left breast and groin. Question dissection. EXAM: CT ANGIOGRAPHY CHEST, ABDOMEN AND PELVIS TECHNIQUE: Multidetector CT imaging through the chest, abdomen and pelvis was performed using the standard protocol during bolus administration of intravenous contrast. Multiplanar reconstructed images and MIPs were obtained and reviewed to evaluate the vascular anatomy. CONTRAST:  178mL ISOVUE-370 IOPAMIDOL (ISOVUE-370) INJECTION 76% COMPARISON:  Radiographs same date. Chest CT 01/03/2006. Abdominal CT 06/09/2014. FINDINGS: CTA CHEST FINDINGS Cardiovascular: Pre contrast images demonstrate minimal aortic and coronary artery atherosclerosis. There are no displaced intimal calcifications or mediastinal hematoma. Following contrast, the aorta and great vessels enhance normally. No evidence of aneurysm, dissection or large vessel occlusion. There is satisfactory opacification of the pulmonary arteries, and no evidence of acute pulmonary embolism. The heart size is normal. There is no pericardial effusion. Mediastinum/Nodes: There are no enlarged mediastinal, hilar or axillary lymph nodes. The thyroid gland, trachea and esophagus demonstrate no significant findings. Lungs/Pleura: No pleural effusion or pneumothorax. There is mild emphysema and subsegmental atelectasis. Azygos fissure noted. Musculoskeletal/Chest wall: There is no chest wall mass or suspicious osseous finding.  Review of the MIP images confirms the above findings. CTA ABDOMEN AND PELVIS FINDINGS VASCULAR Aorta: Minimal atherosclerosis. No aneurysm, dissection or stenosis. Celiac: Normal.  Conventional anatomy. SMA: Normal. Renals: Normal. IMA: Normal Inflow: Normal Veins: Limited opacification without apparent abnormality. Review of the MIP images confirms the above findings. NON-VASCULAR Hepatobiliary: No focal hepatic abnormality or abnormal enhancement. No evidence of gallstones, gallbladder wall thickening or surrounding inflammation. Pancreas: Unremarkable. No evidence of pancreatic mass, ductal dilatation or surrounding inflammation. Spleen: Normal in size without focal abnormality. Adrenals/Urinary Tract: There is a left adrenal nodule measuring 2.8 x 2.7 cm on image 58/12. This has a nonspecific density of 25 HU and enhances in a nonspecific fashion following contrast. This nodule is not visible on the remote chest CT, but may not have been imaged at that time. It is visible on a lumbar spine CT done 06/30/2014 and abdominal CT 06/09/2014, and has not grossly changed since then, consistent with an adenoma. The right adrenal gland appears normal. The kidneys, ureters and bladder appear normal. Stomach/Bowel: No bowel wall thickening, distention or surrounding inflammation. Lymphatic: No enlarged abdominopelvic lymph nodes. Reproductive: Hysterectomy.  No adnexal mass. Other: Intact anterior abdominal wall.  No ascites or free air. Musculoskeletal: No acute osseous findings. Previous L1 through L5 lumbar fusion. Review of the MIP images confirms the above findings. IMPRESSION: 1. No acute vascular findings in the chest, abdomen or pelvis. Minimal aortic atherosclerosis. 2. Stable left adrenal adenoma. Electronically Signed   By: Richardean Sale  M.D.   On: 02/20/2018 12:56    Assessment and Plan:   1. Unstable angina, has multiple risk factors for CAD.  troponins neg and no RWMA on Echo.  Recent CVA in August or  thereabouts,  No acute symptoms then but found with loss of partial vision on eye exam and then MRI of brain with CVA.  Recently seen by Dr. Leonie Ho, MRA of brain pending.  +FH of premature CAD in her father and she with HTN, pre-diabetes.  Would recommend cardiac ischemic eval with lexiscan or cardiac CTA.  Dr. Harl Bowie to see. 2. Hx CVA in August.  3. HLD add statin with hx of CVA and FH of premature CAD lipitor 40 mg      For questions or updates, please contact [place] Please consult [email or web address] for contact info under     Signed, Cecilie Kicks, NP  02/21/2018 10:06 AM   Patient seen and discussed with PA Dorene Ar, I agree with her documentation above. 62 yo female history of obesity, HTN, Stroke prediabetes, family hisotry of MI in father in his 71s admitted with chest pain.   Mixed symptoms. Pressure midchest 7-8/10 in severity with some nausea, varying in severity for 6-8 hours. DId have some improvement with NG.   K 3.9 Cr 0.97 BUN 16 WBC 10.4 Hgb 14.5 Plt 322  Trop negx4 EKG SR, nonspecifiic ST/T changes chronic CXR mild cardiomegaly, no acute process CTA chest no acute process. Minimal atherosclerosis Echo LVEF 55-60%, no WMAs, grade I diastolic dysfunction  62 yo female presents with mixed chest pain, multiple CAD risk factors including father with MI in his 22s. No evidence of ishcemia by EKG or enzymes, will plan for a lexiscan to further evalaute. WIth recent CTA chest would avoid repeat dye load today for a coronary CTA.   Carlyle Dolly MD

## 2018-02-21 NOTE — Discharge Summary (Signed)
Physician Discharge Summary  MAHATHI POKORNEY ZSW:[id number] DOB: 1955-10-13 DOA: 02/20/2018  PCP: Rochel Brome, MD  Admit date: 02/20/2018 Discharge date: 02/21/2018  Admitted From: home Discharge disposition: home   Recommendations for Outpatient Follow-Up:   1. PFTs 2. Pravachol started for elevated LDL above 70- follow lipids   Discharge Diagnosis:   Principal Problem:   Chest pain    Discharge Condition: Improved.  Diet recommendation: Low sodium, heart healthy  Wound care: None.  Code status: Full.   History of Present Illness:   Caitlin Ho is a 62 y.o. female with medical history significant for obesity, hypertension, stroke, prediabetes, family history of MI in father in his 58s, history of multiple back surgery, cervical fusion presented to the ER with complaint of chest pain this morning.  Patient reports that since Wednesday patient has been having left neck and left shoulder pain.  Patient reports since this morning she is having chest pain on the left sternal border area, described as pressure sensation without any radiation with associated nausea, diaphoresis shortness of breath.  Her pain was somewhat improved with nitroglycerin. She denies any fever, vomiting, abdominal pain, focal weakness, melena/hematochezia.   Hospital Course by Problem:   Chest pain -with both typical and atypical features.  - EKG with no overt signs of ischemia.  - CT Angio chest abdomen pelvis without PE/dissection -stress test low risk-- per cards no need for further work up  Low/normal O2 sat -patient was asymptomatic when low-- ? Baseline -is c/o allergy like symptoms but CT scan was ? Emphysema (h/o second hand smoke)  Hypertension -resume home meds  Hypothyroidism continue Synthroid.  History of multiple back surgery, continue her pain regimen, muscle relaxants. -recently taken off 1 medication  History of recent silent stroke, she is being followed by  neurology.  Continue her aspirin -added statin for LDL goal of <70    Medical Consultants:      Discharge Exam:   Vitals:   02/21/18 1157 02/21/18 1159  BP:    Pulse: 90 81  Resp:    Temp:    SpO2:     Vitals:   02/21/18 1153 02/21/18 1155 02/21/18 1157 02/21/18 1159  BP: 128/83 120/79    Pulse: 91 86 90 81  Resp:      Temp:      TempSrc:      SpO2:      Weight:      Height:        General exam: Appears calm and comfortable. Distant air sounds, no wheezing   The results of significant diagnostics from this hospitalization (including imaging, microbiology, ancillary and laboratory) are listed below for reference.     Procedures and Diagnostic Studies:   Dg Chest 2 View  Result Date: 02/20/2018 CLINICAL DATA:  Chest pain EXAM: CHEST - 2 VIEW COMPARISON:  February 11, 2013 FINDINGS: No edema or consolidation. Heart is slightly enlarged with pulmonary vascularity normal. No adenopathy. There is postoperative change in the lower cervical region. There is an azygos lobe on the right, an anatomic variant. IMPRESSION: Mild cardiomegaly.  No edema or consolidation. Electronically Signed   By: Lowella Grip III M.D.   On: 02/20/2018 11:04   Nm Myocar Multi W/spect W/wall Motion / Ef  Result Date: 02/21/2018  There was no ST segment deviation noted during stress.  Defect 1: There is a medium defect of moderate severity present in the mid anterior, mid anteroseptal and apical anterior  location.  This is a low risk study.  Nuclear stress EF: 63%.  The left ventricular ejection fraction is normal (55-65%).  Stress nuclear study with probable soft tissue attenuation but no ischemia; EF 63 with normal wall motion.   Ct Angio Chest/abd/pel For Dissection W And/or Wo Contrast  Result Date: 02/20/2018 CLINICAL DATA:  Central chest pain radiating into the neck and left breast and groin. Question dissection. EXAM: CT ANGIOGRAPHY CHEST, ABDOMEN AND PELVIS TECHNIQUE:  Multidetector CT imaging through the chest, abdomen and pelvis was performed using the standard protocol during bolus administration of intravenous contrast. Multiplanar reconstructed images and MIPs were obtained and reviewed to evaluate the vascular anatomy. CONTRAST:  133mL ISOVUE-370 IOPAMIDOL (ISOVUE-370) INJECTION 76% COMPARISON:  Radiographs same date. Chest CT 01/03/2006. Abdominal CT 06/09/2014. FINDINGS: CTA CHEST FINDINGS Cardiovascular: Pre contrast images demonstrate minimal aortic and coronary artery atherosclerosis. There are no displaced intimal calcifications or mediastinal hematoma. Following contrast, the aorta and great vessels enhance normally. No evidence of aneurysm, dissection or large vessel occlusion. There is satisfactory opacification of the pulmonary arteries, and no evidence of acute pulmonary embolism. The heart size is normal. There is no pericardial effusion. Mediastinum/Nodes: There are no enlarged mediastinal, hilar or axillary lymph nodes. The thyroid gland, trachea and esophagus demonstrate no significant findings. Lungs/Pleura: No pleural effusion or pneumothorax. There is mild emphysema and subsegmental atelectasis. Azygos fissure noted. Musculoskeletal/Chest wall: There is no chest wall mass or suspicious osseous finding. Review of the MIP images confirms the above findings. CTA ABDOMEN AND PELVIS FINDINGS VASCULAR Aorta: Minimal atherosclerosis. No aneurysm, dissection or stenosis. Celiac: Normal.  Conventional anatomy. SMA: Normal. Renals: Normal. IMA: Normal Inflow: Normal Veins: Limited opacification without apparent abnormality. Review of the MIP images confirms the above findings. NON-VASCULAR Hepatobiliary: No focal hepatic abnormality or abnormal enhancement. No evidence of gallstones, gallbladder wall thickening or surrounding inflammation. Pancreas: Unremarkable. No evidence of pancreatic mass, ductal dilatation or surrounding inflammation. Spleen: Normal in size  without focal abnormality. Adrenals/Urinary Tract: There is a left adrenal nodule measuring 2.8 x 2.7 cm on image 58/12. This has a nonspecific density of 25 HU and enhances in a nonspecific fashion following contrast. This nodule is not visible on the remote chest CT, but may not have been imaged at that time. It is visible on a lumbar spine CT done 06/30/2014 and abdominal CT 06/09/2014, and has not grossly changed since then, consistent with an adenoma. The right adrenal gland appears normal. The kidneys, ureters and bladder appear normal. Stomach/Bowel: No bowel wall thickening, distention or surrounding inflammation. Lymphatic: No enlarged abdominopelvic lymph nodes. Reproductive: Hysterectomy.  No adnexal mass. Other: Intact anterior abdominal wall.  No ascites or free air. Musculoskeletal: No acute osseous findings. Previous L1 through L5 lumbar fusion. Review of the MIP images confirms the above findings. IMPRESSION: 1. No acute vascular findings in the chest, abdomen or pelvis. Minimal aortic atherosclerosis. 2. Stable left adrenal adenoma. Electronically Signed   By: Richardean Sale M.D.   On: 02/20/2018 12:56     Labs:   Basic Metabolic Panel: Recent Labs  Lab 02/20/18 1024  NA 142  K 3.9  CL 106  CO2 25  GLUCOSE 170*  BUN 16  CREATININE 0.97  CALCIUM 9.4   GFR Estimated Creatinine Clearance: 73 mL/min (by C-G formula based on SCr of 0.97 mg/dL). Liver Function Tests: Recent Labs  Lab 02/20/18 1024  AST 18  ALT 14  ALKPHOS 57  BILITOT 0.5  PROT 6.7  ALBUMIN 3.5  No results for input(s): LIPASE, AMYLASE in the last 168 hours. No results for input(s): AMMONIA in the last 168 hours. Coagulation profile No results for input(s): INR, PROTIME in the last 168 hours.  CBC: Recent Labs  Lab 02/20/18 1024  WBC 10.4  NEUTROABS 6.2  HGB 14.5  HCT 44.9  MCV 91.8  PLT 322   Cardiac Enzymes: Recent Labs  Lab 02/20/18 1657 02/20/18 2201  TROPONINI <0.03 <0.03    BNP: Invalid input(s): POCBNP CBG: Recent Labs  Lab 02/21/18 0632  GLUCAP 102*   D-Dimer No results for input(s): DDIMER in the last 72 hours. Hgb A1c No results for input(s): HGBA1C in the last 72 hours. Lipid Profile No results for input(s): CHOL, HDL, LDLCALC, TRIG, CHOLHDL, LDLDIRECT in the last 72 hours. Thyroid function studies No results for input(s): TSH, T4TOTAL, T3FREE, THYROIDAB in the last 72 hours.  Invalid input(s): FREET3 Anemia work up No results for input(s): VITAMINB12, FOLATE, FERRITIN, TIBC, IRON, RETICCTPCT in the last 72 hours. Microbiology No results found for this or any previous visit (from the past 240 hour(s)).   Discharge Instructions:   Discharge Instructions    Diet - low sodium heart healthy   Complete by:  As directed    Discharge instructions   Complete by:  As directed    Your LDL (bad cholesterol) is 105-- Dr. Leonie Man would like it less than 70 for stroke prevention-- I have sent a cholesterol medication to your pharmacy   Increase activity slowly   Complete by:  As directed      Allergies as of 02/21/2018   No Known Allergies     Medication List    TAKE these medications   aspirin EC 81 MG tablet Take 81 mg by mouth daily.   atorvastatin 40 MG tablet Commonly known as:  LIPITOR Take 1 tablet (40 mg total) by mouth daily at 6 PM.   furosemide 40 MG tablet Commonly known as:  LASIX Take 40 mg by mouth daily.   gabapentin 300 MG capsule Commonly known as:  NEURONTIN Take 1 capsule (300 mg total) by mouth 2 (two) times daily.   levothyroxine 125 MCG tablet Commonly known as:  SYNTHROID, LEVOTHROID Take 125 mcg by mouth daily.   methocarbamol 500 MG tablet Commonly known as:  ROBAXIN Take 1 tablet (500 mg total) by mouth every 6 (six) hours as needed for muscle spasms.   metoprolol tartrate 25 MG tablet Commonly known as:  LOPRESSOR Take 25 mg by mouth 2 (two) times daily.   polyethylene glycol packet Commonly  known as:  MIRALAX / GLYCOLAX Take 17 g by mouth daily as needed for mild constipation.   traMADol 50 MG tablet Commonly known as:  ULTRAM Take 50-100 mg by mouth every 6 (six) hours as needed for moderate pain.      Follow-up Information    [facility], Kirsten, MD Follow up in 1 week(s).   Specialties:  Family Medicine, Interventional Cardiology, Radiology, Anesthesiology Contact information: [place] [id number] [phone]            Time coordinating discharge: 25 min  Signed:  Geradine Girt DO  [facility] 02/21/2018, 3:06 PM

## 2018-02-21 NOTE — Progress Notes (Signed)
lexiscan myoview portion completed nuc study results to follow .

## 2018-02-26 ENCOUNTER — Telehealth: Payer: Self-pay | Admitting: Neurology

## 2018-02-26 DIAGNOSIS — E782 Mixed hyperlipidemia: Secondary | ICD-10-CM | POA: Diagnosis not present

## 2018-02-26 DIAGNOSIS — R918 Other nonspecific abnormal finding of lung field: Secondary | ICD-10-CM | POA: Diagnosis not present

## 2018-02-26 DIAGNOSIS — H53462 Homonymous bilateral field defects, left side: Secondary | ICD-10-CM | POA: Diagnosis not present

## 2018-02-26 DIAGNOSIS — R072 Precordial pain: Secondary | ICD-10-CM | POA: Diagnosis not present

## 2018-02-26 DIAGNOSIS — R7303 Prediabetes: Secondary | ICD-10-CM | POA: Diagnosis not present

## 2018-02-26 DIAGNOSIS — G4733 Obstructive sleep apnea (adult) (pediatric): Secondary | ICD-10-CM | POA: Diagnosis not present

## 2018-02-26 NOTE — Telephone Encounter (Signed)
Pt's PCP/Dr Johny Chess is wanting pt to have a home sleep study. Pt is wanting to know if this will be ok with Dr Leonie Man or if he prefers she keep the sleep study already scheduled with our sleep lab on 04/08/18. She is aware Dr Leonie Man is out of the office until 03/16/18. Since the pt is aware he is out until 1/13 she has decided to keep the appt on 2/5. FYI

## 2018-02-28 ENCOUNTER — Ambulatory Visit
Admission: RE | Admit: 2018-02-28 | Discharge: 2018-02-28 | Disposition: A | Discharge status: Home / Self Care | Payer: PPO | Source: Ambulatory Visit | Attending: Neurology | Admitting: Neurology

## 2018-02-28 DIAGNOSIS — I639 Cerebral infarction, unspecified: Secondary | ICD-10-CM | POA: Diagnosis not present

## 2018-02-28 DIAGNOSIS — H53462 Homonymous bilateral field defects, left side: Secondary | ICD-10-CM

## 2018-02-28 MED ORDER — GADOBENATE DIMEGLUMINE 529 MG/ML IV SOLN
20.0000 mL | Freq: Once | INTRAVENOUS | Status: AC | PRN
  Administered 2018-02-28: 20 mL via INTRAVENOUS

## 2018-03-06 ENCOUNTER — Telehealth: Payer: Self-pay | Admitting: Neurology

## 2018-03-06 NOTE — Telephone Encounter (Signed)
  I called the patient.  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  No clear source of the prior stroke event is identified.  MRA head 03/03/18:  IMPRESSION:   Normal variant MRA head (without). No significant stenoses or occlusions.     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  There is normal appearing flow signal superiorly to the skull base, where the right vertebral artery terminates mainly into the right posterior inferior cerebellar artery. - Probable artifactual signal loss near the left common carotid artery and left vertebral artery, with normal appearing signal inferiorly and superiorly. This may be related to patient's cervical spine surgery metallic hardware.

## 2018-04-08 ENCOUNTER — Ambulatory Visit (INDEPENDENT_AMBULATORY_CARE_PROVIDER_SITE_OTHER): Payer: PPO | Admitting: Neurology

## 2018-04-08 ENCOUNTER — Encounter: Payer: Self-pay | Admitting: Neurology

## 2018-04-08 VITALS — BP 117/83 | HR 75 | Ht 65.0 in | Wt 223.0 lb

## 2018-04-08 DIAGNOSIS — Z9189 Other specified personal risk factors, not elsewhere classified: Secondary | ICD-10-CM

## 2018-04-08 DIAGNOSIS — R0683 Snoring: Secondary | ICD-10-CM | POA: Diagnosis not present

## 2018-04-08 DIAGNOSIS — E890 Postprocedural hypothyroidism: Secondary | ICD-10-CM

## 2018-04-08 DIAGNOSIS — M4322 Fusion of spine, cervical region: Secondary | ICD-10-CM

## 2018-04-08 DIAGNOSIS — H53462 Homonymous bilateral field defects, left side: Secondary | ICD-10-CM | POA: Diagnosis not present

## 2018-04-08 DIAGNOSIS — E669 Obesity, unspecified: Secondary | ICD-10-CM | POA: Diagnosis not present

## 2018-04-08 DIAGNOSIS — Z9009 Acquired absence of other part of head and neck: Secondary | ICD-10-CM

## 2018-04-08 DIAGNOSIS — I639 Cerebral infarction, unspecified: Secondary | ICD-10-CM | POA: Diagnosis not present

## 2018-04-08 NOTE — Progress Notes (Signed)
SLEEP MEDICINE CLINIC   Provider:  Larey Seat, M D  Primary Care Physician:  Rochel Brome, MD   Dr Leonie Man, MD stroke service.   Chief Complaint  Patient presents with  . New Patient (Initial Visit)    pt with husband, rm 24. pt states that she has been told snores and has witnessed apnea  as well. never had sleep study. recently was diagnosed with stroke and here to rule out sleep apnea.     HPI:  Caitlin Ho is a 64 y.o. female occipital stroke patient is seen on 04-08-2018  in a referral from Dr Leonie Man.   Referring Provider: Dr Antony Contras referred this patient after a Dec 16th 2029 visit during which several tests were ordered. He is scheduled to see her 2 month after consult, February 16th?  Sleep habits are as follows: Mr. and Mrs. Math usually have dinner around 6 PM, usually watching TV after dinner. Bedtime has shifted to a later time between 11 PM and midnight since Mr. Happ retired 03-24-2018. The patient usually sleeps on her side, using 2 pillows for head support. She may have nocturia once or twice, but usually can go back to sleep.  She does recall dreaming and she seems to dream frequently, not nightmarish.  Mr. Nodarse has noticed that his wife snores and that her snoring is louder when she is in supine position. He has also witnessed apneas.  Sometimes she seems to gargle or choke.  The patient rises around 8 AM.  Most mornings she feels refreshed and restored.       Dr Clydene Fake note:  Medical history : Referring MD: Kristeen Miss Reason for Referral:  Right occipital infarct HPI: Caitlin Ho is a pleasant 63 year old Caucasian lady seen today for initial office consultation visit.  She is accompanied by her husband.  History is obtained from them and review of referral notes and electronic medical records.  I have personally reviewed imaging films in PACS.  Patient states she has had no definite symptoms suggestive of stroke however during a routine eye exam  in August she was noted by the ophthalmologist to have left homonymous hemianopsia.  Surprisingly she is not known about this or had any any vision difficulties.  She has not been bumping into objects and does not say that she really feels people are creeping up on her.  She has had no accidents or injuries.  She had a previous eye exam a year ago where this was clearly not found.  Patient saw Dr. Ellene Route neurosurgeon who has done cervical spine surgery in October 17 as well as multiple back surgeries in the past.  I personally reviewed imaging films of MRI scan of the brain with and without contrast that she had on 01/01/2018 which shows encephalomalacia and gliosis in the right medial occipital lobe likely an old stroke.  No vascular imaging , echocardiogram or stroke specific labs wer done.  Patient denies any history of headaches, significant left-sided peripheral vision loss.  In fact she has been driving without accidents.  She denies any double vision, vertigo, gait or balance problems.  She denies any memory difficulties and has been able to do all her activities of daily living.  There is no prior history of strokes TIAs seizures or significant neurological problems.  Her dad died of MI in his 68s.  Patient does give a history of several days of palpitations in the past for which he underwent Holter monitoring and she was  found to have a short beat of V. tach and metoprolol had been prescribed with the patient chose not to take it.  There is no definite history of atrial fibrillation.  She does admit to snoring and has restless sleep and husband feels convinced that she may have sleep apnea.  She has not been formally tested for the same but is willing to be referred for evaluation now.  There is no history of deep vein thrombosis pulmonary embolism or rash.  She has not had any evaluation for stroke yet.  She has recently been started on aspirin which is tolerating well.  She does not smoke or  drink.  ROS:   14 system review of systems is positive for palpitations, loss of vision, snoring and all other systems negative    Family sleep history: The patient has no knowledge of any family members being diagnosed with sleep apnea.  She is not sure that anybody else suffers from any kind of sleep disorder.   Social history: 1 adult daughter.  The patient is retired, she lives with her husband who recently on 24 March 2018 also retired.  The patient does not smoke nor drink.  She drinks coffee in the morning and she drinks caffeinated sodas in the afternoon before dinner. 16 ounces.  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    Review of Systems: Out of a complete 14 system review, the patient complains of only the following symptoms, and all other reviewed systems are negative.  witnessed apneas. snoring , left homonymous vision loss.  Weight gain.   Epworth score 4/ 24  , Fatigue severity score 19/ 63   , depression score 2/ 15 .  No RLS, thyroid surgery and anterior cervical fusion, leg jerking.    Social History   Socioeconomic History  . Marital status: Married    Spouse name: Not on file  . Number of children: Not on file  . Years of education: Not on file  . Highest education level: Not on file  Occupational History  . Not on file  Social Needs  . Financial resource strain: Not on file  . Food insecurity:    Worry: Not on file    Inability: Not on file  . Transportation needs:    Medical: Not on file    Non-medical: Not on file  Tobacco Use  . Smoking status: Never Smoker  . Smokeless tobacco: Never Used  Substance and Sexual Activity  . Alcohol use: No  . Drug use: No  . Sexual activity: Not Currently    Birth control/protection: Abstinence  Lifestyle  . Physical activity:    Days per week: Not on file    Minutes per session: Not on file  . Stress: Not on file  Relationships  . Social connections:    Talks on phone: Not on file     Gets together: Not on file    Attends religious service: Not on file    Active member of club or organization: Not on file    Attends meetings of clubs or organizations: Not on file    Relationship status: Not on file  . Intimate partner violence:    Fear of current or ex partner: Not on file    Emotionally abused: Not on file    Physically abused: Not on file    Forced sexual activity: Not on file  Other Topics Concern  . Not on file  Social History Narrative  . Not  on file    Family History  Problem Relation Age of Onset  . Heart attack Father 64    Past Medical History:  Diagnosis Date  . Cancer ([place])    cervical cancer cells whwn patient was 22   . Diabetes mellitus without complication (HCC)    borderline  . GERD (gastroesophageal reflux disease)    not on medication  . Headache(784.0)    sinus  . Homonymous hemianopia   . Hypertension   . Hypothyroid   . PONV (postoperative nausea and vomiting)    06/20/14 patient reported that BP dropped real low and she had N/V  and was slow to awaken when she had surgery at an outpatient center.  . Seasonal allergies   . Stroke [facility])     Past Surgical History:  Procedure Laterality Date  . ABDOMINAL HYSTERECTOMY    . APPENDECTOMY  1978  . BACK SURGERY  02/16/13   3rd since May  3 fusions  . c5-c6 fusion    . LUMBAR DISC SURGERY  10/03/2012   Dr Ellene Route  2 Discectomy  . THYROIDECTOMY  2006  . THYROIDECTOMY  2006    Current Outpatient Medications  Medication Sig Dispense Refill  . aspirin EC 81 MG tablet Take 81 mg by mouth daily.    Marland Kitchen atorvastatin (LIPITOR) 20 MG tablet Take 20 mg by mouth daily.    . furosemide (LASIX) 40 MG tablet Take 40 mg by mouth daily.     Marland Kitchen gabapentin (NEURONTIN) 300 MG capsule Take 1 capsule (300 mg total) by mouth 2 (two) times daily.    Marland Kitchen levothyroxine (SYNTHROID, LEVOTHROID) 125 MCG tablet Take 125 mcg by mouth daily.  0  . methocarbamol (ROBAXIN) 500 MG tablet Take 1 tablet (500 mg total)  by mouth every 6 (six) hours as needed for muscle spasms. 60 tablet 3  . metoprolol tartrate (LOPRESSOR) 25 MG tablet Take 25 mg by mouth 2 (two) times daily.  2  . polyethylene glycol (MIRALAX / GLYCOLAX) packet Take 17 g by mouth daily as needed for mild constipation.     . pravastatin (PRAVACHOL) 20 MG tablet Take 1 tablet (20 mg total) by mouth daily. 30 tablet 0  . traMADol (ULTRAM) 50 MG tablet Take 50-100 mg by mouth every 6 (six) hours as needed for moderate pain.   1   No current facility-administered medications for this visit.   [facility]  NEUROIMAGING REPORT   STUDY DATE: 02/28/18 PATIENT NAME: Caitlin Ho DOB: Jul 12, 1955 MRN: [id number]  ORDERING CLINICIAN: Antony Contras, MD  CLINICAL HISTORY: 64 year old female with stroke.  EXAM: MRA neck (with and without)  FINDINGS:   This study is of adequate technical quality. There is anterograde flow in the bilateral vertebral and carotid arteries on 2D-TOF views.  The flow signal of the left subclavian artery has no stenosis.   There is artifactual signal loss near the left common carotid artery and left vertebral artery, with normal appearing signal inferiorly and superiorly. This may be related to patient's cervical spine surgery metallic hardware. The remaining left common, internal and external carotid arteries have no stenosis.  The left vertebral artery has no stenosis from its origin up to the vertebrobasilar junction.    On the right brachiocephalic trunk and subclavian arteries have no stenosis. The right common, internal and external carotid arteries have no stenosis. 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  There is normal appearing flow signal superiorly to the skull base, where the right vertebral artery terminates mainly into the right posterior inferior cerebellar artery. Limited views of the intracranial vasculature are  unremarkable.   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  There is normal appearing flow signal superiorly to the skull base, where the right vertebral artery terminates mainly into the right posterior inferior cerebellar artery. - Probable artifactual signal loss near the left common carotid artery and left vertebral artery, with normal appearing signal inferiorly and superiorly. This may be related to patient's cervical spine surgery metallic hardware  INTERPRETING PHYSICIAN:  Penni Bombard, MD Certified in Neurology, Neurophysiology and Neuroimaging  [facility] [address] [place], [place] [address] [phone]    stress test 02-22-2028, There was no ST segment deviation noted during stress.  Defect 1: There is a medium defect of moderate severity present in the mid anterior, mid anteroseptal and apical anterior location.  This is a low risk study.  Nuclear stress EF: 63%.  The left ventricular ejection fraction is normal (55-65%).   Stress nuclear study with probable soft tissue attenuation but no ischemia; EF 63 with normal wall motion.   Allergies as of 04/08/2018  . (No Known Allergies)    Vitals: BP 117/83   Pulse 75   Ht 5\' 5"  (1.651 m)   Wt 223 lb (101.2 kg)   LMP  (LMP Unknown)   BMI 37.11 kg/m  Last Weight:  Wt Readings from Last 1 Encounters:  04/08/18 223 lb (101.2 kg)   FGH:WEXH mass index is 37.11 kg/m.     Last Height:   Ht Readings from Last 1 Encounters:  04/08/18 5\' 5"  (1.651 m)    Physical exam:  General: The patient is awake, alert and appears not in acute distress. The patient is well groomed. Head: Normocephalic, atraumatic. Neck is supple. Mallampati 5 - uvula cannot be seen. ,  neck circumference:16" . Nasal airflow patent ,Cardiovascular:  Regular rate and rhythm ,  without  murmurs or carotid bruit, and without distended neck veins. Respiratory: Lungs are clear to auscultation. Skin:  Without evidence of edema, or rash Trunk: BMI is 37.11 kg/m2   . The patient's posture is relaxed.   Neurologic exam : The patient is awake and alert, oriented to place and time.   Memory subjective described as intact.   Attention span & concentration ability appears normal.  Speech is fluent, Cranial nerves: Pupils are equal and briskly reactive to light. Funduscopic exam without evidence of pallor or edema. Visual fields by finger perimetry are almost intact- she was able to follow a moving object well to the right . Hearing to finger rub intact.   Facial sensation intact to fine touch.  Facial motor strength is symmetric and tongue and uvula move midline. Shoulder shrug was symmetrical.   Motor exam: Normal tone, muscle bulk and symmetric strength in all extremities.  Sensory:  Fine touch, pinprick and vibration were tested in all extremities. Proprioception tested in the upper extremities was normal.  Coordination: Rapid alternating movements in the fingers/hands was normal. Finger-to-nose maneuver  normal without evidence of ataxia, dysmetria or tremor.  Gait and station: Patient walks without assistive Deep tendon reflexes: in the upper and lower extremities are symmetric and intact. Babinski maneuver response is downgoing.  Assessment:  After physical and neurologic  examination, review of laboratory studies,  Personal review of imaging studies, reports of other /same  Imaging studies, results of polysomnography and / or neurophysiology testing and pre-existing records as far as provided in visit., my assessment is:    1) I feel that her visual filed must have increased - she was able to look well into the periphery on the left.   2) Her presumed stroke was unable to be related to a cause, Stress test echo and MRA were normal.   3) I am happy to order a sleep  study for this overweight female patient with witnessed apneas, snoring and large neck, her upper airway may have further been restricted by discectomy / anterior cervical fusions- 2014, 2015 and 2016, as well as thyroid surgery 2006.    The patient was advised of the nature of the diagnosed disorder , the treatment options and the  risks for general health and wellness arising from not treating the condition.   I spent more than 35 minutes of face to face time with the patient.  Greater than 50% of time was spent in counseling and coordination of care. We have discussed the diagnosis and differential and I answered the patient's questions.    Plan:  Treatment plan and additional workup : I will order a sleep study for the patient depending on her insurance coverage this will be either a home sleep test or an attended sleep study. I am also happy to hear that the patient has a follow-up appointment with ophthalmology for a visual field determination. hemianopsia of more than 45 degrees as initially described in her consult visit with Dr. Leonie Man, MD.    Rv with Dr.Sethi has to be scheduled.     Larey Seat, MD 05/06/4560, 5:63 PM  Certified in Neurology by ABPN Certified in [place] by [facility] [address], [place], [place] [address]

## 2018-04-16 DIAGNOSIS — H53453 Other localized visual field defect, bilateral: Secondary | ICD-10-CM | POA: Diagnosis not present

## 2018-06-01 DIAGNOSIS — Z6838 Body mass index (BMI) 38.0-38.9, adult: Secondary | ICD-10-CM | POA: Diagnosis not present

## 2018-06-01 DIAGNOSIS — I10 Essential (primary) hypertension: Secondary | ICD-10-CM | POA: Diagnosis not present

## 2018-06-01 DIAGNOSIS — I69398 Other sequelae of cerebral infarction: Secondary | ICD-10-CM | POA: Diagnosis not present

## 2018-06-01 DIAGNOSIS — R7303 Prediabetes: Secondary | ICD-10-CM | POA: Diagnosis not present

## 2018-06-01 DIAGNOSIS — E782 Mixed hyperlipidemia: Secondary | ICD-10-CM | POA: Diagnosis not present

## 2018-06-01 DIAGNOSIS — H53462 Homonymous bilateral field defects, left side: Secondary | ICD-10-CM | POA: Diagnosis not present

## 2018-07-28 DIAGNOSIS — M79662 Pain in left lower leg: Secondary | ICD-10-CM | POA: Diagnosis not present

## 2018-07-29 DIAGNOSIS — E782 Mixed hyperlipidemia: Secondary | ICD-10-CM | POA: Diagnosis not present

## 2018-07-29 DIAGNOSIS — I10 Essential (primary) hypertension: Secondary | ICD-10-CM | POA: Diagnosis not present

## 2018-07-29 DIAGNOSIS — M79662 Pain in left lower leg: Secondary | ICD-10-CM | POA: Diagnosis not present

## 2018-07-29 DIAGNOSIS — R2242 Localized swelling, mass and lump, left lower limb: Secondary | ICD-10-CM | POA: Diagnosis not present

## 2018-07-29 DIAGNOSIS — Z79899 Other long term (current) drug therapy: Secondary | ICD-10-CM | POA: Diagnosis not present

## 2018-08-17 ENCOUNTER — Telehealth: Payer: Self-pay

## 2018-08-17 NOTE — Telephone Encounter (Signed)
We have attempted to call the patient two times to schedule sleep study.  Patient has been unavailable at the phone numbers we have on file and has not returned our calls.  We have left detailed messages for patient to return our calls asap.  If patient calls back we will schedule them for their sleep study.  

## 2018-10-18 ENCOUNTER — Ambulatory Visit (INDEPENDENT_AMBULATORY_CARE_PROVIDER_SITE_OTHER): Payer: PPO | Admitting: Neurology

## 2018-10-18 DIAGNOSIS — R0683 Snoring: Secondary | ICD-10-CM

## 2018-10-18 DIAGNOSIS — E890 Postprocedural hypothyroidism: Secondary | ICD-10-CM

## 2018-10-18 DIAGNOSIS — Z9189 Other specified personal risk factors, not elsewhere classified: Secondary | ICD-10-CM

## 2018-10-18 DIAGNOSIS — M4322 Fusion of spine, cervical region: Secondary | ICD-10-CM

## 2018-10-18 DIAGNOSIS — I639 Cerebral infarction, unspecified: Secondary | ICD-10-CM

## 2018-10-18 DIAGNOSIS — G4733 Obstructive sleep apnea (adult) (pediatric): Secondary | ICD-10-CM

## 2018-10-18 DIAGNOSIS — G4736 Sleep related hypoventilation in conditions classified elsewhere: Secondary | ICD-10-CM

## 2018-10-18 DIAGNOSIS — Z9009 Acquired absence of other part of head and neck: Secondary | ICD-10-CM

## 2018-10-18 DIAGNOSIS — E669 Obesity, unspecified: Secondary | ICD-10-CM

## 2018-10-18 DIAGNOSIS — H53462 Homonymous bilateral field defects, left side: Secondary | ICD-10-CM

## 2018-10-21 DIAGNOSIS — H5203 Hypermetropia, bilateral: Secondary | ICD-10-CM | POA: Diagnosis not present

## 2018-10-21 DIAGNOSIS — H53453 Other localized visual field defect, bilateral: Secondary | ICD-10-CM | POA: Diagnosis not present

## 2018-10-25 DIAGNOSIS — G4736 Sleep related hypoventilation in conditions classified elsewhere: Secondary | ICD-10-CM

## 2018-10-25 HISTORY — DX: Sleep related hypoventilation in conditions classified elsewhere: G47.36

## 2018-10-25 NOTE — Procedures (Signed)
PATIENT'S NAME:  Caitlin Ho, Caitlin Ho DOB:      08/18/1955      MR#:    [id number]     DATE OF RECORDING: 10/18/2018  [facility] REFERRING M.D.:  Antony Contras, MD Study Performed:   Baseline Polysomnogram HISTORY:  Mrs. Caitlin Ho was seen in a sleep consultation on 04-09-2018, at the time 63 years old.  She had been evaluated for a stroke and was scheduled in follow up with Dr. Antony Contras. She had a regular eye exam when a homonymous hemianopia was noted, and the work up revealed an occipital stroke. History of post anesthesia nausea and vomiting, obesity, ventricular tachycardia, sleep gasping and snoring.  The patient endorsed the Epworth Sleepiness Scale at 4/24 points, the FSS at 19/63 points.   The patient's weight 223 pounds with a height of 65 (inches), resulting in a BMI of 37.1 kg/m2. The patient's neck circumference measured 16 inches.  CURRENT MEDICATIONS: Aspirin, Lipitor, Lasix, Neurontin, Synthroid, Robaxin, Lopressor, Miralax, Pravachol, Ultram.   PROCEDURE:  This is a multichannel digital polysomnogram utilizing the Somnostar 11.2 system.  Electrodes and sensors were applied and monitored per AASM Specifications.   EEG, EOG, Chin and Limb EMG, were sampled at 200 Hz.  ECG, Snore and Nasal Pressure, Thermal Airflow, Respiratory Effort, CPAP Flow and Pressure, Oximetry was sampled at 50 Hz. Digital video and audio were recorded.      BASELINE STUDY: Lights Out was at 20:51 and Lights On at 05:00.  Total recording time (TRT) was 489.5 minutes, with a total sleep time (TST) of 395 minutes.  The patient's sleep latency was 82 minutes.  REM latency was 318 minutes.  The sleep efficiency was 80.7 %.     SLEEP ARCHITECTURE: WASO (Wake after sleep onset) was 39 minutes.  There were 4.5 minutes in Stage N1, 280.5 minutes Stage N2, 34.5 minutes Stage N3 and 75.5 minutes in Stage REM.  The percentage of Stage N1 was 1.1%, Stage N2 was 71.%, Stage N3 was 8.7% and Stage R (REM sleep) was 19.1%.     RESPIRATORY ANALYSIS:  There were a total of 146 respiratory events:  41 obstructive apneas, 0 central apneas and 105 hypopneas. The patient also had many respiratory event related arousals (RERAs).  Snoring appeared independent of sleep position.     The total APNEA/HYPOPNEA INDEX (AHI) was 22.2/hour and the total RESPIRATORY DISTURBANCE INDEX was 26.2 /hour.  81 events occurred in REM sleep and 128 events in NREM. The REM AHI was 64.4 /hour, versus a non-REM AHI of 12.2. The patient spent 154.5 minutes of total sleep time in the supine position and 241 minutes in non-supine. The supine AHI was 17.9 versus a non-supine AHI of 24.9.  OXYGEN SATURATION & C02:  The Wake baseline 02 saturation was 92%, with the lowest being 63%. Time spent below 89% saturation equaled 358 minutes. The SpO2 remained low for long periods of sleep time.    The arousals were noted as: 52 were spontaneous, 0 were associated with PLMs, and 9 were associated with respiratory events. The patient had a total of 0 Periodic Limb Movements.    Audio and video analysis did not show any abnormal or unusual movements, behaviors, phonations or vocalizations.  Loud Snoring was noted. Hypoxemia was also associated with REM sleep. EKG was in keeping with sinus rhythm (NSR). Nocturia times 2.  Post-study, the patient indicated that sleep was the same as usual.    IMPRESSION:  1. Moderate severity of Obstructive  Sleep Apnea (OSA), associated with severe and prolonged hypoxemia.  2. Loud Snoring.   RECOMMENDATIONS:  1. Advise full-night, attended, CPAP titration study to optimize therapy.  My goal has to be to establish CPAP effect on hypoxemia and if CPAP cannot raise the nadir, we will need to add oxygen.  I attached screen shots.    I certify that I have reviewed the entire raw data recording prior to the issuance of this report in accordance with the Standards of Accreditation of the American Academy of Sleep Medicine  (AASM)      Larey Seat, MD   10-23-2018 Diplomat, American Board of Psychiatry and Neurology  Diplomat, American Board of [place] Director, [place] [facility] [employer]

## 2018-10-25 NOTE — Addendum Note (Signed)
Addended by: Larey Seat on: 10/25/2018 01:35 PM   Modules accepted: Orders

## 2018-11-03 DIAGNOSIS — I10 Essential (primary) hypertension: Secondary | ICD-10-CM | POA: Diagnosis not present

## 2018-11-03 DIAGNOSIS — E782 Mixed hyperlipidemia: Secondary | ICD-10-CM | POA: Diagnosis not present

## 2018-11-03 DIAGNOSIS — R7303 Prediabetes: Secondary | ICD-10-CM | POA: Diagnosis not present

## 2018-11-05 DIAGNOSIS — I69398 Other sequelae of cerebral infarction: Secondary | ICD-10-CM | POA: Diagnosis not present

## 2018-11-05 DIAGNOSIS — R7303 Prediabetes: Secondary | ICD-10-CM | POA: Diagnosis not present

## 2018-11-05 DIAGNOSIS — Z6838 Body mass index (BMI) 38.0-38.9, adult: Secondary | ICD-10-CM | POA: Diagnosis not present

## 2018-11-05 DIAGNOSIS — H53462 Homonymous bilateral field defects, left side: Secondary | ICD-10-CM | POA: Diagnosis not present

## 2018-11-05 DIAGNOSIS — I10 Essential (primary) hypertension: Secondary | ICD-10-CM | POA: Diagnosis not present

## 2018-11-05 DIAGNOSIS — E559 Vitamin D deficiency, unspecified: Secondary | ICD-10-CM | POA: Diagnosis not present

## 2018-11-05 DIAGNOSIS — M5116 Intervertebral disc disorders with radiculopathy, lumbar region: Secondary | ICD-10-CM | POA: Diagnosis not present

## 2018-11-05 DIAGNOSIS — E782 Mixed hyperlipidemia: Secondary | ICD-10-CM | POA: Diagnosis not present

## 2018-11-05 DIAGNOSIS — E038 Other specified hypothyroidism: Secondary | ICD-10-CM | POA: Diagnosis not present

## 2018-11-05 DIAGNOSIS — E890 Postprocedural hypothyroidism: Secondary | ICD-10-CM | POA: Diagnosis not present

## 2018-11-23 ENCOUNTER — Other Ambulatory Visit (HOSPITAL_COMMUNITY): Payer: Self-pay

## 2018-12-22 DIAGNOSIS — E038 Other specified hypothyroidism: Secondary | ICD-10-CM | POA: Diagnosis not present

## 2018-12-22 DIAGNOSIS — E890 Postprocedural hypothyroidism: Secondary | ICD-10-CM | POA: Diagnosis not present

## 2018-12-22 DIAGNOSIS — M5431 Sciatica, right side: Secondary | ICD-10-CM | POA: Diagnosis not present

## 2018-12-22 DIAGNOSIS — E559 Vitamin D deficiency, unspecified: Secondary | ICD-10-CM | POA: Diagnosis not present

## 2018-12-22 DIAGNOSIS — Z6836 Body mass index (BMI) 36.0-36.9, adult: Secondary | ICD-10-CM | POA: Diagnosis not present

## 2019-01-11 DIAGNOSIS — M542 Cervicalgia: Secondary | ICD-10-CM | POA: Diagnosis not present

## 2019-01-11 DIAGNOSIS — S46819A Strain of other muscles, fascia and tendons at shoulder and upper arm level, unspecified arm, initial encounter: Secondary | ICD-10-CM | POA: Diagnosis not present

## 2019-01-14 DIAGNOSIS — Z6836 Body mass index (BMI) 36.0-36.9, adult: Secondary | ICD-10-CM | POA: Diagnosis not present

## 2019-01-14 DIAGNOSIS — Z23 Encounter for immunization: Secondary | ICD-10-CM | POA: Diagnosis not present

## 2019-01-14 DIAGNOSIS — Z0000 Encounter for general adult medical examination without abnormal findings: Secondary | ICD-10-CM | POA: Diagnosis not present

## 2019-02-02 DIAGNOSIS — E034 Atrophy of thyroid (acquired): Secondary | ICD-10-CM | POA: Diagnosis not present

## 2019-02-02 DIAGNOSIS — M25512 Pain in left shoulder: Secondary | ICD-10-CM | POA: Diagnosis not present

## 2019-02-02 DIAGNOSIS — E038 Other specified hypothyroidism: Secondary | ICD-10-CM | POA: Diagnosis not present

## 2019-03-31 DIAGNOSIS — M5416 Radiculopathy, lumbar region: Secondary | ICD-10-CM | POA: Diagnosis not present

## 2019-03-31 DIAGNOSIS — M5412 Radiculopathy, cervical region: Secondary | ICD-10-CM | POA: Diagnosis not present

## 2019-03-31 DIAGNOSIS — H53462 Homonymous bilateral field defects, left side: Secondary | ICD-10-CM | POA: Diagnosis not present

## 2019-03-31 DIAGNOSIS — M47816 Spondylosis without myelopathy or radiculopathy, lumbar region: Secondary | ICD-10-CM | POA: Diagnosis not present

## 2019-03-31 DIAGNOSIS — M47892 Other spondylosis, cervical region: Secondary | ICD-10-CM | POA: Diagnosis not present

## 2019-04-11 ENCOUNTER — Other Ambulatory Visit: Payer: Self-pay | Admitting: Family Medicine

## 2019-04-27 ENCOUNTER — Other Ambulatory Visit: Payer: Self-pay

## 2019-04-28 ENCOUNTER — Other Ambulatory Visit: Payer: Self-pay | Admitting: Family Medicine

## 2019-04-28 MED ORDER — METHOCARBAMOL 500 MG PO TABS: 500.0000 mg | ORAL_TABLET | Freq: Three times a day (TID) | ORAL | 0 refills | Status: DC

## 2019-04-28 MED ORDER — GABAPENTIN 300 MG PO CAPS: 300.0000 mg | ORAL_CAPSULE | Freq: Two times a day (BID) | ORAL | Status: DC

## 2019-04-28 MED ORDER — METOPROLOL TARTRATE 25 MG PO TABS: 25.0000 mg | ORAL_TABLET | Freq: Two times a day (BID) | ORAL | 0 refills | Status: DC

## 2019-04-28 MED ORDER — FUROSEMIDE 40 MG PO TABS: 40.0000 mg | ORAL_TABLET | Freq: Every day | ORAL | 0 refills | Status: DC

## 2019-04-28 MED ORDER — TRAMADOL HCL 50 MG PO TABS: 50.0000 mg | ORAL_TABLET | Freq: Four times a day (QID) | ORAL | 0 refills | Status: DC | PRN

## 2019-04-28 MED ORDER — FENOFIBRATE 160 MG PO TABS: 160.0000 mg | ORAL_TABLET | Freq: Every day | ORAL | 0 refills | Status: DC

## 2019-04-29 ENCOUNTER — Other Ambulatory Visit: Payer: Self-pay

## 2019-05-04 ENCOUNTER — Telehealth: Payer: Self-pay

## 2019-05-04 DIAGNOSIS — Z1231 Encounter for screening mammogram for malignant neoplasm of breast: Secondary | ICD-10-CM | POA: Diagnosis not present

## 2019-05-04 LAB — HM MAMMOGRAPHY

## 2019-05-05 ENCOUNTER — Encounter: Payer: Self-pay | Admitting: Family Medicine

## 2019-05-05 ENCOUNTER — Other Ambulatory Visit: Payer: Self-pay

## 2019-05-05 ENCOUNTER — Ambulatory Visit (INDEPENDENT_AMBULATORY_CARE_PROVIDER_SITE_OTHER): Payer: Medicare HMO | Admitting: Family Medicine

## 2019-05-05 VITALS — BP 110/76 | HR 80 | Temp 97.0°F | Resp 16 | Ht 65.0 in | Wt 216.0 lb

## 2019-05-05 DIAGNOSIS — I10 Essential (primary) hypertension: Secondary | ICD-10-CM

## 2019-05-05 DIAGNOSIS — E038 Other specified hypothyroidism: Secondary | ICD-10-CM | POA: Insufficient documentation

## 2019-05-05 DIAGNOSIS — R7303 Prediabetes: Secondary | ICD-10-CM | POA: Diagnosis not present

## 2019-05-05 DIAGNOSIS — E1169 Type 2 diabetes mellitus with other specified complication: Secondary | ICD-10-CM | POA: Insufficient documentation

## 2019-05-05 DIAGNOSIS — E782 Mixed hyperlipidemia: Secondary | ICD-10-CM | POA: Diagnosis not present

## 2019-05-05 DIAGNOSIS — R7309 Other abnormal glucose: Secondary | ICD-10-CM | POA: Insufficient documentation

## 2019-05-05 DIAGNOSIS — E785 Hyperlipidemia, unspecified: Secondary | ICD-10-CM | POA: Diagnosis not present

## 2019-05-05 DIAGNOSIS — N2581 Secondary hyperparathyroidism of renal origin: Secondary | ICD-10-CM | POA: Diagnosis not present

## 2019-05-05 HISTORY — DX: Essential (primary) hypertension: I10

## 2019-05-05 HISTORY — DX: Type 2 diabetes mellitus with other specified complication: E11.69

## 2019-05-05 HISTORY — DX: Other specified hypothyroidism: E03.8

## 2019-05-05 NOTE — Progress Notes (Signed)
Subjective:  Patient ID: Caitlin Ho, female    DOB: 07/02/1955  Age: 64 y.o. MRN: [id number]  Chief Complaint  Patient presents with  . Hypertension  . Hypothyroidism  . Hyperlipidemia  . Prediabetes    HPI Pt presents for follow up of hypertension.  Her current cardiac medication regimen includes a beta-blocker ( metoprolol ) and Furosemide.  She is tolerating the medication well without side effects.  Compliance with treatment has been good; she takes her medication as directed and follows up as directed.      Pt presents with hyperlipidemia.  Current treatment includes Pravachol and a low cholesterol/low fat diet.  Compliance with treatment has been good; she takes her medication as directed, maintains her low cholesterol diet, follows up as directed, and maintains her exercise regimen.  She denies experiencing any hypercholesterolemia related symptoms.      Caitlin Ho presents with a diagnosis of prediabetes.  Eats healthy.     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 Currently taking aspirin daily.     Postprocedural hypothyroidism details; she is currently taking Synthroid, 125 mcg daily.  She denies any related symptoms.     Social History   Socioeconomic History  . Marital status: Married    Spouse name: Not on file  . Number of children: 1  . Years of education: Not on file  . Highest education level: Not on file  Occupational History  . Not on file  Tobacco Use  . Smoking status: Never Smoker  . Smokeless tobacco: Never Used  Substance and Sexual Activity  . Alcohol use: No  . Drug use: No  . Sexual activity: Not Currently    Birth control/protection: Abstinence  Other Topics Concern  . Not on file  Social History Narrative  . Not on file   Social Determinants of Health   Financial Resource Strain:   . Difficulty of Paying Living Expenses:   Food Insecurity:   . Worried About Charity fundraiser in the  Last Year:   . Arboriculturist in the Last Year:   Transportation Needs:   . Film/video editor (Medical):   Marland Kitchen Lack of Transportation (Non-Medical):   Physical Activity:   . Days of Exercise per Week:   . Minutes of Exercise per Session:   Stress:   . Feeling of Stress :   Social Connections:   . Frequency of Communication with Friends and Family:   . Frequency of Social Gatherings with Friends and Family:   . Attends Religious Services:   . Active Member of Clubs or Organizations:   . Attends Archivist Meetings:   Marland Kitchen Marital Status:    Past Medical History:  Diagnosis Date  . Cancer ([place])    cervical cancer cells whwn patient was 22   . Cervicalgia   . GERD (gastroesophageal reflux disease)    not on medication  . Hashimoto's disease   . Headache(784.0)    sinus  . Homonymous hemianopia   . Hyperlipidemia   . Hypertension   . Hypothyroid   . Other insomnia   . Other sequelae of cerebral infarction   . Palpitations   . PONV (postoperative nausea and vomiting)    06/20/14 patient reported that BP dropped real low and she had N/V  and was slow to awaken when she had surgery at an outpatient center.  . Prediabetes   . Seasonal allergies   .  Stroke ([place])   . Supraventricular tachycardia ([place])   . Vitamin D deficiency    Family History  Problem Relation Age of Onset  . Aneurysm Mother        brain  . Heart attack Father 39  . Cancer Paternal Grandmother        Breast  . Hyperlipidemia Other   . Hypertension Other   . Diabetes Other     Review of Systems  Constitutional: Negative for chills, fatigue and fever.  HENT: Negative for congestion, ear pain and sore throat.   Respiratory: Positive for shortness of breath. Negative for cough.        88% initially, but after taking several deep breaths it returned to normal.   Cardiovascular: Negative for chest pain.  Gastrointestinal: Positive for constipation. Negative for abdominal pain, diarrhea, nausea  and vomiting.       Reflux. Taking otc omeprazole. Taking miralax sporadically for constipation.  Endocrine: Negative for polydipsia, polyphagia and polyuria.  Genitourinary: Negative for dysuria and urgency.  Musculoskeletal: Positive for back pain. Negative for arthralgias and myalgias.       Chronic. Tramadol 2 pills twice a day.  Taking an extra gabapentin at times.   Neurological: Negative for dizziness and headaches.  Psychiatric/Behavioral: Negative for dysphoric mood. The patient is not nervous/anxious.      Objective:  BP 110/76   Pulse 80   Temp (!) 97 F (36.1 C) (Temporal)   Resp 16   Ht 5\' 5"  (1.651 m)   Wt 216 lb (98 kg)   LMP  (LMP Unknown)   BMI 35.94 kg/m   BP/Weight 05/05/2019 04/08/2018 [id number]  Systolic BP A999333 [id number]  Diastolic BP 76 83 79  Wt. (Lbs) 216 223 -  BMI 35.94 37.11 -    Physical Exam Vitals reviewed.  Constitutional:      General: She is not in acute distress.    Appearance: Normal appearance. She is obese.  HENT:     Right Ear: Tympanic membrane and ear canal normal.     Left Ear: Tympanic membrane and ear canal normal.     Nose: Nose normal. No congestion or rhinorrhea.  Eyes:     Conjunctiva/sclera: Conjunctivae normal.  Neck:     Thyroid: No thyroid mass.  Cardiovascular:     Rate and Rhythm: Normal rate and regular rhythm.     Pulses: Normal pulses.     Heart sounds: No murmur.  Pulmonary:     Effort: Pulmonary effort is normal.     Breath sounds: Normal breath sounds.  Abdominal:     General: Bowel sounds are normal.     Palpations: Abdomen is soft. There is no mass.     Tenderness: There is no abdominal tenderness.  Musculoskeletal:        General: Normal range of motion.  Lymphadenopathy:     Cervical: No cervical adenopathy.  Skin:    General: Skin is warm and dry.  Neurological:     Mental Status: She is alert and oriented to person, place, and time.     Cranial Nerves: No cranial nerve deficit.  Psychiatric:         Mood and Affect: Mood normal.        Behavior: Behavior normal.     Lab Results  Component Value Date   WBC 11.0 (H) 05/05/2019   HGB 14.1 05/05/2019   HCT 41.4 05/05/2019   PLT 368 05/05/2019   GLUCOSE 102 (H) 05/05/2019  CHOL 166 05/05/2019   TRIG 155 (H) 05/05/2019   HDL 45 05/05/2019   LDLCALC 94 05/05/2019   ALT 14 02/20/2018   AST 26 05/05/2019   NA 141 05/05/2019   K 4.6 05/05/2019   CL 105 05/05/2019   CREATININE 0.99 05/05/2019   BUN 21 05/05/2019   CO2 25 02/20/2018   TSH 0.431 (L) 05/05/2019   HGBA1C 6.0 (H) 05/05/2019      Assessment & Plan:  1. Essential hypertension, benign Well controlled.  No changes to medicines.  Continue to work on eating a healthy diet and exercise.  Labs drawn today.   2. Prediabetes - Well controlled.   Continue to work on eating a healthy diet and exercise.  Labs drawn today.   3. Mixed hyperlipidemia Well controlled.  No changes to medicines.  Continue to work on eating a healthy diet and exercise.  Labs drawn today.   4. Secondary hypothyroidism Well controlled.  The current medical regimen is effective;  continue present plan and medications.  5. Morbid obesity, bmi 35. Comorbidities  As above hyperlipidemia and hypothyroidism.       Follow-up: Return in about 3 months (around 08/05/2019).  AVS was given to patient prior to departure.  Rochel Brome [facility] [phone]

## 2019-05-06 LAB — COMP. METABOLIC PANEL (12)
AST: 26 IU/L (ref 0–40)
Albumin/Globulin Ratio: 1.8 (ref 1.2–2.2)
Albumin: 4.2 g/dL (ref 3.8–4.8)
Alkaline Phosphatase: 62 IU/L (ref 39–117)
BUN/Creatinine Ratio: 21 (ref 12–28)
BUN: 21 mg/dL (ref 8–27)
Bilirubin Total: 0.2 mg/dL (ref 0.0–1.2)
Calcium: 10.3 mg/dL (ref 8.7–10.3)
Chloride: 105 mmol/L (ref 96–106)
Creatinine, Ser: 0.99 mg/dL (ref 0.57–1.00)
GFR calc Af Amer: 71 mL/min/{1.73_m2} (ref >59)
GFR calc non Af Amer: 62 mL/min/{1.73_m2} (ref >59)
Globulin, Total: 2.4 g/dL (ref 1.5–4.5)
Glucose: 102 mg/dL — ABNORMAL HIGH (ref 65–99)
Potassium: 4.6 mmol/L (ref 3.5–5.2)
Sodium: 141 mmol/L (ref 134–144)
Total Protein: 6.6 g/dL (ref 6.0–8.5)

## 2019-05-06 LAB — HEMOGLOBIN A1C
Est. average glucose Bld gHb Est-mCnc: 126 mg/dL
Hgb A1c MFr Bld: 6 % — ABNORMAL HIGH (ref 4.8–5.6)

## 2019-05-06 LAB — CBC WITH DIFFERENTIAL/PLATELET
Basophils Absolute: 0.1 10*3/uL (ref 0.0–0.2)
Basos: 1 %
EOS (ABSOLUTE): 0.4 10*3/uL (ref 0.0–0.4)
Eos: 4 %
Hematocrit: 41.4 % (ref 34.0–46.6)
Hemoglobin: 14.1 g/dL (ref 11.1–15.9)
Immature Grans (Abs): 0.1 10*3/uL (ref 0.0–0.1)
Immature Granulocytes: 1 %
Lymphocytes Absolute: 4.2 10*3/uL — ABNORMAL HIGH (ref 0.7–3.1)
Lymphs: 38 %
MCH: 29.4 pg (ref 26.6–33.0)
MCHC: 34.1 g/dL (ref 31.5–35.7)
MCV: 86 fL (ref 79–97)
Monocytes Absolute: 0.9 10*3/uL (ref 0.1–0.9)
Monocytes: 8 %
Neutrophils Absolute: 5.4 10*3/uL (ref 1.4–7.0)
Neutrophils: 48 %
Platelets: 368 10*3/uL (ref 150–450)
RBC: 4.79 x10E6/uL (ref 3.77–5.28)
RDW: 12.1 % (ref 11.7–15.4)
WBC: 11 10*3/uL — ABNORMAL HIGH (ref 3.4–10.8)

## 2019-05-06 LAB — LIPID PANEL
Chol/HDL Ratio: 3.7 ratio (ref 0.0–4.4)
Cholesterol, Total: 166 mg/dL (ref 100–199)
HDL: 45 mg/dL (ref >39)
LDL Chol Calc (NIH): 94 mg/dL (ref 0–99)
Triglycerides: 155 mg/dL — ABNORMAL HIGH (ref 0–149)
VLDL Cholesterol Cal: 27 mg/dL (ref 5–40)

## 2019-05-06 LAB — CARDIOVASCULAR RISK ASSESSMENT

## 2019-05-06 LAB — TSH: TSH: 0.431 u[IU]/mL — ABNORMAL LOW (ref 0.450–4.500)

## 2019-05-10 ENCOUNTER — Other Ambulatory Visit: Payer: Self-pay | Admitting: Family Medicine

## 2019-05-11 ENCOUNTER — Other Ambulatory Visit: Payer: Self-pay

## 2019-05-11 MED ORDER — LEVOTHYROXINE SODIUM 125 MCG PO TABS: 125.0000 ug | ORAL_TABLET | Freq: Every day | ORAL | 0 refills | Status: DC

## 2019-05-11 MED ORDER — METOPROLOL TARTRATE 25 MG PO TABS: 25.0000 mg | ORAL_TABLET | Freq: Two times a day (BID) | ORAL | 0 refills | Status: DC

## 2019-05-11 MED ORDER — GABAPENTIN 300 MG PO CAPS: 300.0000 mg | ORAL_CAPSULE | Freq: Two times a day (BID) | ORAL | Status: DC

## 2019-05-11 MED ORDER — TRAMADOL HCL 50 MG PO TABS: 100.0000 mg | ORAL_TABLET | Freq: Three times a day (TID) | ORAL | 0 refills | Status: DC

## 2019-05-11 MED ORDER — OMEPRAZOLE 40 MG PO CPDR: 40.0000 mg | DELAYED_RELEASE_CAPSULE | Freq: Every day | ORAL | 2 refills | Status: DC

## 2019-05-12 ENCOUNTER — Other Ambulatory Visit: Payer: Self-pay

## 2019-05-12 MED ORDER — GABAPENTIN 300 MG PO CAPS: 300.0000 mg | ORAL_CAPSULE | Freq: Two times a day (BID) | ORAL | 0 refills | Status: DC

## 2019-05-12 MED ORDER — OMEPRAZOLE 40 MG PO CPDR: 40.0000 mg | DELAYED_RELEASE_CAPSULE | Freq: Every day | ORAL | 0 refills | Status: DC

## 2019-05-20 ENCOUNTER — Other Ambulatory Visit: Payer: Self-pay | Admitting: Physician Assistant

## 2019-05-20 ENCOUNTER — Encounter: Payer: Self-pay | Admitting: Family Medicine

## 2019-05-20 MED ORDER — METOPROLOL TARTRATE 25 MG PO TABS: 25.0000 mg | ORAL_TABLET | Freq: Two times a day (BID) | ORAL | 1 refills | Status: DC

## 2019-05-20 MED ORDER — METHOCARBAMOL 500 MG PO TABS: 500.0000 mg | ORAL_TABLET | Freq: Three times a day (TID) | ORAL | 2 refills | Status: DC

## 2019-05-24 ENCOUNTER — Encounter: Payer: Self-pay | Admitting: Family Medicine

## 2019-05-24 ENCOUNTER — Other Ambulatory Visit: Payer: Self-pay

## 2019-05-24 DIAGNOSIS — E66812 Obesity, class 2: Secondary | ICD-10-CM

## 2019-05-24 HISTORY — DX: Obesity, class 2: E66.812

## 2019-05-24 NOTE — Telephone Encounter (Signed)
Medication refills completed .

## 2019-05-27 ENCOUNTER — Other Ambulatory Visit: Payer: Self-pay

## 2019-05-28 MED ORDER — GABAPENTIN 300 MG PO CAPS: 300.0000 mg | ORAL_CAPSULE | Freq: Two times a day (BID) | ORAL | 0 refills | Status: DC

## 2019-05-28 MED ORDER — FUROSEMIDE 40 MG PO TABS: 40.0000 mg | ORAL_TABLET | Freq: Every day | ORAL | 0 refills | Status: DC

## 2019-05-28 MED ORDER — FENOFIBRATE 160 MG PO TABS: 160.0000 mg | ORAL_TABLET | Freq: Every day | ORAL | 0 refills | Status: DC

## 2019-06-01 ENCOUNTER — Other Ambulatory Visit: Payer: Self-pay | Admitting: Family Medicine

## 2019-06-02 ENCOUNTER — Other Ambulatory Visit: Payer: Self-pay | Admitting: Physician Assistant

## 2019-06-03 ENCOUNTER — Other Ambulatory Visit: Payer: Self-pay | Admitting: Physician Assistant

## 2019-06-03 MED ORDER — ERGOCALCIFEROL 1.25 MG (50000 UT) PO CAPS: 50000.0000 [IU] | ORAL_CAPSULE | ORAL | 1 refills | Status: DC

## 2019-06-05 ENCOUNTER — Other Ambulatory Visit: Payer: Self-pay | Admitting: Physician Assistant

## 2019-06-07 ENCOUNTER — Other Ambulatory Visit: Payer: Self-pay

## 2019-06-07 MED ORDER — GABAPENTIN 300 MG PO CAPS: 300.0000 mg | ORAL_CAPSULE | Freq: Two times a day (BID) | ORAL | 0 refills | Status: DC

## 2019-06-07 MED ORDER — OMEPRAZOLE 40 MG PO CPDR: 40.0000 mg | DELAYED_RELEASE_CAPSULE | Freq: Every day | ORAL | 0 refills | Status: DC

## 2019-06-07 MED ORDER — ERGOCALCIFEROL 1.25 MG (50000 UT) PO CAPS: 50000.0000 [IU] | ORAL_CAPSULE | ORAL | 1 refills | Status: DC

## 2019-06-11 ENCOUNTER — Other Ambulatory Visit: Payer: Self-pay

## 2019-06-11 MED ORDER — OMEPRAZOLE 40 MG PO CPDR: 40.0000 mg | DELAYED_RELEASE_CAPSULE | Freq: Every day | ORAL | 0 refills | Status: DC

## 2019-06-11 MED ORDER — GABAPENTIN 300 MG PO CAPS: 300.0000 mg | ORAL_CAPSULE | Freq: Two times a day (BID) | ORAL | 0 refills | Status: DC

## 2019-06-11 MED ORDER — ERGOCALCIFEROL 1.25 MG (50000 UT) PO CAPS: 50000.0000 [IU] | ORAL_CAPSULE | ORAL | 1 refills | Status: DC

## 2019-06-16 ENCOUNTER — Telehealth: Payer: Self-pay

## 2019-06-16 ENCOUNTER — Telehealth: Payer: Self-pay | Admitting: Family Medicine

## 2019-06-16 NOTE — Progress Notes (Signed)
  Chronic Care Management   Note  06/16/2019 Name: CAI BABAUTA MRN: [id number] DOB: Nov 23, 1955  MEHAN STICKA is a 64 y.o. year old female who is a primary care patient of Cox, Kirsten, MD. I reached out to [name] by phone today in response to a referral sent by Ms. Asencion Noble PCP, Rochel Brome, MD.   Ms. Bangert was given information about Chronic Care Management services today including:  1. CCM service includes personalized support from designated clinical staff supervised by her physician, including individualized plan of care and coordination with other care providers 2. 24/7 contact phone numbers for assistance for urgent and routine care needs. 3. Service will only be billed when office clinical staff spend 20 minutes or more in a month to coordinate care. 4. Only one practitioner may furnish and bill the service in a calendar month. 5. The patient may stop CCM services at any time (effective at the end of the month) by phone call to the office staff.   Patient did not agree to services and wishes to consider information provided before deciding about enrollment in care management services.   Follow up plan:   Earney [place] [employer] [facility]

## 2019-06-16 NOTE — Telephone Encounter (Signed)
Dr. Tobie Poet stated the pt needs an appointment for some forms that need to be filled out. I left a message for the pt to call back to schedule an appointment. The appointment will need to be 30 minutes.

## 2019-06-20 ENCOUNTER — Other Ambulatory Visit: Payer: Self-pay | Admitting: Family Medicine

## 2019-06-28 NOTE — Progress Notes (Signed)
Subjective:  Patient ID: Caitlin Ho, female    DOB: [birth year]  Age: 64 y.o. MRN: [id number]  Chief Complaint  Patient presents with  . Back Pain    HPI Patient is a 64 year old white female who has been on disability since December 2014 due to her severe back pain. Since December 2014 she has had 6 back surgeries. She had 2 lumbar discectomies and a lumbar fusion in 2014, a lumbar fusion in 2015, lumbar fusion in 2016, and a cervical fusion in 2017. She has chronic daily pain at a level of a 4 out of 10 on a good day and worsens on bad days. She suffers from radicular symptoms on her left leg. She was seen by Dr. Ellene Route in January 2021 for an annual follow-up. He performed x-rays of her neck and lumbar spine. I have included his letter to you for your review although you should have this. She is currently taking gabapentin 300 mg 3 times a day, tramadol 50 mg two 3 times a day and Robaxin 500 mg one 3 times a day. She is requesting to increase the gabapentin to help with her sciatica.  In addition she had a stroke in approximately February 2019. She has left homonymous hemianopia and has no left peripheral vision as a result and is unable to drive. She is currently on aspirin 81 mg a day and fenofibrate 160 mg once daily for the stroke.  She has significant limitations. She is unable to stand, sit, or walk for longer than an hour due to her pain. She is unable to lift any significant weight more than 5 pounds and certainly not for any significant length of time. 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 Her neck and shoulder pain limit her ability to reach above her shoulders and extend her arms outward.  Social Hx    Past Medical History:  Diagnosis Date  . Cancer ([place])    cervical cancer cells whwn patient was 22   . Cervicalgia   . GERD (gastroesophageal reflux disease)    not on medication  .  Hashimoto's disease   . Headache(784.0)    sinus  . Homonymous hemianopia   . Hyperlipidemia   . Hypertension   . Hypothyroid   . Other insomnia   . Other sequelae of cerebral infarction   . Palpitations   . PONV (postoperative nausea and vomiting)    06/20/14 patient reported that BP dropped real low and she had N/V  and was slow to awaken when she had surgery at an outpatient center.  . Prediabetes   . Seasonal allergies   . Stroke ([place])   . Supraventricular tachycardia ([place])   . Vitamin D deficiency    Past Surgical History:  Procedure Laterality Date  . ABDOMINAL HYSTERECTOMY    . APPENDECTOMY  1978  . CERVICAL FUSION  2017   2 levels  . LUMBAR DISC SURGERY  10/03/2012   Dr Ellene Route  2 Discectomy  . [place] SURGERY  07/2012  . LUMBAR FUSION  2015   Fusion (2 levels)  . LUMBAR FUSION  2016   fusion 1 level.  . LUMBAR FUSION  02/16/2013   Fusion lumbar.  . THYROIDECTOMY  2006  . THYROIDECTOMY  2006    Family History  Problem Relation Age of Onset  . Aneurysm Mother        brain  . Heart attack  Father 75  . Cancer Paternal Grandmother        Breast  . Hyperlipidemia Other   . Hypertension Other   . Diabetes Other    Social History   Socioeconomic History  . Marital status: Married    Spouse name: Not on file  . Number of children: 1  . Years of education: Not on file  . Highest education level: Not on file  Occupational History  . Not on file  Tobacco Use  . Smoking status: Never Smoker  . Smokeless tobacco: Never Used  Substance and Sexual Activity  . Alcohol use: No  . Drug use: No  . Sexual activity: Not Currently    Birth control/protection: Abstinence  Other Topics Concern  . Not on file  Social History Narrative  . Not on file   Social Determinants of Health   Financial Resource Strain:   . Difficulty of Paying Living Expenses:   Food Insecurity:   . Worried About Charity fundraiser in the Last Year:   . Arboriculturist in the Last  Year:   Transportation Needs:   . Film/video editor (Medical):   Marland Kitchen Lack of Transportation (Non-Medical):   Physical Activity:   . Days of Exercise per Week:   . Minutes of Exercise per Session:   Stress:   . Feeling of Stress :   Social Connections:   . Frequency of Communication with Friends and Family:   . Frequency of Social Gatherings with Friends and Family:   . Attends Religious Services:   . Active Member of Clubs or Organizations:   . Attends Archivist Meetings:   Marland Kitchen Marital Status:     Review of Systems  Constitutional: Negative for fatigue and fever.  HENT: Negative for congestion, ear pain, sinus pressure and sore throat.   Eyes: Negative for pain.  Respiratory: Negative for cough, chest tightness, shortness of breath and wheezing.   Cardiovascular: Negative for chest pain and palpitations.  Gastrointestinal: Negative for abdominal pain, constipation, diarrhea, nausea and vomiting.  Genitourinary: Negative for dysuria and hematuria.  Musculoskeletal: Positive for arthralgias (Shoulders) and back pain (Back and neck pain with radicular symptoms in the left leg. No radicular symptoms in the arms.). Negative for joint swelling and myalgias.  Skin: Negative for rash.  Neurological: Negative for dizziness, weakness and headaches.  Psychiatric/Behavioral: Negative for dysphoric mood. The patient is not nervous/anxious.      Objective:  BP 124/72   Pulse 72   Temp 97.7 F (36.5 C)   Resp 18   Ht 5\' 5"  (1.651 m)   Wt 219 lb (99.3 kg)   LMP  (LMP Unknown)   BMI 36.44 kg/m   BP/Weight 06/29/2019 123XX123 [id number]  Systolic BP A999333 A999333 [id number]  Diastolic BP 72 76 83  Wt. (Lbs) 219 216 223  BMI 36.44 35.94 37.11    Physical Exam Constitutional:      Appearance: Normal appearance.  Eyes:     General: Lids are normal. Visual field deficit (Loss of left peripheral vision. Right peripheral vision intact) present.  Neck:     Comments: Tender over cervical  paraspinal muscles. Decreased flexion of her neck. Full range of motion in neck extension and rotation to the left and right.  No thoracic tenderness. Patient does have left more than right lumbar para spinal muscles. Negative straight leg raise bilateral. Range of motion limited in the lumbar region. Cardiovascular:     Rate and Rhythm:  Normal rate and regular rhythm.     Pulses: Normal pulses.     Heart sounds: Normal heart sounds. No murmur.  Pulmonary:     Effort: Pulmonary effort is normal. No respiratory distress.     Breath sounds: Normal breath sounds.  Abdominal:     General: Bowel sounds are normal.     Palpations: Abdomen is soft.     Tenderness: There is no abdominal tenderness.  Musculoskeletal:        General: Normal range of motion.     Cervical back: Tenderness present.  Skin:    General: Skin is warm and dry.  Neurological:     Mental Status: She is oriented to person, place, and time.  Psychiatric:        Mood and Affect: Mood normal.        Behavior: Behavior normal.        Thought Content: Thought content normal.        Judgment: Judgment normal.     Lab Results  Component Value Date   WBC 11.0 (H) 05/05/2019   HGB 14.1 05/05/2019   HCT 41.4 05/05/2019   PLT 368 05/05/2019   GLUCOSE 102 (H) 05/05/2019   CHOL 166 05/05/2019   TRIG 155 (H) 05/05/2019   HDL 45 05/05/2019   LDLCALC 94 05/05/2019   ALT 14 02/20/2018   AST 26 05/05/2019   NA 141 05/05/2019   K 4.6 05/05/2019   CL 105 05/05/2019   CREATININE 0.99 05/05/2019   BUN 21 05/05/2019   CO2 25 02/20/2018   TSH 0.431 (L) 05/05/2019   HGBA1C 6.0 (H) 05/05/2019      Assessment & Plan:  1. Chronic neck pain with history of cervical spinal surgery Continue Robaxin.  Continue tramadol. Patient is significantly disabled and therefore unable to work.  2. Lumbar radiculopathy Continue Robaxin, tramadol, and increase gabapentin to 600 mg 3 times a day. Patient is significantly disabled and  therefore unable to work.  3. 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  Recommend against driving. Patient is significantly disabled and therefore unable to work.     Follow-up: No follow-ups on file.  An After Visit Summary was printed and given to the patient.  Rochel Brome [facility] [phone]

## 2019-06-29 ENCOUNTER — Other Ambulatory Visit: Payer: Self-pay

## 2019-06-29 ENCOUNTER — Encounter: Payer: Self-pay | Admitting: Family Medicine

## 2019-06-29 ENCOUNTER — Ambulatory Visit (INDEPENDENT_AMBULATORY_CARE_PROVIDER_SITE_OTHER): Payer: Medicare HMO | Admitting: Family Medicine

## 2019-06-29 VITALS — BP 124/72 | HR 72 | Temp 97.7°F | Resp 18 | Ht 65.0 in | Wt 219.0 lb

## 2019-06-29 DIAGNOSIS — M5416 Radiculopathy, lumbar region: Secondary | ICD-10-CM

## 2019-06-29 DIAGNOSIS — I639 Cerebral infarction, unspecified: Secondary | ICD-10-CM | POA: Insufficient documentation

## 2019-06-29 DIAGNOSIS — M542 Cervicalgia: Secondary | ICD-10-CM | POA: Diagnosis not present

## 2019-06-29 DIAGNOSIS — Z9889 Other specified postprocedural states: Secondary | ICD-10-CM | POA: Diagnosis not present

## 2019-06-29 DIAGNOSIS — I69398 Other sequelae of cerebral infarction: Secondary | ICD-10-CM | POA: Insufficient documentation

## 2019-06-29 DIAGNOSIS — H53469 Homonymous bilateral field defects, unspecified side: Secondary | ICD-10-CM

## 2019-06-29 DIAGNOSIS — G8928 Other chronic postprocedural pain: Secondary | ICD-10-CM

## 2019-06-29 MED ORDER — GABAPENTIN 600 MG PO TABS
600.0000 mg | ORAL_TABLET | Freq: Three times a day (TID) | ORAL | 2 refills | Status: DC
Start: 2019-06-29 — End: 2019-09-07

## 2019-07-09 ENCOUNTER — Other Ambulatory Visit: Payer: Self-pay | Admitting: Family Medicine

## 2019-07-12 ENCOUNTER — Other Ambulatory Visit: Payer: Self-pay

## 2019-07-13 ENCOUNTER — Other Ambulatory Visit: Payer: Self-pay

## 2019-07-13 MED ORDER — TRAMADOL HCL 50 MG PO TABS: 100.0000 mg | ORAL_TABLET | Freq: Three times a day (TID) | ORAL | 0 refills | Status: DC

## 2019-07-17 ENCOUNTER — Other Ambulatory Visit: Payer: Self-pay | Admitting: Physician Assistant

## 2019-07-18 ENCOUNTER — Other Ambulatory Visit: Payer: Self-pay | Admitting: Family Medicine

## 2019-07-19 NOTE — Telephone Encounter (Signed)
fill

## 2019-08-04 NOTE — Progress Notes (Signed)
Subjective:  Patient ID: Caitlin Ho, female    DOB: 1955/05/16  Age: 65 y.o. MRN: [id number]  Chief Complaint  Patient presents with  . Hypothyroidism  . Hyperlipidemia  . Prediabetes  . Hypertension    HPI Pt presents for follow up of hypertension.  Her current cardiac medication regimen includes a beta-blocker ( metoprolol ) and Furosemide.  She is tolerating the medication well without side effects.  Compliance with treatment has been good; she takes her medication as directed and follows up as directed.  She is eating healthy and exercising (community center in [place].)    Pt presents with hyperlipidemia.  Current treatment includes fenofibrate and a low cholesterol/low fat diet.  Compliance with treatment has been good; she takes her medication as directed, maintains her low cholesterol diet, follows up as directed, and maintains her exercise regimen.  She denies experiencing any hypercholesterolemia related symptoms.      Caitlin Ho presents with a diagnosis of prediabetes.  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 Currently taking aspirin daily.     Postprocedural hypothyroidism details; she is currently taking Synthroid, 125 mcg daily.  She denies any related symptoms.     Persistent lumbar back pain and some neck pain with radiculopathy. Increased gabapentin at her last visit and it has helped.   Social History   Socioeconomic History  . Marital status: Married    Spouse name: Not on file  . Number of children: 1  . Years of education: Not on file  . Highest education level: Not on file  Occupational History  . Not on file  Tobacco Use  . Smoking status: Never Smoker  . Smokeless tobacco: Never Used  Vaping Use  . Vaping Use: Never used  Substance and Sexual Activity  . Alcohol use: No  . Drug use: No  . Sexual activity: Not Currently    Birth control/protection: Abstinence   Other Topics Concern  . Not on file  Social History Narrative  . Not on file   Social Determinants of Health   Financial Resource Strain:   . Difficulty of Paying Living Expenses:   Food Insecurity:   . Worried About Charity fundraiser in the Last Year:   . Arboriculturist in the Last Year:   Transportation Needs:   . Film/video editor (Medical):   Marland Kitchen Lack of Transportation (Non-Medical):   Physical Activity:   . Days of Exercise per Week:   . Minutes of Exercise per Session:   Stress:   . Feeling of Stress :   Social Connections:   . Frequency of Communication with Friends and Family:   . Frequency of Social Gatherings with Friends and Family:   . Attends Religious Services:   . Active Member of Clubs or Organizations:   . Attends Archivist Meetings:   Marland Kitchen Marital Status:    Past Medical History:  Diagnosis Date  . Cancer ([place])    cervical cancer cells whwn patient was 22   . Cervicalgia   . GERD (gastroesophageal reflux disease)    not on medication  . Hashimoto's disease   . Headache(784.0)    sinus  . Homonymous hemianopia   . Hyperlipidemia   . Hypertension   . Hypothyroid   . Other insomnia   . Other sequelae of cerebral infarction   . Palpitations   . PONV (  postoperative nausea and vomiting)    06/20/14 patient reported that BP dropped real low and she had N/V  and was slow to awaken when she had surgery at an outpatient center.  . Prediabetes   . Seasonal allergies   . Stroke ([place])   . Supraventricular tachycardia ([place])   . Vitamin D deficiency    Family History  Problem Relation Age of Onset  . Aneurysm Mother        brain  . Heart attack Father 56  . Cancer Paternal Grandmother        Breast  . Hyperlipidemia Other   . Hypertension Other   . Diabetes Other     Review of Systems  Constitutional: Negative for chills, fatigue and fever.  HENT: Negative for congestion, ear pain and sore throat.   Respiratory: Negative for cough  and shortness of breath.        88% initially, but after taking several deep breaths it returned to normal.   Cardiovascular: Negative for chest pain.  Gastrointestinal: Negative for abdominal pain, constipation, diarrhea, nausea and vomiting.       Reflux. Taking otc omeprazole. Taking miralax sporadically for constipation.  Endocrine: Negative for polydipsia, polyphagia and polyuria.  Genitourinary: Negative for dysuria and urgency.  Musculoskeletal: Positive for arthralgias. Negative for back pain and myalgias.       Chronic. Tramadol 2 pills twice a day.  Taking an extra gabapentin at times.   Neurological: Negative for dizziness and headaches.  Psychiatric/Behavioral: Negative for dysphoric mood. The patient is not nervous/anxious.      Objective:  BP 106/80   Pulse 83   Temp (!) 97.2 F (36.2 C)   Ht 5\' 5"  (1.651 m)   Wt 217 lb (98.4 kg)   LMP  (LMP Unknown)   SpO2 95%   BMI 36.11 kg/m   BP/Weight 08/06/2019 0000000 [id number]  Systolic BP [id number]  Diastolic BP 80 72 76  Wt. (Lbs) 217 219 216  BMI 36.11 36.44 35.94    Physical Exam Vitals reviewed.  Constitutional:      General: She is not in acute distress.    Appearance: Normal appearance. She is obese.  HENT:     Right Ear: Tympanic membrane and ear canal normal.     Left Ear: Tympanic membrane and ear canal normal.     Nose: Nose normal. No congestion or rhinorrhea.  Eyes:     Conjunctiva/sclera: Conjunctivae normal.  Neck:     Thyroid: No thyroid mass.  Cardiovascular:     Rate and Rhythm: Normal rate and regular rhythm.     Pulses: Normal pulses.     Heart sounds: No murmur.  Pulmonary:     Effort: Pulmonary effort is normal.     Breath sounds: Normal breath sounds.  Abdominal:     General: Bowel sounds are normal.     Palpations: Abdomen is soft. There is no mass.     Tenderness: There is no abdominal tenderness.  Musculoskeletal:        General: Normal range of motion.  Lymphadenopathy:      Cervical: No cervical adenopathy.  Skin:    General: Skin is warm and dry.     Findings: Rash (left elbow and chest. Itchy. blistery. ) present.  Neurological:     Mental Status: She is alert and oriented to person, place, and time.     Cranial Nerves: No cranial nerve deficit.  Psychiatric:  Mood and Affect: Mood normal.        Behavior: Behavior normal.     Lab Results  Component Value Date   WBC 10.5 08/06/2019   HGB 14.2 08/06/2019   HCT 44.1 08/06/2019   PLT 377 08/06/2019   GLUCOSE 111 (H) 08/06/2019   CHOL 175 08/06/2019   TRIG 151 (H) 08/06/2019   HDL 44 08/06/2019   LDLCALC 104 (H) 08/06/2019   ALT 22 08/06/2019   AST 27 08/06/2019   NA 140 08/06/2019   K 5.3 (H) 08/06/2019   CL 104 08/06/2019   CREATININE 0.82 08/06/2019   BUN 15 08/06/2019   CO2 26 08/06/2019   TSH 1.760 08/06/2019   HGBA1C 6.1 (H) 08/06/2019      Assessment & Plan:  1. Essential hypertension, benign Well controlled.  No changes to medicines.  Continue to work on eating a healthy diet and exercise.  Labs drawn today.   2. Prediabetes - Well controlled.   Continue to work on eating a healthy diet and exercise.  Labs drawn today.   3. Mixed hyperlipidemia Well controlled.  No changes to medicines.  Continue to work on eating a healthy diet and exercise.  Labs drawn today.   4. Secondary hypothyroidism Well controlled.  The current medical regimen is effective;  continue present plan and medications.  5. Morbid obesity, bmi 35. Comorbidities  As above hyperlipidemia and hypothyroidism.   6. Poison Ivy Triamcinolone 80 mg Injection x 1.   Follow-up: Return in about 3 months (around 11/06/2019) for fasting with Dr. Tobie Poet. Annual [facility] Visit with Caitlin Mercury, LPN after [id number]).  AVS was given to patient prior to departure.  [facility] [phone]

## 2019-08-06 ENCOUNTER — Encounter: Payer: Self-pay | Admitting: Family Medicine

## 2019-08-06 ENCOUNTER — Ambulatory Visit (INDEPENDENT_AMBULATORY_CARE_PROVIDER_SITE_OTHER): Payer: Medicare HMO | Admitting: Family Medicine

## 2019-08-06 ENCOUNTER — Other Ambulatory Visit: Payer: Self-pay

## 2019-08-06 VITALS — BP 106/80 | HR 83 | Temp 97.2°F | Ht 65.0 in | Wt 217.0 lb

## 2019-08-06 DIAGNOSIS — E038 Other specified hypothyroidism: Secondary | ICD-10-CM | POA: Diagnosis not present

## 2019-08-06 DIAGNOSIS — R7303 Prediabetes: Secondary | ICD-10-CM | POA: Diagnosis not present

## 2019-08-06 DIAGNOSIS — L237 Allergic contact dermatitis due to plants, except food: Secondary | ICD-10-CM | POA: Insufficient documentation

## 2019-08-06 DIAGNOSIS — E782 Mixed hyperlipidemia: Secondary | ICD-10-CM | POA: Diagnosis not present

## 2019-08-06 DIAGNOSIS — I10 Essential (primary) hypertension: Secondary | ICD-10-CM

## 2019-08-06 MED ORDER — TRIAMCINOLONE ACETONIDE 40 MG/ML IJ SUSP
80.0000 mg | Freq: Once | INTRAMUSCULAR | Status: AC
  Administered 2019-08-06: 80 mg via INTRAMUSCULAR

## 2019-08-07 LAB — COMPREHENSIVE METABOLIC PANEL
ALT: 22 IU/L (ref 0–32)
AST: 27 IU/L (ref 0–40)
Albumin/Globulin Ratio: 1.6 (ref 1.2–2.2)
Albumin: 4.2 g/dL (ref 3.8–4.8)
Alkaline Phosphatase: 64 IU/L (ref 48–121)
BUN/Creatinine Ratio: 18 (ref 12–28)
BUN: 15 mg/dL (ref 8–27)
Bilirubin Total: 0.2 mg/dL (ref 0.0–1.2)
CO2: 26 mmol/L (ref 20–29)
Calcium: 9.8 mg/dL (ref 8.7–10.3)
Chloride: 104 mmol/L (ref 96–106)
Creatinine, Ser: 0.82 mg/dL (ref 0.57–1.00)
GFR calc Af Amer: 89 mL/min/{1.73_m2} (ref >59)
GFR calc non Af Amer: 77 mL/min/{1.73_m2} (ref >59)
Globulin, Total: 2.6 g/dL (ref 1.5–4.5)
Glucose: 111 mg/dL — ABNORMAL HIGH (ref 65–99)
Potassium: 5.3 mmol/L — ABNORMAL HIGH (ref 3.5–5.2)
Sodium: 140 mmol/L (ref 134–144)
Total Protein: 6.8 g/dL (ref 6.0–8.5)

## 2019-08-07 LAB — CBC WITH DIFFERENTIAL/PLATELET
Basophils Absolute: 0.2 10*3/uL (ref 0.0–0.2)
Basos: 1 %
EOS (ABSOLUTE): 0.4 10*3/uL (ref 0.0–0.4)
Eos: 4 %
Hematocrit: 44.1 % (ref 34.0–46.6)
Hemoglobin: 14.2 g/dL (ref 11.1–15.9)
Immature Grans (Abs): 0.1 10*3/uL (ref 0.0–0.1)
Immature Granulocytes: 1 %
Lymphocytes Absolute: 4.1 10*3/uL — ABNORMAL HIGH (ref 0.7–3.1)
Lymphs: 39 %
MCH: 28.9 pg (ref 26.6–33.0)
MCHC: 32.2 g/dL (ref 31.5–35.7)
MCV: 90 fL (ref 79–97)
Monocytes Absolute: 0.8 10*3/uL (ref 0.1–0.9)
Monocytes: 7 %
Neutrophils Absolute: 5 10*3/uL (ref 1.4–7.0)
Neutrophils: 48 %
Platelets: 377 10*3/uL (ref 150–450)
RBC: 4.91 x10E6/uL (ref 3.77–5.28)
RDW: 12.4 % (ref 11.7–15.4)
WBC: 10.5 10*3/uL (ref 3.4–10.8)

## 2019-08-07 LAB — HEMOGLOBIN A1C
Est. average glucose Bld gHb Est-mCnc: 128 mg/dL
Hgb A1c MFr Bld: 6.1 % — ABNORMAL HIGH (ref 4.8–5.6)

## 2019-08-07 LAB — LIPID PANEL
Chol/HDL Ratio: 4 ratio (ref 0.0–4.4)
Cholesterol, Total: 175 mg/dL (ref 100–199)
HDL: 44 mg/dL (ref >39)
LDL Chol Calc (NIH): 104 mg/dL — ABNORMAL HIGH (ref 0–99)
Triglycerides: 151 mg/dL — ABNORMAL HIGH (ref 0–149)
VLDL Cholesterol Cal: 27 mg/dL (ref 5–40)

## 2019-08-07 LAB — TSH: TSH: 1.76 u[IU]/mL (ref 0.450–4.500)

## 2019-08-07 LAB — CARDIOVASCULAR RISK ASSESSMENT

## 2019-08-21 ENCOUNTER — Other Ambulatory Visit: Payer: Self-pay | Admitting: Family Medicine

## 2019-08-30 ENCOUNTER — Other Ambulatory Visit: Payer: Self-pay

## 2019-08-30 MED ORDER — TRAMADOL HCL 50 MG PO TABS: 100.0000 mg | ORAL_TABLET | Freq: Three times a day (TID) | ORAL | 2 refills | Status: DC

## 2019-09-07 ENCOUNTER — Other Ambulatory Visit: Payer: Self-pay

## 2019-09-07 MED ORDER — GABAPENTIN 600 MG PO TABS: 600.0000 mg | ORAL_TABLET | Freq: Three times a day (TID) | ORAL | 0 refills | Status: DC

## 2019-09-30 ENCOUNTER — Other Ambulatory Visit: Payer: Self-pay | Admitting: Family Medicine

## 2019-09-30 ENCOUNTER — Other Ambulatory Visit: Payer: Self-pay | Admitting: Physician Assistant

## 2019-10-19 ENCOUNTER — Other Ambulatory Visit: Payer: Self-pay | Admitting: Physician Assistant

## 2019-10-24 ENCOUNTER — Other Ambulatory Visit: Payer: Self-pay | Admitting: Family Medicine

## 2019-10-25 DIAGNOSIS — E119 Type 2 diabetes mellitus without complications: Secondary | ICD-10-CM | POA: Diagnosis not present

## 2019-10-25 DIAGNOSIS — H2513 Age-related nuclear cataract, bilateral: Secondary | ICD-10-CM | POA: Diagnosis not present

## 2019-10-25 DIAGNOSIS — H53453 Other localized visual field defect, bilateral: Secondary | ICD-10-CM | POA: Diagnosis not present

## 2019-10-25 DIAGNOSIS — H524 Presbyopia: Secondary | ICD-10-CM | POA: Diagnosis not present

## 2019-10-27 ENCOUNTER — Other Ambulatory Visit: Payer: Self-pay | Admitting: Family Medicine

## 2019-11-11 ENCOUNTER — Ambulatory Visit: Payer: Medicare HMO | Admitting: Family Medicine

## 2019-11-12 ENCOUNTER — Other Ambulatory Visit: Payer: Self-pay | Admitting: Physician Assistant

## 2019-11-17 ENCOUNTER — Other Ambulatory Visit: Payer: Self-pay | Admitting: Family Medicine

## 2019-12-06 DIAGNOSIS — H524 Presbyopia: Secondary | ICD-10-CM | POA: Diagnosis not present

## 2019-12-07 ENCOUNTER — Other Ambulatory Visit: Payer: Self-pay | Admitting: Physician Assistant

## 2019-12-08 ENCOUNTER — Other Ambulatory Visit: Payer: Self-pay

## 2019-12-08 MED ORDER — TRAMADOL HCL 50 MG PO TABS
100.0000 mg | ORAL_TABLET | Freq: Three times a day (TID) | ORAL | 2 refills | Status: DC
Start: 2019-12-08 — End: 2020-03-29

## 2019-12-12 ENCOUNTER — Other Ambulatory Visit: Payer: Self-pay | Admitting: Physician Assistant

## 2019-12-12 ENCOUNTER — Other Ambulatory Visit: Payer: Self-pay | Admitting: Family Medicine

## 2020-01-06 ENCOUNTER — Other Ambulatory Visit: Payer: Self-pay | Admitting: Family Medicine

## 2020-01-10 ENCOUNTER — Other Ambulatory Visit: Payer: Self-pay | Admitting: Family Medicine

## 2020-01-12 DIAGNOSIS — Z6834 Body mass index (BMI) 34.0-34.9, adult: Secondary | ICD-10-CM | POA: Diagnosis not present

## 2020-01-12 DIAGNOSIS — M5416 Radiculopathy, lumbar region: Secondary | ICD-10-CM | POA: Diagnosis not present

## 2020-01-13 DIAGNOSIS — M5417 Radiculopathy, lumbosacral region: Secondary | ICD-10-CM | POA: Diagnosis not present

## 2020-01-13 DIAGNOSIS — M5117 Intervertebral disc disorders with radiculopathy, lumbosacral region: Secondary | ICD-10-CM | POA: Diagnosis not present

## 2020-01-15 ENCOUNTER — Other Ambulatory Visit: Payer: Self-pay | Admitting: Physician Assistant

## 2020-02-03 ENCOUNTER — Ambulatory Visit: Payer: Medicare HMO | Admitting: Nurse Practitioner

## 2020-02-04 ENCOUNTER — Ambulatory Visit: Payer: Medicare HMO | Admitting: Nurse Practitioner

## 2020-02-15 ENCOUNTER — Other Ambulatory Visit: Payer: Self-pay

## 2020-02-15 ENCOUNTER — Encounter: Payer: Self-pay | Admitting: Nurse Practitioner

## 2020-02-15 ENCOUNTER — Ambulatory Visit: Payer: Medicare HMO

## 2020-02-15 ENCOUNTER — Ambulatory Visit (INDEPENDENT_AMBULATORY_CARE_PROVIDER_SITE_OTHER): Payer: Medicare HMO | Admitting: Nurse Practitioner

## 2020-02-15 VITALS — BP 118/68 | HR 68 | Temp 97.6°F | Ht 65.0 in | Wt 209.0 lb

## 2020-02-15 DIAGNOSIS — Z0000 Encounter for general adult medical examination without abnormal findings: Secondary | ICD-10-CM

## 2020-02-15 DIAGNOSIS — Z23 Encounter for immunization: Secondary | ICD-10-CM | POA: Diagnosis not present

## 2020-02-15 NOTE — Patient Instructions (Addendum)
Return medical advance directive copy to office Continue home medications Follow-up with Dr Tobie Poet 02/22/20 at 09:00    Health Maintenance, Female Adopting a healthy lifestyle and getting preventive care are important in promoting health and wellness. Ask your health care provider about:  The right schedule for you to have regular tests and exams.  Things you can do on your own to prevent diseases and keep yourself healthy. What should I know about diet, weight, and exercise? Eat a healthy diet   Eat a diet that includes plenty of vegetables, fruits, low-fat dairy products, and lean protein.  Do not eat a lot of foods that are high in solid fats, added sugars, or sodium. Maintain a healthy weight Body mass index (BMI) is used to identify weight problems. It estimates body fat based on height and weight. Your health care provider can help determine your BMI and help you achieve or maintain a healthy weight. Get regular exercise Get regular exercise. This is one of the most important things you can do for your health. Most adults should:  Exercise for at least 150 minutes each week. The exercise should increase your heart rate and make you sweat (moderate-intensity exercise).  Do strengthening exercises at least twice a week. This is in addition to the moderate-intensity exercise.  Spend less time sitting. Even light physical activity can be beneficial. Watch cholesterol and blood lipids Have your blood tested for lipids and cholesterol at 62 years of age, then have this test every 5 years. Have your cholesterol levels checked more often if:  Your lipid or cholesterol levels are high.  You are older than 62 years of age.  You are at high risk for heart disease. What should I know about cancer screening? Depending on your health history and family history, you may need to have cancer screening at various ages. This may include screening for:  Breast cancer.  Cervical  cancer.  Colorectal cancer.  Skin cancer.  Lung cancer. What should I know about heart disease, diabetes, and high blood pressure? Blood pressure and heart disease  High blood pressure causes heart disease and increases the risk of stroke. This is more likely to develop in people who have high blood pressure readings, are of African descent, or are overweight.  Have your blood pressure checked: ? Every 3-5 years if you are 25-64 years of age. ? Every year if you are 56 years old or older. Diabetes Have regular diabetes screenings. This checks your fasting blood sugar level. Have the screening done:  Once every three years after age 42 if you are at a normal weight and have a low risk for diabetes.  More often and at a younger age if you are overweight or have a high risk for diabetes. What should I know about preventing infection? Hepatitis B If you have a higher risk for hepatitis B, you should be screened for this virus. Talk with your health care provider to find out if you are at risk for hepatitis B infection. Hepatitis C Testing is recommended for:  Everyone born from [birth year] through 1963.  Anyone with known risk factors for hepatitis C. Sexually transmitted infections (STIs)  Get screened for STIs, including gonorrhea and chlamydia, if: ? You are sexually active and are younger than 62 years of age. ? You are older than 62 years of age and your health care provider tells you that you are at risk for this type of infection. ? Your sexual activity has changed since  you were last screened, and you are at increased risk for chlamydia or gonorrhea. Ask your health care provider if you are at risk.  Ask your health care provider about whether you are at high risk for HIV. Your health care provider may recommend a prescription medicine to help prevent HIV infection. If you choose to take medicine to prevent HIV, you should first get tested for HIV. You should then be tested every 3  months for as long as you are taking the medicine. Pregnancy  If you are about to stop having your period (premenopausal) and you may become pregnant, seek counseling before you get pregnant.  Take 400 to 800 micrograms (mcg) of folic acid every day if you become pregnant.  Ask for birth control (contraception) if you want to prevent pregnancy. Osteoporosis and menopause Osteoporosis is a disease in which the bones lose minerals and strength with aging. This can result in bone fractures. If you are 58 years old or older, or if you are at risk for osteoporosis and fractures, ask your health care provider if you should:  Be screened for bone loss.  Take a calcium or vitamin D supplement to lower your risk of fractures.  Be given hormone replacement therapy (HRT) to treat symptoms of menopause. Follow these instructions at home: Lifestyle  Do not use any products that contain nicotine or tobacco, such as cigarettes, e-cigarettes, and chewing tobacco. If you need help quitting, ask your health care provider.  Do not use street drugs.  Do not share needles.  Ask your health care provider for help if you need support or information about quitting drugs. Alcohol use  Do not drink alcohol if: ? Your health care provider tells you not to drink. ? You are pregnant, may be pregnant, or are planning to become pregnant.  If you drink alcohol: ? Limit how much you use to 0-1 drink a day. ? Limit intake if you are breastfeeding.  Be aware of how much alcohol is in your drink. In the [place] one drink equals one 12 oz bottle of beer (355 mL), one 5 oz glass of wine (148 mL), or one 1 oz glass of hard liquor (44 mL). General instructions  Schedule regular health, dental, and eye exams.  Stay current with your vaccines.  Tell your health care provider if: ? You often feel depressed. ? You have ever been abused or do not feel safe at home. Summary  Adopting a healthy lifestyle and getting  preventive care are important in promoting health and wellness.  Follow your health care provider's instructions about healthy diet, exercising, and getting tested or screened for diseases.  Follow your health care provider's instructions on monitoring your cholesterol and blood pressure. This information is not intended to replace advice given to you by your health care provider. Make sure you discuss any questions you have with your health care provider. Document Revised: 02/11/2018 Document Reviewed: 02/11/2018 Elsevier Patient Education  2020 [employer] Preventive Care 71-44 Years Old, Female Preventive care refers to visits with your health care provider and lifestyle choices that can promote health and wellness. This includes:  A yearly physical exam. This may also be called an annual well check.  Regular dental visits and eye exams.  Immunizations.  Screening for certain conditions.  Healthy lifestyle choices, such as eating a healthy diet, getting regular exercise, not using drugs or products that contain nicotine and tobacco, and limiting alcohol use. What can I expect for my preventive  care visit? Physical exam Your health care provider will check your:  Height and weight. This may be used to calculate body mass index (BMI), which tells if you are at a healthy weight.  Heart rate and blood pressure.  Skin for abnormal spots. Counseling Your health care provider may ask you questions about your:  Alcohol, tobacco, and drug use.  Emotional well-being.  Home and relationship well-being.  Sexual activity.  Eating habits.  Work and work Statistician.  Method of birth control.  Menstrual cycle.  Pregnancy history. What immunizations do I need?  Influenza (flu) vaccine  This is recommended every year. Tetanus, diphtheria, and pertussis (Tdap) vaccine  You may need a Td booster every 10 years. Varicella (chickenpox) vaccine  You may need this if you  have not been vaccinated. Zoster (shingles) vaccine  You may need this after age 71. Measles, mumps, and rubella (MMR) vaccine  You may need at least one dose of MMR if you were born in 1957 or later. You may also need a second dose. Pneumococcal conjugate (PCV13) vaccine  You may need this if you have certain conditions and were not previously vaccinated. Pneumococcal polysaccharide (PPSV23) vaccine  You may need one or two doses if you smoke cigarettes or if you have certain conditions. Meningococcal conjugate (MenACWY) vaccine  You may need this if you have certain conditions. Hepatitis A vaccine  You may need this if you have certain conditions or if you travel or work in places where you may be exposed to hepatitis A. Hepatitis B vaccine  You may need this if you have certain conditions or if you travel or work in places where you may be exposed to hepatitis B. Haemophilus influenzae type b (Hib) vaccine  You may need this if you have certain conditions. Human papillomavirus (HPV) vaccine  If recommended by your health care provider, you may need three doses over 6 months. You may receive vaccines as individual doses or as more than one vaccine together in one shot (combination vaccines). Talk with your health care provider about the risks and benefits of combination vaccines. What tests do I need? Blood tests  Lipid and cholesterol levels. These may be checked every 5 years, or more frequently if you are over 1 years old.  Hepatitis C test.  Hepatitis B test. Screening  Lung cancer screening. You may have this screening every year starting at age 62 if you have a 30-pack-year history of smoking and currently smoke or have quit within the past 15 years.  Colorectal cancer screening. All adults should have this screening starting at age 58 and continuing until age 20. Your health care provider may recommend screening at age 79 if you are at increased risk. You will have  tests every 1-10 years, depending on your results and the type of screening test.  Diabetes screening. This is done by checking your blood sugar (glucose) after you have not eaten for a while (fasting). You may have this done every 1-3 years.  Mammogram. This may be done every 1-2 years. Talk with your health care provider about when you should start having regular mammograms. This may depend on whether you have a family history of breast cancer.  BRCA-related cancer screening. This may be done if you have a family history of breast, ovarian, tubal, or peritoneal cancers.  Pelvic exam and Pap test. This may be done every 3 years starting at age 21. Starting at age 67, this may be done every 5  years if you have a Pap test in combination with an HPV test. Other tests  Sexually transmitted disease (STD) testing.  Bone density scan. This is done to screen for osteoporosis. You may have this scan if you are at high risk for osteoporosis. Follow these instructions at home: Eating and drinking  Eat a diet that includes fresh fruits and vegetables, whole grains, lean protein, and low-fat dairy.  Take vitamin and mineral supplements as recommended by your health care provider.  Do not drink alcohol if: ? Your health care provider tells you not to drink. ? You are pregnant, may be pregnant, or are planning to become pregnant.  If you drink alcohol: ? Limit how much you have to 0-1 drink a day. ? Be aware of how much alcohol is in your drink. In the [place] one drink equals one 12 oz bottle of beer (355 mL), one 5 oz glass of wine (148 mL), or one 1 oz glass of hard liquor (44 mL). Lifestyle  Take daily care of your teeth and gums.  Stay active. Exercise for at least 30 minutes on 5 or more days each week.  Do not use any products that contain nicotine or tobacco, such as cigarettes, e-cigarettes, and chewing tobacco. If you need help quitting, ask your health care provider.  If you are  sexually active, practice safe sex. Use a condom or other form of birth control (contraception) in order to prevent pregnancy and STIs (sexually transmitted infections).  If told by your health care provider, take low-dose aspirin daily starting at age 31. What's next?  Visit your health care provider once a year for a well check visit.  Ask your health care provider how often you should have your eyes and teeth checked.  Stay up to date on all vaccines. This information is not intended to replace advice given to you by your health care provider. Make sure you discuss any questions you have with your health care provider. Document Revised: 10/30/2017 Document Reviewed: 10/30/2017 Elsevier Patient Education  2020 [place] and Fractures  Falls can be very serious, especially for older adults or people with osteoporosis  Falls can be caused by:  Tripping or slipping  Slow reflexes  Balance problems  Reduced muscle strength  Poor vision or a recent change in prescription  Illness and some medications (especially blood pressure pills, diuretics, heart medicines, muscle relaxants and sleep medications)  Drinking alcohol  To prevent falls outdoors:  Use a can or walker if needed  Wear rubber-soled shoes so you don't slip  DO NOT buy "shape up" shoes with rocker bottom soles if you have balance problems.  The thick soles and shape make it more difficult to keep your balance.  Put kitty litter or salt on icy sidewalks  Walk on the grass if the sidewalks are slick  Avoid walking on uneven ground whenever possible  T prevent falls indoors:  Keep rooms clutter-free, especially hallways, stairs and paths to light switches  Remove throw rugs  Install night lights, especially to and in the bathroom  Turn on lights before going downstairs  Keep a flashlight next to your bed  Buy a cordless phone to keep with you instead of jumping up to answer the  phone  Install grab bars in the bathroom near the shower and toilet  Install rails on both sides of the stairs.  Make sure the stairs are well lit  Wear slippers with non-skid soles.  Do not  walk around in stockings or socks  Balance problems and dizziness are not a normal part of growing older.  If you begin having balance problems or dizziness see your doctor.  Physical Therapy can help you with many balance problems, strengthening hip and leg muscles and with gait training.  To keep your bones healthy make sure you are getting enough calcium and Vitamin D each day.  Ask your doctor or pharmacist about supplements.  Regular weight-bearing exercise like walking, lifting weights or dancing can help strengthen bones and prevent osteoporosis. It is important to avoid accidents which may result in broken bones.  Here are a few ideas on how to make your home safer so you will be less likely to trip or fall.  10. Use nonskid mats or non slip strips in your shower or tub, on your bathroom floor and around sinks.  If you know that you have spilled water, wipe it up! 11. In the bathroom, it is important to have properly installed grab bars on the walls or on the edge of the tub.  Towel racks are NOT strong enough for you to hold onto or to pull on for support. 12. Stairs and hallways should have enough light.  Add lamps or night lights if you need ore light. 13. It is good to have handrails on both sides of the stairs if possible.  Always fix broken handrails right away. 14. It is important to see the edges of steps.  Paint the edges of outdoor steps white so you can see them better.  Put colored tape on the edge of inside steps. 15. Throw-rugs are dangerous because they can slide.  Removing the rugs is the best idea, but if they must stay, add adhesive carpet tape to prevent slipping. 16. Do not keep things on stairs or in the halls.  Remove small furniture that blocks the halls as it may cause you to  trip.  Keep telephone and electrical cords out of the way where you walk. 17. Always were sturdy, rubber-soled shoes for good support.  Never wear just socks, especially on the stairs.  Socks may cause you to slip or fall.  Do not wear full-length housecoats as you can easily trip on the bottom.  18. Place the things you use the most on the shelves that are the easiest to reach.  If you use a stepstool, make sure it is in good condition.  If you feel unsteady, DO NOT climb, ask for help. If a health professional advises you to use a cane or walker, do not be ashamed.  These items can keep you from falling and breaking your bones. Preventing Influenza, Adult Influenza, more commonly known as the flu, is a viral infection that mainly affects the respiratory tract. The respiratory tract includes structures that help you breathe, such as the lungs, nose, and throat. The flu causes many common cold symptoms, as well as a high fever and body aches. The flu spreads easily from person to person (is contagious). The flu is most common from December through March. This is called flu season.You can catch the flu virus by: Breathing in droplets from an infected person's cough or sneeze. Touching something that was recently contaminated with the virus and then touching your mouth, nose, or eyes. What can I do to lower my risk?        You can decrease your risk of getting the flu by: Getting a flu shot (influenza vaccination) every year. This is  the best way to prevent the flu. A flu shot is recommended for everyone age 96 months and older. It is best to get a flu shot in the fall, as soon as it is available. Getting a flu shot during winter or spring instead is still a good idea. Flu season can last into early spring. Preventing the flu through vaccination requires getting a new flu shot every year. This is because the flu virus changes slightly (mutates) from one year to the next. Even if a flu shot does not  completely protect you from all flu virus mutations, it can reduce the severity of your illness and prevent dangerous complications of the flu. If you are pregnant, you can and should get a flu shot. If you have had a reaction to the shot in the past or if you are allergic to eggs, check with your health care provider before getting a flu shot. Sometimes the vaccine is available as a nasal spray. In some years, the nasal spray has not been as effective against the flu virus. Check with your health care provider if you have questions about this. Practicing good health habits. This is especially important during flu season. Avoid contact with people who are sick with flu or cold symptoms. Wash your hands with soap and water often. If soap and water are not available, use alcohol-based hand sanitizer. Avoid touching your hands to your face, especially when you have not washed your hands recently. Use a disinfectant to clean surfaces at home and at work that may be contaminated with the flu virus. Keep your bodys disease-fighting system (immune system) in good shape by eating a healthy diet, drinking plenty of fluids, getting enough sleep, and exercising regularly. If you do get the flu, avoid spreading it to others by: Staying home until your symptoms have been gone for at least one day. Covering your mouth and nose when you cough or sneeze. Avoiding close contact with others, especially babies and elderly people. Why are these changes important? Getting a flu shot and practicing good health habits protects you as well as other people. If you get the flu, your friends, family, and co-workers are also at risk of getting it, because it spreads so easily to others. Each year, about 2 out of every 10 people get the flu. Having the flu can lead to complications, such as pneumonia, ear infection, and sinus infection. The flu also can be deadly, especially for babies, people older than age 76, and people who  have serious long-term diseases. How is this treated? Most people recover from the flu by resting at home and drinking plenty of fluids. However, a prescription antiviral medicine may reduce your flu symptoms and may make your flu go away sooner. This medicine must be started within a few days of getting flu symptoms. You can talk with your health care provider about whether you need an antiviral medicine. Antiviral medicine may be prescribed for people who are at risk for more serious flu symptoms. This includes people who: Are older than age 65. Are pregnant. Have a condition that makes the flu worse or more dangerous. Where to find more information Centers for Disease Control and Prevention: http://www.smith-bell.org/ [employer].ee: [employer]ure[employer].com American Academy [employer]: familydoctor.org/familydoctor/en/kids/vaccines/preventing-the-flu.html Contact a health care provider if: You have influenza and you develop new symptoms. You have: Chest pain. Diarrhea. A fever. Your cough gets worse, or you produce more mucus. Summary The best way to prevent the flu is to get a  flu shot every year in the fall. Even if you get the flu after you have received the yearly vaccine, your flu may be milder and go away sooner because of your flu shot. If you get the flu, antiviral medicines that are started with a few days of symptoms may reduce your flu symptoms and may make your flu go away sooner. You can also help prevent the flu by practicing good health habits. This information is not intended to replace advice given to you by your health care provider. Make sure you discuss any questions you have with your health care provider. Document Revised: 01/31/2017 Document Reviewed: 10/28/2015 Elsevier Patient Education  [employer]. 19.

## 2020-02-15 NOTE — Progress Notes (Addendum)
Subjective:  Patient ID: Caitlin Ho, female    DOB: Mar 17, 1955  Age: 64 y.o. MRN: [id number]  Chief Complaint  Patient presents with  . Annual Exam    Medicare Annual Wellness    HPI [name] is a 64 year old Caucasian female present for Medicare Annual Wellness exam. She has no medical complaints today. She has received seasonal flu vaccine and COVID19 immunizations x 2 and booster. She is up-to-date on pneumonia vaccines, TDAP, and screening mammogram. She is not up-to-date on screening colonoscopy or Cologuard. She has completed advanced care directive packet. Copy requested for medical chart. Physical ("At Risk" items are starred): Patient's last physical exam was 1 year ago .  Smoking: Life-long non-smoker ;  Physical Activity: She does not exercise regularly. She is physically active with house and yard chores. She has chronic back pain that limits physical activity. Alcohol/Drug Use: Is a non-drinker ; No illicit drug use ;  Patient is not afflicted from Stress Incontinence and Urge Incontinence  Safety: reviewed ; Patient wears a seat belt, has smoke detectors, has carbon monoxide detectors, practices appropriate gun safety, and wears sunscreen with extended sun exposure. Dental Care: biannual cleanings, brushes and flosses daily. Ophthalmology/Optometry: Annual visit.  Hearing loss: none Vision impairments: Wears glasses  Mammogram: March 2021-normal   Fall Risk  02/16/2018  Falls in the past year? 0     Depression screen PHQ 2/9 05/24/2019  Decreased Interest 0  Down, Depressed, Hopeless 0  PHQ - 2 Score 0            Social Hx   Social History   Socioeconomic History  . Marital status: Married    Spouse name: Not on file  . Number of children: 1  . Years of education: Not on file  . Highest education level: Not on file  Occupational History  . Not on file  Tobacco Use  . Smoking status: Never Smoker  . Smokeless tobacco: Never Used  Vaping Use  . Vaping  Use: Never used  Substance and Sexual Activity  . Alcohol use: No  . Drug use: No  . Sexual activity: Not Currently    Birth control/protection: Abstinence  Other Topics Concern  . Not on file  Social History Narrative  . Not on file   Social Determinants of Health   Financial Resource Strain: Not on file  Food Insecurity: Not on file  Transportation Needs: Not on file  Physical Activity: Not on file  Stress: Not on file  Social Connections: Not on file   Past Medical History:  Diagnosis Date  . Cancer ([place])    cervical cancer cells whwn patient was 22   . Cervicalgia   . GERD (gastroesophageal reflux disease)    not on medication  . Hashimoto's disease   . Headache(784.0)    sinus  . Homonymous hemianopia   . Hyperlipidemia   . Hypertension   . Hypothyroid   . Other insomnia   . Other sequelae of cerebral infarction   . Palpitations   . PONV (postoperative nausea and vomiting)    06/20/14 patient reported that BP dropped real low and she had N/V  and was slow to awaken when she had surgery at an outpatient center.  . Prediabetes   . Seasonal allergies   . Stroke ([place])   . Supraventricular tachycardia ([place])   . Vitamin D deficiency    Family History  Problem Relation Age of Onset  . Aneurysm Mother  brain  . Heart attack Father 75  . Cancer Paternal Grandmother        Breast  . Hyperlipidemia Other   . Hypertension Other   . Diabetes Other     Review of Systems  Constitutional: Negative for fatigue and fever.  HENT: Negative for congestion, ear pain, sinus pressure and sore throat.   Eyes: Negative for pain.  Respiratory: Negative for cough, chest tightness, shortness of breath and wheezing.   Cardiovascular: Negative for chest pain and palpitations.  Gastrointestinal: Negative for abdominal pain, constipation, diarrhea, nausea and vomiting.  Genitourinary: Negative for dysuria and hematuria.  Musculoskeletal: Positive for arthralgias and back  pain (chronic back and neck pain). Negative for joint swelling and myalgias.  Skin: Negative for rash.  Neurological: Negative for dizziness, weakness and headaches.  Psychiatric/Behavioral: Negative for dysphoric mood. The patient is not nervous/anxious.      Objective:  BP 118/68 (BP Location: Left Arm, Patient Position: Sitting)   Pulse 68   Temp 97.6 F (36.4 C) (Temporal)   Ht 5\' 5"  (1.651 m)   Wt 209 lb (94.8 kg)   LMP  (LMP Unknown)   SpO2 95%   BMI 34.78 kg/m   BP/Weight 02/15/2020 08/06/2019 3/81/0175  Systolic BP 102 585 277  Diastolic BP 68 80 72  Wt. (Lbs) 209 217 219  BMI 34.78 36.11 36.44    Physical Exam Vitals reviewed.  Constitutional:      Appearance: Normal appearance.  HENT:     Head: Normocephalic.     Nose: Nose normal.     Mouth/Throat:     Mouth: Mucous membranes are moist.  Cardiovascular:     Rate and Rhythm: Normal rate and regular rhythm.     Pulses: Normal pulses.     Heart sounds: Normal heart sounds.  Pulmonary:     Effort: Pulmonary effort is normal.     Breath sounds: Normal breath sounds.  Musculoskeletal:     Comments: Decreased ROM due to DDD  Skin:    General: Skin is warm and dry.     Capillary Refill: Capillary refill takes less than 2 seconds.  Neurological:     General: No focal deficit present.     Mental Status: She is alert and oriented to person, place, and time.  Psychiatric:        Mood and Affect: Mood normal.        Behavior: Behavior normal.        Thought Content: Thought content normal.        Judgment: Judgment normal.     Lab Results  Component Value Date   WBC 10.5 08/06/2019   HGB 14.2 08/06/2019   HCT 44.1 08/06/2019   PLT 377 08/06/2019   GLUCOSE 111 (H) 08/06/2019   CHOL 175 08/06/2019   TRIG 151 (H) 08/06/2019   HDL 44 08/06/2019   LDLCALC 104 (H) 08/06/2019   ALT 22 08/06/2019   AST 27 08/06/2019   NA 140 08/06/2019   K 5.3 (H) 08/06/2019   CL 104 08/06/2019   CREATININE 0.82  08/06/2019   BUN 15 08/06/2019   CO2 26 08/06/2019   TSH 1.760 08/06/2019   HGBA1C 6.1 (H) 08/06/2019      Assessment & Plan:  1. Encounter for annual wellness exam in Medicare patient  2. Need for vaccination - Flu Vaccine MDCK QUAD PF       These are the goals we discussed: Goals   Return advance directive packet to  office      This is a list of the screening recommended for you and due dates:  Health Maintenance  Topic Date Due  .  Hepatitis C: One time screening is recommended by Center for Disease Control  (CDC) for  adults born from [birth year] through 1963.   Never done  . Complete foot exam   Never done  . Eye exam for diabetics  Never done  . Urine Protein Check  Never done  . HIV Screening  Never done  . Colon Cancer Screening  Never done  . COVID-19 Vaccine (3 - Booster for Pfizer series) 11/25/2019  . Hemoglobin A1C  02/05/2020  . Mammogram  05/03/2020  . Tetanus Vaccine  01/13/2028  . Flu Shot  Completed  . Pneumococcal vaccine  Completed  . Pap Smear  Discontinued     AN INDIVIDUALIZED CARE PLAN: was established or reinforced today.   SELF MANAGEMENT: The patient and I together assessed ways to personally work towards obtaining the recommended goals  Support needs The patient and/or family needs were assessed and services were offered and not necessary at this time.    Follow-up: Has follow-up with Dr Tobie Poet Tuesday, December 21st at 9:00 fasting   Rip Harbour, NP [place] [phone]

## 2020-02-16 ENCOUNTER — Other Ambulatory Visit: Payer: Self-pay | Admitting: Family Medicine

## 2020-02-18 DIAGNOSIS — H5213 Myopia, bilateral: Secondary | ICD-10-CM | POA: Diagnosis not present

## 2020-02-22 ENCOUNTER — Other Ambulatory Visit: Payer: Self-pay

## 2020-02-22 ENCOUNTER — Ambulatory Visit (INDEPENDENT_AMBULATORY_CARE_PROVIDER_SITE_OTHER): Payer: Medicare HMO | Admitting: Family Medicine

## 2020-02-22 ENCOUNTER — Encounter: Payer: Self-pay | Admitting: Family Medicine

## 2020-02-22 VITALS — BP 118/80 | HR 74 | Temp 97.3°F | Resp 16 | Ht 65.0 in | Wt 206.0 lb

## 2020-02-22 DIAGNOSIS — I10 Essential (primary) hypertension: Secondary | ICD-10-CM

## 2020-02-22 DIAGNOSIS — Z6834 Body mass index (BMI) 34.0-34.9, adult: Secondary | ICD-10-CM | POA: Diagnosis not present

## 2020-02-22 DIAGNOSIS — E6609 Other obesity due to excess calories: Secondary | ICD-10-CM | POA: Diagnosis not present

## 2020-02-22 DIAGNOSIS — E782 Mixed hyperlipidemia: Secondary | ICD-10-CM

## 2020-02-22 DIAGNOSIS — M545 Low back pain, unspecified: Secondary | ICD-10-CM | POA: Diagnosis not present

## 2020-02-22 DIAGNOSIS — G8929 Other chronic pain: Secondary | ICD-10-CM | POA: Diagnosis not present

## 2020-02-22 DIAGNOSIS — R7303 Prediabetes: Secondary | ICD-10-CM | POA: Diagnosis not present

## 2020-02-22 DIAGNOSIS — E038 Other specified hypothyroidism: Secondary | ICD-10-CM

## 2020-02-22 NOTE — Progress Notes (Signed)
Subjective:  Patient ID: Caitlin Ho, female    DOB: 1955/06/11  Age: 64 y.o. MRN: [id number]  Chief Complaint  Patient presents with  . Hypertension  . Hyperlipidemia  . Hypothyroidism   HPI: Hypertension: Patient is not checking her blood pressure at home. She is taking metoprolol, aspirin and furosemide. Hyperlipidemia: Patient takes fenofibrate. Hypothyroidism; Patient takes levothyroxine 125 mcg. Prediabetes: eating health, but not exercising.  Chronic back pain which has worsened in the last couple of months. ESI given November which did not help. Robaxin 500 mg one three times a day, gabapentin 600 mg one three times a day, tramadol 50 mg 2 tablets three times a day.  Vitamin D taking Vitamin D 50000 twice a week.  GERD: omeprazole helps.   Current Outpatient Medications on File Prior to Visit  Medication Sig Dispense Refill  . aspirin EC 81 MG tablet Take 81 mg by mouth daily.    . fenofibrate 160 MG tablet TAKE 1 TABLET EVERY DAY 90 tablet 2  . furosemide (LASIX) 40 MG tablet TAKE 1 TABLET EVERY DAY 90 tablet 0  . gabapentin (NEURONTIN) 600 MG tablet TAKE 1 TABLET THREE TIMES DAILY 270 tablet 0  . levothyroxine (SYNTHROID) 125 MCG tablet TAKE 1 TABLET EVERY DAY BEFORE BREAKFAST 90 tablet 1  . methocarbamol (ROBAXIN) 500 MG tablet TAKE 1 TABLET THREE TIMES DAILY 270 tablet 0  . metoprolol tartrate (LOPRESSOR) 25 MG tablet TAKE 1 TABLET TWICE DAILY 180 tablet 1  . omeprazole (PRILOSEC) 40 MG capsule TAKE 1 CAPSULE EVERY DAY 90 capsule 0  . traMADol (ULTRAM) 50 MG tablet Take 2 tablets (100 mg total) by mouth in the morning, at noon, and at bedtime. 180 tablet 2  . Vitamin D, Ergocalciferol, (DRISDOL) 1.25 MG (50000 UNIT) CAPS capsule TAKE 1 CAPSULE BY MOUTH TWICE WEEKLY 24 capsule 1   No current facility-administered medications on file prior to visit.   Past Medical History:  Diagnosis Date  . Cancer ([place])    cervical cancer cells whwn patient was 22   . Cervicalgia    . GERD (gastroesophageal reflux disease)    not on medication  . Hashimoto's disease   . Headache(784.0)    sinus  . Homonymous hemianopia   . Hyperlipidemia   . Hypertension   . Hypothyroid   . Other insomnia   . Other sequelae of cerebral infarction   . Palpitations   . PONV (postoperative nausea and vomiting)    06/20/14 patient reported that BP dropped real low and she had N/V  and was slow to awaken when she had surgery at an outpatient center.  . Prediabetes   . Seasonal allergies   . Stroke ([place])   . Supraventricular tachycardia ([place])   . Vitamin D deficiency    Past Surgical History:  Procedure Laterality Date  . ABDOMINAL HYSTERECTOMY    . APPENDECTOMY  1978  . CERVICAL FUSION  2017   2 levels  . LUMBAR DISC SURGERY  10/03/2012   Dr Ellene Route  2 Discectomy  . [place] SURGERY  07/2012  . LUMBAR FUSION  2015   Fusion (2 levels)  . LUMBAR FUSION  2016   fusion 1 level.  . LUMBAR FUSION  02/16/2013   Fusion lumbar.  . THYROIDECTOMY  2006  . THYROIDECTOMY  2006    Family History  Problem Relation Age of Onset  . Aneurysm Mother        brain  . Heart attack Father 67  .  Cancer Paternal Grandmother        Breast  . Hyperlipidemia Other   . Hypertension Other   . Diabetes Other    Social History   Socioeconomic History  . Marital status: Married    Spouse name: Not on file  . Number of children: 1  . Years of education: Not on file  . Highest education level: Not on file  Occupational History  . Occupation: Disabled  Tobacco Use  . Smoking status: Never Smoker  . Smokeless tobacco: Never Used  Vaping Use  . Vaping Use: Never used  Substance and Sexual Activity  . Alcohol use: No  . Drug use: No  . Sexual activity: Yes    Partners: Male    Birth control/protection: None  Other Topics Concern  . Not on file  Social History Narrative  . Not on file   Social Determinants of Health   Financial Resource Strain: Low Risk   . Difficulty of  Paying Living Expenses: Not hard at all  Food Insecurity: No Food Insecurity  . Worried About Charity fundraiser in the Last Year: Never true  . Ran Out of Food in the Last Year: Never true  Transportation Needs: No Transportation Needs  . Lack of Transportation (Medical): No  . Lack of Transportation (Non-Medical): No  Physical Activity: Inactive  . Days of Exercise per Week: 0 days  . Minutes of Exercise per Session: 0 min  Stress: No Stress Concern Present  . Feeling of Stress : Not at all  Social Connections: Socially Integrated  . Frequency of Communication with Friends and Family: More than three times a week  . Frequency of Social Gatherings with Friends and Family: Once a week  . Attends Religious Services: More than 4 times per year  . Active Member of Clubs or Organizations: Yes  . Attends Archivist Meetings: More than 4 times per year  . Marital Status: Married    Review of Systems  Constitutional: Negative for chills, fatigue and fever.  HENT: Negative for ear pain and rhinorrhea.   Eyes: Negative for pain and discharge.  Respiratory: Negative for apnea, cough, chest tightness and shortness of breath.   Cardiovascular: Negative for chest pain.  Gastrointestinal: Negative for abdominal pain, constipation, diarrhea, nausea and vomiting.  Endocrine: Negative for polydipsia, polyphagia and polyuria.  Genitourinary: Negative for dysuria.  Musculoskeletal: Positive for arthralgias and back pain.  Skin: Negative for rash.  Neurological: Positive for headaches.     Objective:  BP 118/80   Pulse 74   Temp (!) 97.3 F (36.3 C)   Resp 16   Ht 5\' 5"  (1.651 m)   Wt 206 lb (93.4 kg)   LMP  (LMP Unknown)   SpO2 98%   BMI 34.28 kg/m   BP/Weight 02/22/2020 23/76/2831 07/03/7614  Systolic BP 073 710 626  Diastolic BP 80 68 80  Wt. (Lbs) 206 209 217  BMI 34.28 34.78 36.11    Physical Exam Vitals reviewed.  Constitutional:      Appearance: Normal  appearance. She is normal weight.  Neck:     Vascular: No carotid bruit.  Cardiovascular:     Rate and Rhythm: Normal rate and regular rhythm.     Pulses: Normal pulses.     Heart sounds: Normal heart sounds.  Pulmonary:     Effort: Pulmonary effort is normal. No respiratory distress.     Breath sounds: Normal breath sounds.  Abdominal:     General:  Abdomen is flat. Bowel sounds are normal.     Palpations: Abdomen is soft.     Tenderness: There is no abdominal tenderness.  Musculoskeletal:        General: Tenderness (LUMBAR) present.  Neurological:     Mental Status: She is alert and oriented to person, place, and time.  Psychiatric:        Mood and Affect: Mood normal.        Behavior: Behavior normal.     Lab Results  Component Value Date   WBC 10.5 08/06/2019   HGB 14.2 08/06/2019   HCT 44.1 08/06/2019   PLT 377 08/06/2019   GLUCOSE 111 (H) 08/06/2019   CHOL 175 08/06/2019   TRIG 151 (H) 08/06/2019   HDL 44 08/06/2019   LDLCALC 104 (H) 08/06/2019   ALT 22 08/06/2019   AST 27 08/06/2019   NA 140 08/06/2019   K 5.3 (H) 08/06/2019   CL 104 08/06/2019   CREATININE 0.82 08/06/2019   BUN 15 08/06/2019   CO2 26 08/06/2019   TSH 1.760 08/06/2019   HGBA1C 6.1 (H) 08/06/2019      Assessment & Plan:   1. Essential hypertension, benign Well controlled.  No changes to medicines.  Continue to work on eating a healthy diet and exercise.  Labs drawn today.  - Comprehensive metabolic panel - CBC with Differential/Platelet  2. Prediabetes Low sugar diet.  - Hemoglobin A1c  3. Mixed hyperlipidemia Await labs to determine if changes appropriate.  Continue to work on eating a healthy diet and exercise.  Labs drawn today.  - Lipid panel  4. Secondary hypothyroidism - TSH  5. Chronic midline low back pain without sciatica Worsening.  Keep follow up with back doctor.   6. Class 1 obesity due to excess calories with serious comorbidity and body mass index (BMI) of  34.0 to 34.9 in adult  Recommend continue to work on eating healthy diet and exercise.   Orders Placed This Encounter  Procedures  . HM MAMMOGRAPHY  . Comprehensive metabolic panel  . Hemoglobin A1c  . Lipid panel  . TSH  . CBC with Differential/Platelet     Follow-up: Return in about 3 months (around 05/22/2020) for fasting.  An After Visit Summary was printed and given to the patient.  Rochel Brome, MD [facility] [phone]

## 2020-02-23 LAB — CBC WITH DIFFERENTIAL/PLATELET
Basophils Absolute: 0.1 10*3/uL (ref 0.0–0.2)
Basos: 1 %
EOS (ABSOLUTE): 0.3 10*3/uL (ref 0.0–0.4)
Eos: 2 %
Hematocrit: 43.9 % (ref 34.0–46.6)
Hemoglobin: 14.7 g/dL (ref 11.1–15.9)
Immature Grans (Abs): 0.1 10*3/uL (ref 0.0–0.1)
Immature Granulocytes: 1 %
Lymphocytes Absolute: 3.8 10*3/uL — ABNORMAL HIGH (ref 0.7–3.1)
Lymphs: 34 %
MCH: 29.8 pg (ref 26.6–33.0)
MCHC: 33.5 g/dL (ref 31.5–35.7)
MCV: 89 fL (ref 79–97)
Monocytes Absolute: 0.8 10*3/uL (ref 0.1–0.9)
Monocytes: 7 %
Neutrophils Absolute: 6.2 10*3/uL (ref 1.4–7.0)
Neutrophils: 55 %
Platelets: 471 10*3/uL — ABNORMAL HIGH (ref 150–450)
RBC: 4.93 x10E6/uL (ref 3.77–5.28)
RDW: 12.1 % (ref 11.7–15.4)
WBC: 11.3 10*3/uL — ABNORMAL HIGH (ref 3.4–10.8)

## 2020-02-23 LAB — HEMOGLOBIN A1C
Est. average glucose Bld gHb Est-mCnc: 126 mg/dL
Hgb A1c MFr Bld: 6 % — ABNORMAL HIGH (ref 4.8–5.6)

## 2020-02-23 LAB — COMPREHENSIVE METABOLIC PANEL
ALT: 14 IU/L (ref 0–32)
AST: 16 IU/L (ref 0–40)
Albumin/Globulin Ratio: 1.7 (ref 1.2–2.2)
Albumin: 4.2 g/dL (ref 3.8–4.8)
Alkaline Phosphatase: 55 IU/L (ref 44–121)
BUN/Creatinine Ratio: 21 (ref 12–28)
BUN: 21 mg/dL (ref 8–27)
Bilirubin Total: 0.3 mg/dL (ref 0.0–1.2)
CO2: 27 mmol/L (ref 20–29)
Calcium: 9.6 mg/dL (ref 8.7–10.3)
Chloride: 103 mmol/L (ref 96–106)
Creatinine, Ser: 0.98 mg/dL (ref 0.57–1.00)
GFR calc Af Amer: 71 mL/min/{1.73_m2} (ref >59)
GFR calc non Af Amer: 62 mL/min/{1.73_m2} (ref >59)
Globulin, Total: 2.5 g/dL (ref 1.5–4.5)
Glucose: 92 mg/dL (ref 65–99)
Potassium: 4.8 mmol/L (ref 3.5–5.2)
Sodium: 142 mmol/L (ref 134–144)
Total Protein: 6.7 g/dL (ref 6.0–8.5)

## 2020-02-23 LAB — TSH: TSH: 0.654 u[IU]/mL (ref 0.450–4.500)

## 2020-02-23 LAB — LIPID PANEL
Chol/HDL Ratio: 3.4 ratio (ref 0.0–4.4)
Cholesterol, Total: 174 mg/dL (ref 100–199)
HDL: 51 mg/dL (ref >39)
LDL Chol Calc (NIH): 105 mg/dL — ABNORMAL HIGH (ref 0–99)
Triglycerides: 99 mg/dL (ref 0–149)
VLDL Cholesterol Cal: 18 mg/dL (ref 5–40)

## 2020-02-23 LAB — CARDIOVASCULAR RISK ASSESSMENT

## 2020-02-25 ENCOUNTER — Other Ambulatory Visit: Payer: Self-pay | Admitting: Physician Assistant

## 2020-03-24 ENCOUNTER — Other Ambulatory Visit: Payer: Self-pay | Admitting: Family Medicine

## 2020-03-24 ENCOUNTER — Other Ambulatory Visit: Payer: Self-pay | Admitting: Physician Assistant

## 2020-03-27 ENCOUNTER — Other Ambulatory Visit: Payer: Self-pay | Admitting: Family Medicine

## 2020-03-28 ENCOUNTER — Other Ambulatory Visit: Payer: Self-pay | Admitting: Family Medicine

## 2020-04-08 ENCOUNTER — Other Ambulatory Visit: Payer: Self-pay | Admitting: Physician Assistant

## 2020-05-09 ENCOUNTER — Other Ambulatory Visit: Payer: Self-pay

## 2020-05-09 MED ORDER — LEVOTHYROXINE SODIUM 125 MCG PO TABS: ORAL_TABLET | ORAL | 1 refills | Status: DC

## 2020-05-10 ENCOUNTER — Other Ambulatory Visit: Payer: Self-pay | Admitting: Family Medicine

## 2020-05-13 ENCOUNTER — Other Ambulatory Visit: Payer: Self-pay | Admitting: Physician Assistant

## 2020-05-17 ENCOUNTER — Telehealth: Payer: Self-pay | Admitting: Family Medicine

## 2020-05-17 NOTE — Progress Notes (Signed)
  Chronic Care Management   Note  05/17/2020 Name: SAHARAH SHERROW MRN: [id number] DOB: [birth year]  KHLOEE GARZA is a 65 y.o. year old female who is a primary care patient of Cox, Kirsten, MD. I reached out to [name] by phone today in response to a referral sent by Ms. Asencion Noble PCP, Rochel Brome, MD.   Ms. Bardales was given information about Chronic Care Management services today including:  1. CCM service includes personalized support from designated clinical staff supervised by her physician, including individualized plan of care and coordination with other care providers 2. 24/7 contact phone numbers for assistance for urgent and routine care needs. 3. Service will only be billed when office clinical staff spend 20 minutes or more in a month to coordinate care. 4. Only one practitioner may furnish and bill the service in a calendar month. 5. The patient may stop CCM services at any time (effective at the end of the month) by phone call to the office staff.   Patient agreed to services and verbal consent obtained.   Follow up plan:   Carley Perdue [employer] Scheduler

## 2020-05-19 DIAGNOSIS — M5416 Radiculopathy, lumbar region: Secondary | ICD-10-CM | POA: Diagnosis not present

## 2020-05-24 NOTE — Progress Notes (Signed)
Subjective:  Patient ID: Caitlin Ho, female    DOB: 1955-12-20  Age: 65 y.o. MRN: [id number]  Chief Complaint  Patient presents with  . Hypertension  . Hyperlipidemia    HPI Essential hypertension, benign Metoprolol tartrate 25 mg twice daily, maintains a healthy diet.  Dyslipidemia/prediabetes Fenofibrate 160 mg once daily.No current medications for prediabetes. Has eye exam scheduled for 11/2020.  Secondary hypothyroidism Synthroid 125 mg once daily.    Lumbar pain sciatica down right leg. ESI no help. Repeating mri next week. Seeing Dr. Ellene Route. Gabapentin. Tramadol.  Methocarbamol.  Vitamin D deficiency: taking vitamin D 50,000 units once weekly.  GERD: Well-controlled on omeprazole 40 mg once daily.  Current Outpatient Medications on File Prior to Visit  Medication Sig Dispense Refill  . aspirin EC 81 MG tablet Take 81 mg by mouth daily.    . fenofibrate 160 MG tablet TAKE 1 TABLET EVERY DAY 90 tablet 2  . furosemide (LASIX) 40 MG tablet TAKE 1 TABLET EVERY DAY 90 tablet 1  . gabapentin (NEURONTIN) 600 MG tablet TAKE 1 TABLET THREE TIMES DAILY 270 tablet 1  . levothyroxine (SYNTHROID) 125 MCG tablet TAKE 1 TABLET EVERY DAY BEFORE BREAKFAST 90 tablet 1  . methocarbamol (ROBAXIN) 500 MG tablet TAKE 1 TABLET THREE TIMES DAILY 270 tablet 0  . metoprolol tartrate (LOPRESSOR) 25 MG tablet TAKE 1 TABLET TWICE DAILY 180 tablet 1  . omeprazole (PRILOSEC) 40 MG capsule TAKE 1 CAPSULE EVERY DAY 90 capsule 0  . traMADol (ULTRAM) 50 MG tablet TAKE 2 TABLETS (100 MG TOTAL) BY MOUTH IN THE MORNING, AT NOON, AND AT BEDTIME. 180 tablet 2  . Vitamin D, Ergocalciferol, (DRISDOL) 1.25 MG (50000 UNIT) CAPS capsule TAKE 1 CAPSULE BY MOUTH TWICE WEEKLY 24 capsule 1   No current facility-administered medications on file prior to visit.   Past Medical History:  Diagnosis Date  . Cancer ([place])    cervical cancer cells whwn patient was 22   . Cervicalgia   . GERD (gastroesophageal reflux  disease)    not on medication  . Hashimoto's disease   . Headache(784.0)    sinus  . Homonymous hemianopia   . Hyperlipidemia   . Hypertension   . Hypothyroid   . Other insomnia   . Other sequelae of cerebral infarction   . Palpitations   . PONV (postoperative nausea and vomiting)    06/20/14 patient reported that BP dropped real low and she had N/V  and was slow to awaken when she had surgery at an outpatient center.  . Prediabetes   . Seasonal allergies   . Stroke ([place])   . Supraventricular tachycardia ([place])   . Vitamin D deficiency    Past Surgical History:  Procedure Laterality Date  . ABDOMINAL HYSTERECTOMY    . APPENDECTOMY  1978  . CERVICAL FUSION  2017   2 levels  . LUMBAR DISC SURGERY  10/03/2012   Dr Ellene Route  2 Discectomy  . [place] SURGERY  07/2012  . LUMBAR FUSION  2015   Fusion (2 levels)  . LUMBAR FUSION  2016   fusion 1 level.  . LUMBAR FUSION  02/16/2013   Fusion lumbar.  . THYROIDECTOMY  2006  . THYROIDECTOMY  2006    Family History  Problem Relation Age of Onset  . Aneurysm Mother        brain  . Heart attack Father 48  . Cancer Paternal Grandmother        Breast  .  Hyperlipidemia Other   . Hypertension Other   . Diabetes Other    Social History   Socioeconomic History  . Marital status: Married    Spouse name: Not on file  . Number of children: 1  . Years of education: Not on file  . Highest education level: Not on file  Occupational History  . Occupation: Disabled  Tobacco Use  . Smoking status: Never Smoker  . Smokeless tobacco: Never Used  Vaping Use  . Vaping Use: Never used  Substance and Sexual Activity  . Alcohol use: No  . Drug use: No  . Sexual activity: Yes    Partners: Male    Birth control/protection: None  Other Topics Concern  . Not on file  Social History Narrative  . Not on file   Social Determinants of Health   Financial Resource Strain: Low Risk   . Difficulty of Paying Living Expenses: Not hard at  all  Food Insecurity: No Food Insecurity  . Worried About Charity fundraiser in the Last Year: Never true  . Ran Out of Food in the Last Year: Never true  Transportation Needs: No Transportation Needs  . Lack of Transportation (Medical): No  . Lack of Transportation (Non-Medical): No  Physical Activity: Inactive  . Days of Exercise per Week: 0 days  . Minutes of Exercise per Session: 0 min  Stress: No Stress Concern Present  . Feeling of Stress : Not at all  Social Connections: Socially Integrated  . Frequency of Communication with Friends and Family: More than three times a week  . Frequency of Social Gatherings with Friends and Family: Once a week  . Attends Religious Services: More than 4 times per year  . Active Member of Clubs or Organizations: Yes  . Attends Archivist Meetings: More than 4 times per year  . Marital Status: Married    Review of Systems  Constitutional: Negative for chills, fatigue and fever.  HENT: Negative for congestion, ear pain, rhinorrhea and sore throat.   Respiratory: Negative for cough and shortness of breath.   Cardiovascular: Negative for chest pain.  Gastrointestinal: Negative for abdominal pain, constipation, diarrhea, nausea and vomiting.  Genitourinary: Negative for dysuria and urgency.  Musculoskeletal: Positive for back pain. Negative for myalgias.  Neurological: Negative for dizziness, weakness, light-headedness and headaches.  Psychiatric/Behavioral: Negative for dysphoric mood. The patient is not nervous/anxious.      Objective:  BP 110/62   Pulse 82   Temp (!) 97.1 F (36.2 C)   Ht 5\' 5"  (1.651 m)   Wt 214 lb (97.1 kg)   LMP  (LMP Unknown)   SpO2 93%   BMI 35.61 kg/m   BP/Weight 05/25/2020 02/22/2020 74/25/9563  Systolic BP 875 643 329  Diastolic BP 62 80 68  Wt. (Lbs) 214 206 209  BMI 35.61 34.28 34.78    Physical Exam Vitals reviewed.  Constitutional:      Appearance: Normal appearance. She is normal  weight.  Neck:     Vascular: No carotid bruit.  Cardiovascular:     Rate and Rhythm: Normal rate and regular rhythm.     Pulses: Normal pulses.     Heart sounds: Normal heart sounds.  Pulmonary:     Effort: Pulmonary effort is normal. No respiratory distress.     Breath sounds: Normal breath sounds.  Abdominal:     General: Abdomen is flat. Bowel sounds are normal.     Palpations: Abdomen is soft.  Tenderness: There is no abdominal tenderness.  Musculoskeletal:        General: Tenderness (FM trigger point. lumbar. ) present.  Neurological:     Mental Status: She is alert and oriented to person, place, and time.  Psychiatric:        Mood and Affect: Mood normal.        Behavior: Behavior normal.     Diabetic Foot Exam - Simple   Simple Foot Form Diabetic Foot exam was performed with the following findings: Yes 05/25/2020  9:09 AM  Visual Inspection No deformities, no ulcerations, no other skin breakdown bilaterally: Yes Sensation Testing Intact to touch and monofilament testing bilaterally: Yes Pulse Check Posterior Tibialis and Dorsalis pulse intact bilaterally: Yes Comments      Lab Results  Component Value Date   WBC 11.3 (H) 02/22/2020   HGB 14.7 02/22/2020   HCT 43.9 02/22/2020   PLT 471 (H) 02/22/2020   GLUCOSE 92 02/22/2020   CHOL 174 02/22/2020   TRIG 99 02/22/2020   HDL 51 02/22/2020   LDLCALC 105 (H) 02/22/2020   ALT 14 02/22/2020   AST 16 02/22/2020   NA 142 02/22/2020   K 4.8 02/22/2020   CL 103 02/22/2020   CREATININE 0.98 02/22/2020   BUN 21 02/22/2020   CO2 27 02/22/2020   TSH 0.654 02/22/2020   HGBA1C 6.0 (H) 02/22/2020      Assessment & Plan:   1. Essential hypertension, benign Well controlled.  No changes to medicines.  Continue to work on eating a healthy diet and exercise.  Labs drawn today.  - Comprehensive metabolic panel  Prediabetes Recommend continue to work on eating healthy diet and exercise. - CBC with  Differential/Platelet - Hemoglobin A1c - Lipid panel  3. Secondary hypothyroidism The current medical regimen is effective;  continue present plan and medications.  4. Mixed hyperlipidemia Well controlled.  No changes to medicines.  Continue to work on eating a healthy diet and exercise.  Labs drawn today.  - FLP  5. Lumbar radiculopathy Management per specialist. Dr Ellene Route.  6. Class 2 severe obesity due to excess calories with serious comorbidity and body mass index (BMI) of 35.0 to 35.9 in adult [facility]) Recommend continue to work on eating healthy diet and exercise.  7. Fibromyalgia  The current medical regimen is effective;  continue present plan and medications.   No orders of the defined types were placed in this encounter.   Orders Placed This Encounter  Procedures  . CBC with Differential/Platelet  . Comprehensive metabolic panel  . Hemoglobin A1c  . Lipid panel     Follow-up: Return in about 3 months (around 08/25/2020) for fasting.  An After Visit Summary was printed and given to the patient.  Rochel Brome, MD [facility] [phone]

## 2020-05-25 ENCOUNTER — Ambulatory Visit (INDEPENDENT_AMBULATORY_CARE_PROVIDER_SITE_OTHER): Payer: Medicare HMO | Admitting: Family Medicine

## 2020-05-25 ENCOUNTER — Other Ambulatory Visit: Payer: Self-pay

## 2020-05-25 ENCOUNTER — Encounter: Payer: Self-pay | Admitting: Family Medicine

## 2020-05-25 VITALS — BP 110/62 | HR 82 | Temp 97.1°F | Ht 65.0 in | Wt 214.0 lb

## 2020-05-25 DIAGNOSIS — I10 Essential (primary) hypertension: Secondary | ICD-10-CM

## 2020-05-25 DIAGNOSIS — E1169 Type 2 diabetes mellitus with other specified complication: Secondary | ICD-10-CM | POA: Diagnosis not present

## 2020-05-25 DIAGNOSIS — E785 Hyperlipidemia, unspecified: Secondary | ICD-10-CM | POA: Diagnosis not present

## 2020-05-25 DIAGNOSIS — E782 Mixed hyperlipidemia: Secondary | ICD-10-CM | POA: Diagnosis not present

## 2020-05-25 DIAGNOSIS — Z6835 Body mass index (BMI) 35.0-35.9, adult: Secondary | ICD-10-CM

## 2020-05-25 DIAGNOSIS — R7303 Prediabetes: Secondary | ICD-10-CM | POA: Diagnosis not present

## 2020-05-25 DIAGNOSIS — E038 Other specified hypothyroidism: Secondary | ICD-10-CM

## 2020-05-25 DIAGNOSIS — M797 Fibromyalgia: Secondary | ICD-10-CM | POA: Diagnosis not present

## 2020-05-25 DIAGNOSIS — M5416 Radiculopathy, lumbar region: Secondary | ICD-10-CM

## 2020-05-26 LAB — COMPREHENSIVE METABOLIC PANEL
ALT: 18 IU/L (ref 0–32)
AST: 24 IU/L (ref 0–40)
Albumin/Globulin Ratio: 1.6 (ref 1.2–2.2)
Albumin: 4 g/dL (ref 3.8–4.8)
Alkaline Phosphatase: 57 IU/L (ref 44–121)
BUN/Creatinine Ratio: 19 (ref 12–28)
BUN: 18 mg/dL (ref 8–27)
Bilirubin Total: 0.3 mg/dL (ref 0.0–1.2)
CO2: 25 mmol/L (ref 20–29)
Calcium: 9.7 mg/dL (ref 8.7–10.3)
Chloride: 103 mmol/L (ref 96–106)
Creatinine, Ser: 0.97 mg/dL (ref 0.57–1.00)
Globulin, Total: 2.5 g/dL (ref 1.5–4.5)
Glucose: 94 mg/dL (ref 65–99)
Potassium: 5.1 mmol/L (ref 3.5–5.2)
Sodium: 141 mmol/L (ref 134–144)
Total Protein: 6.5 g/dL (ref 6.0–8.5)
eGFR: 66 mL/min/{1.73_m2} (ref >59)

## 2020-05-26 LAB — CBC WITH DIFFERENTIAL/PLATELET
Basophils Absolute: 0.2 10*3/uL (ref 0.0–0.2)
Basos: 2 %
EOS (ABSOLUTE): 0.4 10*3/uL (ref 0.0–0.4)
Eos: 4 %
Hematocrit: 43.1 % (ref 34.0–46.6)
Hemoglobin: 14.3 g/dL (ref 11.1–15.9)
Immature Grans (Abs): 0.1 10*3/uL (ref 0.0–0.1)
Immature Granulocytes: 1 %
Lymphocytes Absolute: 4.3 10*3/uL — ABNORMAL HIGH (ref 0.7–3.1)
Lymphs: 42 %
MCH: 29.9 pg (ref 26.6–33.0)
MCHC: 33.2 g/dL (ref 31.5–35.7)
MCV: 90 fL (ref 79–97)
Monocytes Absolute: 0.7 10*3/uL (ref 0.1–0.9)
Monocytes: 7 %
Neutrophils Absolute: 4.5 10*3/uL (ref 1.4–7.0)
Neutrophils: 44 %
Platelets: 368 10*3/uL (ref 150–450)
RBC: 4.78 x10E6/uL (ref 3.77–5.28)
RDW: 12.1 % (ref 11.7–15.4)
WBC: 10.2 10*3/uL (ref 3.4–10.8)

## 2020-05-26 LAB — HEMOGLOBIN A1C
Est. average glucose Bld gHb Est-mCnc: 126 mg/dL
Hgb A1c MFr Bld: 6 % — ABNORMAL HIGH (ref 4.8–5.6)

## 2020-05-26 LAB — LIPID PANEL
Chol/HDL Ratio: 3.8 ratio (ref 0.0–4.4)
Cholesterol, Total: 164 mg/dL (ref 100–199)
HDL: 43 mg/dL (ref >39)
LDL Chol Calc (NIH): 97 mg/dL (ref 0–99)
Triglycerides: 137 mg/dL (ref 0–149)
VLDL Cholesterol Cal: 24 mg/dL (ref 5–40)

## 2020-05-26 LAB — CARDIOVASCULAR RISK ASSESSMENT

## 2020-05-31 DIAGNOSIS — Z6833 Body mass index (BMI) 33.0-33.9, adult: Secondary | ICD-10-CM | POA: Diagnosis not present

## 2020-05-31 DIAGNOSIS — M5117 Intervertebral disc disorders with radiculopathy, lumbosacral region: Secondary | ICD-10-CM | POA: Diagnosis not present

## 2020-05-31 DIAGNOSIS — M5416 Radiculopathy, lumbar region: Secondary | ICD-10-CM | POA: Diagnosis not present

## 2020-06-02 ENCOUNTER — Other Ambulatory Visit: Payer: Self-pay | Admitting: Neurological Surgery

## 2020-06-05 ENCOUNTER — Other Ambulatory Visit: Payer: Self-pay | Admitting: Physician Assistant

## 2020-06-26 NOTE — Progress Notes (Signed)
Surgical Instructions    Your procedure is scheduled on Thursday 06/29/2020.  Report to [facility] Main Entrance "A" at 05:30 A.M., then check in with the Admitting office.  Call this number if you have problems the morning of surgery:  [phone]   If you have any questions prior to your surgery date call [phone]: Open Monday-Friday 8am-4pm    Remember:  Do not eat or drink after midnight the night before your surgery    Take these medicines the morning of surgery with A SIP OF WATER: Fenofibrate Gabapentin (Neurontin) Levothyroxine (Synthroid) Methocarbamol (Robaxin) Metoprolol (Lopressor) Omeprazole (Prilosec) Tramadol (Ultram)  If needed, you may take the following medications the morning of surgery: Cetirizine (Zyrtec) Hydrocodone-acetaminophen (Norco/Vicodin)  As of today, STOP taking any Aspirin-containing products, Aleve, Naproxen, Ibuprofen, Motrin, Advil, Goody's, BC's, all herbal medications, fish oil, and all vitamins.          Follow your surgeon's instructions on when to stop Aspirin.  If no instructions were given by your surgeon then you will need to call the office to get those instructions.               Do not wear jewelry, make up, or nail polish            Do not wear lotions, powders, perfumes, or deodorant.            Do not shave 48 hours prior to surgery.              Do not bring valuables to the hospital.            [facility] is not responsible for any belongings or valuables.  Do NOT Smoke (Tobacco/Vaping) or drink Alcohol 24 hours prior to your procedure  If you use a CPAP at night, you may bring all equipment for your overnight stay.   Contacts, glasses, hearing aids, dentures or partials may not be worn into surgery, please bring cases for these belongings.   For patients admitted to the hospital, discharge time will be determined by your treatment team.   Patients discharged the day of surgery will not be allowed to drive home, and  someone needs to stay with them for 24 hours.    Special instructions:   [place]- Preparing For Surgery  Before surgery, you can play an important role. Because skin is not sterile, your skin needs to be as free of germs as possible. You can reduce the number of germs on your skin by washing with CHG (chlorahexidine gluconate) Soap before surgery.  CHG is an antiseptic cleaner which kills germs and bonds with the skin to continue killing germs even after washing.    Oral Hygiene is also important to reduce your risk of infection.  Remember - BRUSH YOUR TEETH THE MORNING OF SURGERY WITH YOUR REGULAR TOOTHPASTE  Please do not use if you have an allergy to CHG or antibacterial soaps. If your skin becomes reddened/irritated stop using the CHG.  Do not shave (including legs and underarms) for at least 48 hours prior to first CHG shower. It is OK to shave your face.  Please follow these instructions carefully.   1. Shower the NIGHT BEFORE SURGERY and the MORNING OF SURGERY  2. If you chose to wash your hair, wash your hair first as usual with your normal shampoo.  3. After you shampoo, rinse your hair and body thoroughly to remove the shampoo.  4. Wash Face and genitals (private parts) with your  normal soap.   5.  Shower the NIGHT BEFORE SURGERY and the MORNING OF SURGERY with CHG Soap.   6. Use CHG Soap as you would any other liquid soap. You can apply CHG directly to the skin and wash gently with a scrungie or a clean washcloth.   7. Apply the CHG Soap to your body ONLY FROM THE NECK DOWN.  Do not use on open wounds or open sores. Avoid contact with your eyes, ears, mouth and genitals (private parts). Wash Face and genitals (private parts)  with your normal soap.   8. Wash thoroughly, paying special attention to the area where your surgery will be performed.  9. Thoroughly rinse your body with warm water from the neck down.  10. DO NOT shower/wash with your normal soap after using  and rinsing off the CHG Soap.  11. Pat yourself dry with a CLEAN TOWEL.  12. Wear CLEAN PAJAMAS to bed the night before surgery  13. Place CLEAN SHEETS on your bed the night before your surgery  14. DO NOT SLEEP WITH PETS.   Day of Surgery: Take a shower with CHG soap. Wear Clean/Comfortable clothing the morning of surgery Do not apply any deodorants/lotions.   Remember to brush your teeth WITH YOUR REGULAR TOOTHPASTE.   Please read over the following fact sheets that you were given.

## 2020-06-27 ENCOUNTER — Encounter (HOSPITAL_COMMUNITY): Payer: Self-pay

## 2020-06-27 ENCOUNTER — Encounter (HOSPITAL_COMMUNITY)
Admission: RE | Admit: 2020-06-27 | Discharge: 2020-06-27 | Disposition: A | Discharge status: Home / Self Care | Payer: Medicare HMO | Source: Ambulatory Visit | Attending: Neurological Surgery | Admitting: Neurological Surgery

## 2020-06-27 ENCOUNTER — Other Ambulatory Visit: Payer: Self-pay

## 2020-06-27 DIAGNOSIS — Z6836 Body mass index (BMI) 36.0-36.9, adult: Secondary | ICD-10-CM | POA: Insufficient documentation

## 2020-06-27 DIAGNOSIS — M4807 Spinal stenosis, lumbosacral region: Secondary | ICD-10-CM | POA: Diagnosis present

## 2020-06-27 DIAGNOSIS — Z7982 Long term (current) use of aspirin: Secondary | ICD-10-CM | POA: Diagnosis not present

## 2020-06-27 DIAGNOSIS — M47817 Spondylosis without myelopathy or radiculopathy, lumbosacral region: Secondary | ICD-10-CM | POA: Diagnosis present

## 2020-06-27 DIAGNOSIS — Z8249 Family history of ischemic heart disease and other diseases of the circulatory system: Secondary | ICD-10-CM | POA: Diagnosis not present

## 2020-06-27 DIAGNOSIS — M5416 Radiculopathy, lumbar region: Secondary | ICD-10-CM | POA: Diagnosis present

## 2020-06-27 DIAGNOSIS — G4709 Other insomnia: Secondary | ICD-10-CM | POA: Diagnosis present

## 2020-06-27 DIAGNOSIS — I6930 Unspecified sequelae of cerebral infarction: Secondary | ICD-10-CM | POA: Diagnosis not present

## 2020-06-27 DIAGNOSIS — Z20822 Contact with and (suspected) exposure to covid-19: Secondary | ICD-10-CM | POA: Insufficient documentation

## 2020-06-27 DIAGNOSIS — Z79890 Hormone replacement therapy (postmenopausal): Secondary | ICD-10-CM | POA: Diagnosis not present

## 2020-06-27 DIAGNOSIS — Z888 Allergy status to other drugs, medicaments and biological substances status: Secondary | ICD-10-CM | POA: Diagnosis not present

## 2020-06-27 DIAGNOSIS — I10 Essential (primary) hypertension: Secondary | ICD-10-CM | POA: Diagnosis present

## 2020-06-27 DIAGNOSIS — G4730 Sleep apnea, unspecified: Secondary | ICD-10-CM | POA: Diagnosis present

## 2020-06-27 DIAGNOSIS — Z833 Family history of diabetes mellitus: Secondary | ICD-10-CM | POA: Diagnosis not present

## 2020-06-27 DIAGNOSIS — M4326 Fusion of spine, lumbar region: Secondary | ICD-10-CM | POA: Diagnosis not present

## 2020-06-27 DIAGNOSIS — Z79899 Other long term (current) drug therapy: Secondary | ICD-10-CM | POA: Diagnosis not present

## 2020-06-27 DIAGNOSIS — Z8673 Personal history of transient ischemic attack (TIA), and cerebral infarction without residual deficits: Secondary | ICD-10-CM | POA: Insufficient documentation

## 2020-06-27 DIAGNOSIS — E669 Obesity, unspecified: Secondary | ICD-10-CM | POA: Insufficient documentation

## 2020-06-27 DIAGNOSIS — M4727 Other spondylosis with radiculopathy, lumbosacral region: Secondary | ICD-10-CM | POA: Diagnosis not present

## 2020-06-27 DIAGNOSIS — J302 Other seasonal allergic rhinitis: Secondary | ICD-10-CM | POA: Diagnosis present

## 2020-06-27 DIAGNOSIS — K219 Gastro-esophageal reflux disease without esophagitis: Secondary | ICD-10-CM | POA: Diagnosis present

## 2020-06-27 DIAGNOSIS — E785 Hyperlipidemia, unspecified: Secondary | ICD-10-CM | POA: Diagnosis present

## 2020-06-27 DIAGNOSIS — Z01818 Encounter for other preprocedural examination: Secondary | ICD-10-CM | POA: Insufficient documentation

## 2020-06-27 DIAGNOSIS — Z981 Arthrodesis status: Secondary | ICD-10-CM | POA: Diagnosis not present

## 2020-06-27 DIAGNOSIS — M797 Fibromyalgia: Secondary | ICD-10-CM | POA: Diagnosis present

## 2020-06-27 DIAGNOSIS — E559 Vitamin D deficiency, unspecified: Secondary | ICD-10-CM | POA: Diagnosis present

## 2020-06-27 DIAGNOSIS — M48061 Spinal stenosis, lumbar region without neurogenic claudication: Secondary | ICD-10-CM | POA: Diagnosis not present

## 2020-06-27 DIAGNOSIS — M48062 Spinal stenosis, lumbar region with neurogenic claudication: Secondary | ICD-10-CM | POA: Diagnosis present

## 2020-06-27 HISTORY — DX: Prediabetes: R73.03

## 2020-06-27 HISTORY — DX: Fibromyalgia: M79.7

## 2020-06-27 HISTORY — DX: Sleep apnea, unspecified: G47.30

## 2020-06-27 HISTORY — DX: Pneumonia, unspecified organism: J18.9

## 2020-06-27 LAB — BASIC METABOLIC PANEL
Anion gap: 4 — ABNORMAL LOW (ref 5–15)
BUN: 16 mg/dL (ref 8–23)
CO2: 30 mmol/L (ref 22–32)
Calcium: 9.2 mg/dL (ref 8.9–10.3)
Chloride: 106 mmol/L (ref 98–111)
Creatinine, Ser: 0.97 mg/dL (ref 0.44–1.00)
GFR, Estimated: >60 mL/min (ref >60)
Glucose, Bld: 112 mg/dL — ABNORMAL HIGH (ref 70–99)
Potassium: 4.2 mmol/L (ref 3.5–5.1)
Sodium: 140 mmol/L (ref 135–145)

## 2020-06-27 LAB — CBC
HCT: 44.9 % (ref 36.0–46.0)
Hemoglobin: 14.1 g/dL (ref 12.0–15.0)
MCH: 29.9 pg (ref 26.0–34.0)
MCHC: 31.4 g/dL (ref 30.0–36.0)
MCV: 95.1 fL (ref 80.0–100.0)
Platelets: 360 10*3/uL (ref 150–400)
RBC: 4.72 MIL/uL (ref 3.87–5.11)
RDW: 13 % (ref 11.5–15.5)
WBC: 10.7 10*3/uL — ABNORMAL HIGH (ref 4.0–10.5)
nRBC: 0 % (ref 0.0–0.2)

## 2020-06-27 LAB — SARS CORONAVIRUS 2 (TAT 6-24 HRS): SARS Coronavirus 2: NEGATIVE

## 2020-06-27 LAB — TYPE AND SCREEN
ABO/RH(D): O POS
Antibody Screen: NEGATIVE

## 2020-06-27 LAB — SURGICAL PCR SCREEN
MRSA, PCR: NEGATIVE
Staphylococcus aureus: NEGATIVE

## 2020-06-27 NOTE — Progress Notes (Signed)
PCP - Rochel Brome, MD Cardiologist - Denies  PPM/ICD - Denies  Chest x-ray - N/A EKG - 06/27/20 Stress Test - 02/20/18 ECHO - 02/20/18 Cardiac Cath - Denies  Sleep Study - 10/2018, positive for OSA CPAP - No  Patient is pre-diabetic, does not check CBGs at home. Last A1C was 6.0 05/25/20  Blood Thinner Instructions: N/A Aspirin Instructions: Per pt, last dose 06/20/20  ERAS Protcol - N/A PRE-SURGERY Ensure or G2- N/A  COVID TEST- 06/27/20; Pending   Anesthesia review: Yes, ardiac hx/ testing  Patient denies shortness of breath, fever, cough and chest pain at PAT appointment   All instructions explained to the patient, with a verbal understanding of the material. Patient agrees to go over the instructions while at home for a better understanding. Patient also instructed to self quarantine after being tested for COVID-19. The opportunity to ask questions was provided.

## 2020-06-28 NOTE — Anesthesia Preprocedure Evaluation (Addendum)
Anesthesia Evaluation  Patient identified by MRN, date of birth, ID band Patient awake    Reviewed: Allergy & Precautions, H&P , NPO status , Patient's Chart, lab work & pertinent test results  History of Anesthesia Complications (+) PONV  Airway Mallampati: II  TM Distance: >3 FB Neck ROM: Full    Dental no notable dental hx. (+) Teeth Intact, Dental Advisory Given   Pulmonary sleep apnea ,    Pulmonary exam normal breath sounds clear to auscultation       Cardiovascular Exercise Tolerance: Good hypertension, Pt. on medications and Pt. on home beta blockers  Rhythm:Regular Rate:Normal     Neuro/Psych  Headaches, CVA negative psych ROS   GI/Hepatic Neg liver ROS, GERD  Medicated,  Endo/Other  diabetesHypothyroidism Morbid obesity  Renal/GU negative Renal ROS  negative genitourinary   Musculoskeletal  (+) Fibromyalgia -  Abdominal   Peds  Hematology negative hematology ROS (+)   Anesthesia Other Findings   Reproductive/Obstetrics negative OB ROS                           Anesthesia Physical Anesthesia Plan  ASA: III  Anesthesia Plan: General   Post-op Pain Management:    Induction: Intravenous  PONV Risk Score and Plan: 4 or greater and Ondansetron, Aprepitant, Dexamethasone, Midazolam and Scopolamine patch - Pre-op  Airway Management Planned: Oral ETT  Additional Equipment:   Intra-op Plan:   Post-operative Plan: Extubation in OR  Informed Consent: I have reviewed the patients History and Physical, chart, labs and discussed the procedure including the risks, benefits and alternatives for the proposed anesthesia with the patient or authorized representative who has indicated his/her understanding and acceptance.     Dental advisory given  Plan Discussed with: CRNA  Anesthesia Plan Comments: (PAT note by Karoline Caldwell, PA-C: 65 year old female for L5-S1 fusion with Dr.  Ellene Route on 06/29/2020.  Pertinent history includes HTN, CVA, palpitations, OSA not on CPAP, prediabetes, obesity (BMI 36).  Patient admitted December 2019 for evaluation of chest pain.  At that time she was noted to have a history of CVA, HTN, prediabetes, palpitations (previously evaluated with Holter monitor 2018 with rare PVCs, occ PAC with 2 runs of tachycardia, one 96 beats long, atrial tach, no symptoms reported).    During admission she had echo that showed normal LVEF, normal wall motion and nuclear stress test without ischemia.  Based on these results, no further cardiac testing was planned and she was deemed stable for discharge from cardiology standpoint.  Patient follows regularly with her PCP Dr. Rochel Brome for management of chronic medical conditions.  Last seen 05/25/2020 and noted that her HTN, prediabetes, HLD were all well controlled.  No changes made to management.  Discussed that she was following with Dr. Ellene Route for lumbar radiculopathy.  I spoke with the patient via phone on 06/28/2020 and she stated that she has had no further episodes of chest pain since that evaluation in December 2019.  She reports that at baseline she is not very active given her history of multiple lumbar surgeries and chronic lumbar pain.  However, she says she can go up 2 flights of stairs without significant shortness of breath or chest pain.  She reports that her primary limitations are orthopedic, not cardiopulmonary.  Preop EKG noted to be abnormal with inferior and anterolateral ST and T wave abnormalities.  Reviewed this tracing as well as the tracing from 02/20/2018 (at the time of her  chest pain evaluation) with Dr. Suzette Battiest.  He felt preop tracing appeared overall similar to tracing from 2019.  Given previous benign ischemia evaluation in December 2019, absence of any new cardiopulmonary complaints, and her ability to go up 2 flights of stairs, he advised okay to proceed with surgery as  planned.  History of OSA, patient is not on CPAP.  Prediabetic, last A1c 6.0 on 05/25/2020.  Preop labs reviewed, unremarkable.  EKG 06/27/20: Normal sinus rhythm with sinus arrhythmia.  Rate 69.  Inferior and anterolateral ST and T wave abnormalities noted.  Nuclear stress 02/21/2018: There was no ST segment deviation noted during stress. Defect 1: There is a medium defect of moderate severity present in the mid anterior, mid anteroseptal and apical anterior location. This is a low risk study. Nuclear stress EF: 63%. The left ventricular ejection fraction is normal (55-65%).   Stress nuclear study with probable soft tissue attenuation but no ischemia; EF 63 with normal wall motion.   TTE 02/20/2018: Study Conclusions   - Left ventricle: The cavity size was normal. Wall thickness was  normal. Systolic function was normal. The estimated ejection  fraction was in the range of 55% to 60%. Wall motion was normal;  there were no regional wall motion abnormalities. Doppler  parameters are consistent with abnormal left ventricular  relaxation (grade 1 diastolic dysfunction).   Impressions:   - Definity used; normal LV function; mild diastolic dysfunction.    )      Anesthesia Quick Evaluation

## 2020-06-28 NOTE — Progress Notes (Signed)
Anesthesia Chart Review:  65 year old female for L5-S1 fusion with Dr. Ellene Route on 06/29/2020.  Pertinent history includes HTN, CVA, palpitations, OSA not on CPAP, prediabetes, obesity (BMI 36).  Patient admitted December 2019 for evaluation of chest pain.  At that time she was noted to have a history of CVA, HTN, prediabetes, palpitations (previously evaluated with Holter monitor 2018 with rare PVCs, occ PAC with 2 runs of tachycardia, one 96 beats long, atrial tach, no symptoms reported).    During admission she had echo that showed normal LVEF, normal wall motion and nuclear stress test without ischemia.  Based on these results, no further cardiac testing was planned and she was deemed stable for discharge from cardiology standpoint.  Patient follows regularly with her PCP Dr. Rochel Brome for management of chronic medical conditions.  Last seen 05/25/2020 and noted that her HTN, prediabetes, HLD were all well controlled.  No changes made to management.  Discussed that she was following with Dr. Ellene Route for lumbar radiculopathy.  I spoke with the patient via phone on 06/28/2020 and she stated that she has had no further episodes of chest pain since that evaluation in December 2019.  She reports that at baseline she is not very active given her history of multiple lumbar surgeries and chronic lumbar pain.  However, she says she can go up 2 flights of stairs without significant shortness of breath or chest pain.  She reports that her primary limitations are orthopedic, not cardiopulmonary.  Preop EKG noted to be abnormal with inferior and anterolateral ST and T wave abnormalities.  Reviewed this tracing as well as the tracing from 02/20/2018 (at the time of her chest pain evaluation) with Dr. Suzette Battiest.  He felt preop tracing appeared overall similar to tracing from 2019.  Given previous benign ischemia evaluation in December 2019, absence of any new cardiopulmonary complaints, and her ability to go up 2  flights of stairs, he advised okay to proceed with surgery as planned.  History of OSA, patient is not on CPAP.  Prediabetic, last A1c 6.0 on 05/25/2020.  Preop labs reviewed, unremarkable.  EKG 06/27/20: Normal sinus rhythm with sinus arrhythmia.  Rate 69.  Inferior and anterolateral ST and T wave abnormalities noted.  Nuclear stress 02/21/2018:  There was no ST segment deviation noted during stress.  Defect 1: There is a medium defect of moderate severity present in the mid anterior, mid anteroseptal and apical anterior location.  This is a low risk study.  Nuclear stress EF: 63%.  The left ventricular ejection fraction is normal (55-65%).   Stress nuclear study with probable soft tissue attenuation but no ischemia; EF 63 with normal wall motion.   TTE 02/20/2018: Study Conclusions   - Left ventricle: The cavity size was normal. Wall thickness was  normal. Systolic function was normal. The estimated ejection  fraction was in the range of 55% to 60%. Wall motion was normal;  there were no regional wall motion abnormalities. Doppler  parameters are consistent with abnormal left ventricular  relaxation (grade 1 diastolic dysfunction).   Impressions:   - Definity used; normal LV function; mild diastolic dysfunction.     Wynonia Musty [facility] Short Stay Center/Anesthesiology Phone [phone] 06/28/2020 11:25 AM

## 2020-06-29 ENCOUNTER — Inpatient Hospital Stay (HOSPITAL_COMMUNITY): Payer: Medicare HMO

## 2020-06-29 ENCOUNTER — Encounter (HOSPITAL_COMMUNITY): Payer: Self-pay | Admitting: Neurological Surgery

## 2020-06-29 ENCOUNTER — Encounter (HOSPITAL_COMMUNITY)
Admission: RE | Disposition: A | Discharge status: Home / Self Care | Payer: Self-pay | Source: Ambulatory Visit | Attending: Neurological Surgery

## 2020-06-29 ENCOUNTER — Other Ambulatory Visit: Payer: Self-pay

## 2020-06-29 ENCOUNTER — Inpatient Hospital Stay (HOSPITAL_COMMUNITY): Payer: Medicare HMO | Admitting: Physician Assistant

## 2020-06-29 ENCOUNTER — Inpatient Hospital Stay (HOSPITAL_COMMUNITY): Payer: Medicare HMO | Admitting: Anesthesiology

## 2020-06-29 ENCOUNTER — Inpatient Hospital Stay (HOSPITAL_COMMUNITY)
Admission: RE | Admit: 2020-06-29 | Discharge: 2020-07-01 | DRG: 455 | Disposition: A | Discharge status: Home / Self Care | Payer: Medicare HMO | Source: Ambulatory Visit | Attending: Neurological Surgery | Admitting: Neurological Surgery

## 2020-06-29 DIAGNOSIS — Z833 Family history of diabetes mellitus: Secondary | ICD-10-CM

## 2020-06-29 DIAGNOSIS — K219 Gastro-esophageal reflux disease without esophagitis: Secondary | ICD-10-CM | POA: Diagnosis present

## 2020-06-29 DIAGNOSIS — M797 Fibromyalgia: Secondary | ICD-10-CM | POA: Diagnosis present

## 2020-06-29 DIAGNOSIS — M48061 Spinal stenosis, lumbar region without neurogenic claudication: Secondary | ICD-10-CM | POA: Diagnosis not present

## 2020-06-29 DIAGNOSIS — Z888 Allergy status to other drugs, medicaments and biological substances status: Secondary | ICD-10-CM

## 2020-06-29 DIAGNOSIS — Z8249 Family history of ischemic heart disease and other diseases of the circulatory system: Secondary | ICD-10-CM | POA: Diagnosis not present

## 2020-06-29 DIAGNOSIS — M47817 Spondylosis without myelopathy or radiculopathy, lumbosacral region: Secondary | ICD-10-CM | POA: Diagnosis present

## 2020-06-29 DIAGNOSIS — J302 Other seasonal allergic rhinitis: Secondary | ICD-10-CM | POA: Diagnosis present

## 2020-06-29 DIAGNOSIS — I10 Essential (primary) hypertension: Secondary | ICD-10-CM | POA: Diagnosis not present

## 2020-06-29 DIAGNOSIS — M4326 Fusion of spine, lumbar region: Secondary | ICD-10-CM | POA: Diagnosis not present

## 2020-06-29 DIAGNOSIS — Z79899 Other long term (current) drug therapy: Secondary | ICD-10-CM | POA: Diagnosis not present

## 2020-06-29 DIAGNOSIS — Z7982 Long term (current) use of aspirin: Secondary | ICD-10-CM | POA: Diagnosis not present

## 2020-06-29 DIAGNOSIS — G4730 Sleep apnea, unspecified: Secondary | ICD-10-CM | POA: Diagnosis present

## 2020-06-29 DIAGNOSIS — M48062 Spinal stenosis, lumbar region with neurogenic claudication: Principal | ICD-10-CM | POA: Diagnosis present

## 2020-06-29 DIAGNOSIS — Z981 Arthrodesis status: Secondary | ICD-10-CM | POA: Diagnosis not present

## 2020-06-29 DIAGNOSIS — I6930 Unspecified sequelae of cerebral infarction: Secondary | ICD-10-CM

## 2020-06-29 DIAGNOSIS — M5416 Radiculopathy, lumbar region: Secondary | ICD-10-CM | POA: Diagnosis present

## 2020-06-29 DIAGNOSIS — E785 Hyperlipidemia, unspecified: Secondary | ICD-10-CM | POA: Diagnosis present

## 2020-06-29 DIAGNOSIS — G4709 Other insomnia: Secondary | ICD-10-CM | POA: Diagnosis present

## 2020-06-29 DIAGNOSIS — Z419 Encounter for procedure for purposes other than remedying health state, unspecified: Secondary | ICD-10-CM

## 2020-06-29 DIAGNOSIS — Z79890 Hormone replacement therapy (postmenopausal): Secondary | ICD-10-CM | POA: Diagnosis not present

## 2020-06-29 DIAGNOSIS — E559 Vitamin D deficiency, unspecified: Secondary | ICD-10-CM | POA: Diagnosis present

## 2020-06-29 DIAGNOSIS — M4727 Other spondylosis with radiculopathy, lumbosacral region: Secondary | ICD-10-CM | POA: Diagnosis not present

## 2020-06-29 DIAGNOSIS — M4807 Spinal stenosis, lumbosacral region: Secondary | ICD-10-CM | POA: Diagnosis not present

## 2020-06-29 DIAGNOSIS — Z20822 Contact with and (suspected) exposure to covid-19: Secondary | ICD-10-CM | POA: Diagnosis not present

## 2020-06-29 HISTORY — DX: Spinal stenosis, lumbar region with neurogenic claudication: M48.062

## 2020-06-29 HISTORY — PX: BACK SURGERY: SHX140

## 2020-06-29 LAB — GLUCOSE, CAPILLARY
Glucose-Capillary: 115 mg/dL — ABNORMAL HIGH (ref 70–99)
Glucose-Capillary: 183 mg/dL — ABNORMAL HIGH (ref 70–99)
Glucose-Capillary: 200 mg/dL — ABNORMAL HIGH (ref 70–99)
Glucose-Capillary: 183 mg/dL — ABNORMAL HIGH (ref 70–99)

## 2020-06-29 SURGERY — POSTERIOR LUMBAR FUSION 1 LEVEL
Anesthesia: General | Site: Back

## 2020-06-29 MED ORDER — ORAL CARE MOUTH RINSE: 15.0000 mL | Freq: Once | OROMUCOSAL | Status: AC

## 2020-06-29 MED ORDER — PANTOPRAZOLE SODIUM 40 MG PO TBEC
80.0000 mg | DELAYED_RELEASE_TABLET | Freq: Every day | ORAL | Status: DC
  Administered 2020-06-29 – 2020-06-30 (×3): 80 mg via ORAL
  Filled 2020-06-29 (×3): qty 2

## 2020-06-29 MED ORDER — PHENYLEPHRINE 40 MCG/ML (10ML) SYRINGE FOR IV PUSH (FOR BLOOD PRESSURE SUPPORT)
PREFILLED_SYRINGE | INTRAVENOUS | Status: AC
  Filled 2020-06-29: qty 10

## 2020-06-29 MED ORDER — THROMBIN 20000 UNITS EX SOLR
CUTANEOUS | Status: AC
  Filled 2020-06-29: qty 20000

## 2020-06-29 MED ORDER — CHLORHEXIDINE GLUCONATE CLOTH 2 % EX PADS: 6.0000 | MEDICATED_PAD | Freq: Once | CUTANEOUS | Status: DC

## 2020-06-29 MED ORDER — METHOCARBAMOL 500 MG PO TABS
ORAL_TABLET | ORAL | Status: AC
  Filled 2020-06-29: qty 1

## 2020-06-29 MED ORDER — BISACODYL 10 MG RE SUPP: 10.0000 mg | Freq: Every day | RECTAL | Status: DC | PRN

## 2020-06-29 MED ORDER — FENTANYL CITRATE (PF) 250 MCG/5ML IJ SOLN
INTRAMUSCULAR | Status: AC
  Filled 2020-06-29: qty 5

## 2020-06-29 MED ORDER — SODIUM CHLORIDE 0.9% FLUSH: 3.0000 mL | INTRAVENOUS | Status: DC | PRN

## 2020-06-29 MED ORDER — BUPIVACAINE HCL (PF) 0.5 % IJ SOLN
INTRAMUSCULAR | Status: DC | PRN
  Administered 2020-06-29: 25 mL
  Administered 2020-06-29: 5 mL

## 2020-06-29 MED ORDER — HYDROCODONE-ACETAMINOPHEN 5-325 MG PO TABS: 1.0000 | ORAL_TABLET | ORAL | Status: DC | PRN

## 2020-06-29 MED ORDER — MENTHOL 3 MG MT LOZG: 1.0000 | LOZENGE | OROMUCOSAL | Status: DC | PRN

## 2020-06-29 MED ORDER — SENNA 8.6 MG PO TABS
1.0000 | ORAL_TABLET | Freq: Two times a day (BID) | ORAL | Status: DC
  Administered 2020-06-29 – 2020-06-30 (×3): 8.6 mg via ORAL
  Filled 2020-06-29 (×3): qty 1

## 2020-06-29 MED ORDER — METHOCARBAMOL 500 MG PO TABS
500.0000 mg | ORAL_TABLET | Freq: Three times a day (TID) | ORAL | Status: DC
  Administered 2020-06-29 – 2020-06-30 (×5): 500 mg via ORAL
  Filled 2020-06-29 (×5): qty 1

## 2020-06-29 MED ORDER — FENOFIBRATE 160 MG PO TABS
160.0000 mg | ORAL_TABLET | Freq: Every day | ORAL | Status: DC
  Administered 2020-06-29: 160 mg via ORAL
  Filled 2020-06-29 (×2): qty 1

## 2020-06-29 MED ORDER — LORATADINE 10 MG PO TABS
10.0000 mg | ORAL_TABLET | Freq: Every day | ORAL | Status: DC
  Administered 2020-06-30: 10 mg via ORAL
  Filled 2020-06-29: qty 1

## 2020-06-29 MED ORDER — HYDROMORPHONE HCL 1 MG/ML IJ SOLN
0.2500 mg | INTRAMUSCULAR | Status: DC | PRN
  Administered 2020-06-29: 0.25 mg via INTRAVENOUS
  Administered 2020-06-29: 0.5 mg via INTRAVENOUS

## 2020-06-29 MED ORDER — AMISULPRIDE (ANTIEMETIC) 5 MG/2ML IV SOLN
5.0000 mg | Freq: Once | INTRAVENOUS | Status: AC
  Administered 2020-06-29: 5 mg via INTRAVENOUS

## 2020-06-29 MED ORDER — ONDANSETRON HCL 4 MG/2ML IJ SOLN: 4.0000 mg | Freq: Once | INTRAMUSCULAR | Status: DC

## 2020-06-29 MED ORDER — ROCURONIUM BROMIDE 10 MG/ML (PF) SYRINGE
PREFILLED_SYRINGE | INTRAVENOUS | Status: DC | PRN
  Administered 2020-06-29: 60 mg via INTRAVENOUS
  Administered 2020-06-29: 20 mg via INTRAVENOUS
  Administered 2020-06-29: 40 mg via INTRAVENOUS

## 2020-06-29 MED ORDER — ONDANSETRON HCL 4 MG/2ML IJ SOLN
INTRAMUSCULAR | Status: AC
  Filled 2020-06-29: qty 2

## 2020-06-29 MED ORDER — LACTATED RINGERS IV SOLN: INTRAVENOUS | Status: DC

## 2020-06-29 MED ORDER — POLYETHYLENE GLYCOL 3350 17 G PO PACK: 17.0000 g | PACK | Freq: Every day | ORAL | Status: DC | PRN

## 2020-06-29 MED ORDER — METHOCARBAMOL 1000 MG/10ML IJ SOLN
500.0000 mg | Freq: Four times a day (QID) | INTRAVENOUS | Status: DC | PRN
  Filled 2020-06-29: qty 5

## 2020-06-29 MED ORDER — FLEET ENEMA 7-19 GM/118ML RE ENEM: 1.0000 | ENEMA | Freq: Once | RECTAL | Status: DC | PRN

## 2020-06-29 MED ORDER — METOPROLOL TARTRATE 25 MG PO TABS
25.0000 mg | ORAL_TABLET | Freq: Two times a day (BID) | ORAL | Status: DC
  Administered 2020-06-29 – 2020-06-30 (×3): 25 mg via ORAL
  Filled 2020-06-29 (×3): qty 1

## 2020-06-29 MED ORDER — FUROSEMIDE 40 MG PO TABS
40.0000 mg | ORAL_TABLET | Freq: Every day | ORAL | Status: DC
  Administered 2020-06-29: 40 mg via ORAL
  Filled 2020-06-29: qty 1

## 2020-06-29 MED ORDER — PHENYLEPHRINE HCL-NACL 10-0.9 MG/250ML-% IV SOLN
INTRAVENOUS | Status: DC | PRN
  Administered 2020-06-29: 10 ug/min via INTRAVENOUS

## 2020-06-29 MED ORDER — THROMBIN 5000 UNITS EX SOLR
OROMUCOSAL | Status: DC | PRN
  Administered 2020-06-29: 5 mL via TOPICAL

## 2020-06-29 MED ORDER — ALBUMIN HUMAN 5 % IV SOLN: INTRAVENOUS | Status: DC | PRN

## 2020-06-29 MED ORDER — INSULIN ASPART 100 UNIT/ML IJ SOLN
0.0000 [IU] | Freq: Three times a day (TID) | INTRAMUSCULAR | Status: DC
  Administered 2020-06-29: 4 [IU] via SUBCUTANEOUS
  Administered 2020-06-30 (×2): 3 [IU] via SUBCUTANEOUS

## 2020-06-29 MED ORDER — PHENOL 1.4 % MT LIQD: 1.0000 | OROMUCOSAL | Status: DC | PRN

## 2020-06-29 MED ORDER — LEVOTHYROXINE SODIUM 25 MCG PO TABS
125.0000 ug | ORAL_TABLET | Freq: Every day | ORAL | Status: DC
  Administered 2020-06-30 – 2020-07-01 (×2): 125 ug via ORAL
  Filled 2020-06-29 (×2): qty 1

## 2020-06-29 MED ORDER — SODIUM CHLORIDE 0.9% FLUSH: 3.0000 mL | Freq: Two times a day (BID) | INTRAVENOUS | Status: DC

## 2020-06-29 MED ORDER — ACETAMINOPHEN 650 MG RE SUPP: 650.0000 mg | RECTAL | Status: DC | PRN

## 2020-06-29 MED ORDER — GABAPENTIN 600 MG PO TABS
600.0000 mg | ORAL_TABLET | Freq: Three times a day (TID) | ORAL | Status: DC
  Administered 2020-06-29 – 2020-06-30 (×5): 600 mg via ORAL
  Filled 2020-06-29 (×5): qty 1

## 2020-06-29 MED ORDER — SUGAMMADEX SODIUM 200 MG/2ML IV SOLN
INTRAVENOUS | Status: DC | PRN
  Administered 2020-06-29: 200 mg via INTRAVENOUS

## 2020-06-29 MED ORDER — CEFAZOLIN SODIUM-DEXTROSE 2-4 GM/100ML-% IV SOLN
2.0000 g | Freq: Three times a day (TID) | INTRAVENOUS | Status: AC
  Administered 2020-06-29 – 2020-06-30 (×2): 2 g via INTRAVENOUS
  Filled 2020-06-29 (×2): qty 100

## 2020-06-29 MED ORDER — ONDANSETRON HCL 4 MG PO TABS
4.0000 mg | ORAL_TABLET | Freq: Four times a day (QID) | ORAL | Status: DC | PRN
  Administered 2020-07-01: 4 mg via ORAL
  Filled 2020-06-29: qty 1

## 2020-06-29 MED ORDER — THROMBIN 5000 UNITS EX SOLR
CUTANEOUS | Status: AC
  Filled 2020-06-29: qty 5000

## 2020-06-29 MED ORDER — EPHEDRINE SULFATE-NACL 50-0.9 MG/10ML-% IV SOSY
PREFILLED_SYRINGE | INTRAVENOUS | Status: DC | PRN
  Administered 2020-06-29: 5 mg via INTRAVENOUS

## 2020-06-29 MED ORDER — LIDOCAINE 2% (20 MG/ML) 5 ML SYRINGE
INTRAMUSCULAR | Status: DC | PRN
  Administered 2020-06-29: 60 mg via INTRAVENOUS

## 2020-06-29 MED ORDER — MIDAZOLAM HCL 5 MG/5ML IJ SOLN
INTRAMUSCULAR | Status: DC | PRN
  Administered 2020-06-29: 2 mg via INTRAVENOUS

## 2020-06-29 MED ORDER — PROPOFOL 10 MG/ML IV BOLUS
INTRAVENOUS | Status: DC | PRN
  Administered 2020-06-29: 100 mg via INTRAVENOUS

## 2020-06-29 MED ORDER — ALUM & MAG HYDROXIDE-SIMETH 200-200-20 MG/5ML PO SUSP: 30.0000 mL | Freq: Four times a day (QID) | ORAL | Status: DC | PRN

## 2020-06-29 MED ORDER — HYDROMORPHONE HCL 1 MG/ML IJ SOLN
INTRAMUSCULAR | Status: AC
  Filled 2020-06-29: qty 1

## 2020-06-29 MED ORDER — TRAMADOL HCL 50 MG PO TABS: 50.0000 mg | ORAL_TABLET | Freq: Four times a day (QID) | ORAL | Status: DC | PRN

## 2020-06-29 MED ORDER — MIDAZOLAM HCL 2 MG/2ML IJ SOLN
INTRAMUSCULAR | Status: AC
  Filled 2020-06-29: qty 2

## 2020-06-29 MED ORDER — DEXAMETHASONE SODIUM PHOSPHATE 10 MG/ML IJ SOLN
INTRAMUSCULAR | Status: DC | PRN
  Administered 2020-06-29: 10 mg via INTRAVENOUS

## 2020-06-29 MED ORDER — LIDOCAINE-EPINEPHRINE 1 %-1:100000 IJ SOLN
INTRAMUSCULAR | Status: DC | PRN
  Administered 2020-06-29: 5 mL

## 2020-06-29 MED ORDER — TRAMADOL HCL 50 MG PO TABS
50.0000 mg | ORAL_TABLET | Freq: Four times a day (QID) | ORAL | Status: DC | PRN
  Administered 2020-06-29 – 2020-06-30 (×2): 100 mg via ORAL
  Filled 2020-06-29 (×2): qty 2

## 2020-06-29 MED ORDER — THROMBIN 20000 UNITS EX SOLR
CUTANEOUS | Status: DC | PRN
  Administered 2020-06-29: 20 mL via TOPICAL

## 2020-06-29 MED ORDER — CEFAZOLIN SODIUM-DEXTROSE 2-4 GM/100ML-% IV SOLN
2.0000 g | INTRAVENOUS | Status: AC
  Administered 2020-06-29 (×2): 2 g via INTRAVENOUS
  Filled 2020-06-29: qty 100

## 2020-06-29 MED ORDER — MORPHINE SULFATE (PF) 2 MG/ML IV SOLN
2.0000 mg | INTRAVENOUS | Status: DC | PRN
  Administered 2020-06-29 (×2): 2 mg via INTRAVENOUS
  Filled 2020-06-29: qty 1
  Filled 2020-06-29: qty 2

## 2020-06-29 MED ORDER — EPHEDRINE 5 MG/ML INJ
INTRAVENOUS | Status: AC
  Filled 2020-06-29: qty 10

## 2020-06-29 MED ORDER — KETOROLAC TROMETHAMINE 15 MG/ML IJ SOLN
15.0000 mg | Freq: Four times a day (QID) | INTRAMUSCULAR | Status: AC
  Administered 2020-06-29 – 2020-06-30 (×3): 15 mg via INTRAVENOUS
  Filled 2020-06-29 (×3): qty 1

## 2020-06-29 MED ORDER — CHLORHEXIDINE GLUCONATE 0.12 % MT SOLN
15.0000 mL | Freq: Once | OROMUCOSAL | Status: AC
  Administered 2020-06-29: 15 mL via OROMUCOSAL
  Filled 2020-06-29: qty 15

## 2020-06-29 MED ORDER — ONDANSETRON HCL 4 MG/2ML IJ SOLN: 4.0000 mg | Freq: Four times a day (QID) | INTRAMUSCULAR | Status: DC | PRN

## 2020-06-29 MED ORDER — ACETAMINOPHEN 325 MG PO TABS: 650.0000 mg | ORAL_TABLET | ORAL | Status: DC | PRN

## 2020-06-29 MED ORDER — HYDROXYZINE HCL 50 MG/ML IM SOLN
50.0000 mg | Freq: Four times a day (QID) | INTRAMUSCULAR | Status: DC | PRN
  Administered 2020-06-29: 50 mg via INTRAMUSCULAR
  Filled 2020-06-29: qty 1

## 2020-06-29 MED ORDER — DOCUSATE SODIUM 100 MG PO CAPS
100.0000 mg | ORAL_CAPSULE | Freq: Two times a day (BID) | ORAL | Status: DC
  Administered 2020-06-29 – 2020-06-30 (×3): 100 mg via ORAL
  Filled 2020-06-29 (×3): qty 1

## 2020-06-29 MED ORDER — AMISULPRIDE (ANTIEMETIC) 5 MG/2ML IV SOLN
INTRAVENOUS | Status: AC
  Filled 2020-06-29: qty 2

## 2020-06-29 MED ORDER — PHENYLEPHRINE 40 MCG/ML (10ML) SYRINGE FOR IV PUSH (FOR BLOOD PRESSURE SUPPORT)
PREFILLED_SYRINGE | INTRAVENOUS | Status: DC | PRN
  Administered 2020-06-29: 80 ug via INTRAVENOUS

## 2020-06-29 MED ORDER — PROPOFOL 10 MG/ML IV BOLUS
INTRAVENOUS | Status: AC
  Filled 2020-06-29: qty 20

## 2020-06-29 MED ORDER — BUPIVACAINE HCL (PF) 0.5 % IJ SOLN
INTRAMUSCULAR | Status: AC
  Filled 2020-06-29: qty 30

## 2020-06-29 MED ORDER — METHOCARBAMOL 500 MG PO TABS
500.0000 mg | ORAL_TABLET | Freq: Four times a day (QID) | ORAL | Status: DC | PRN
  Administered 2020-06-29 – 2020-07-01 (×3): 500 mg via ORAL
  Filled 2020-06-29 (×2): qty 1

## 2020-06-29 MED ORDER — FENTANYL CITRATE (PF) 100 MCG/2ML IJ SOLN
INTRAMUSCULAR | Status: DC | PRN
  Administered 2020-06-29 (×6): 25 ug via INTRAVENOUS
  Administered 2020-06-29: 100 ug via INTRAVENOUS

## 2020-06-29 MED ORDER — LIDOCAINE-EPINEPHRINE 1 %-1:100000 IJ SOLN
INTRAMUSCULAR | Status: AC
  Filled 2020-06-29: qty 1

## 2020-06-29 MED ORDER — 0.9 % SODIUM CHLORIDE (POUR BTL) OPTIME
TOPICAL | Status: DC | PRN
  Administered 2020-06-29: 1000 mL

## 2020-06-29 SURGICAL SUPPLY — 68 items
APL SRG 60D 8 XTD TIP BNDBL (TIP)
BASKET BONE COLLECTION (BASKET) ×2 IMPLANT
BLADE CLIPPER SURG (BLADE) IMPLANT
BONE CANC CHIPS 20CC PCAN1/4 (Bone Implant) ×2 IMPLANT
BUR MATCHSTICK NEURO 3.0 LAGG (BURR) ×2 IMPLANT
CAGE PLIF 8X9X23-12 LUMBAR (Cage) ×4 IMPLANT
CANISTER SUCT 3000ML PPV (MISCELLANEOUS) ×2 IMPLANT
CHIPS CANC BONE 20CC PCAN1/4 (Bone Implant) ×1 IMPLANT
CNTNR URN SCR LID CUP LEK RST (MISCELLANEOUS) ×1 IMPLANT
CONNECTOR RELINE 20MM OPEN OFF (Connector) ×2 IMPLANT
CONNECTOR RELINE 30MM OPEN OFF (Connector) ×4 IMPLANT
CONT SPEC 4OZ STRL OR WHT (MISCELLANEOUS) ×2
COVER BACK TABLE 60X90IN (DRAPES) ×2 IMPLANT
COVER WAND RF STERILE (DRAPES) ×2 IMPLANT
DECANTER SPIKE VIAL GLASS SM (MISCELLANEOUS) ×4 IMPLANT
DERMABOND ADVANCED (GAUZE/BANDAGES/DRESSINGS) ×1
DERMABOND ADVANCED .7 DNX12 (GAUZE/BANDAGES/DRESSINGS) ×1 IMPLANT
DEVICE DISSECT PLASMABLAD 3.0S (MISCELLANEOUS) ×2 IMPLANT
DRAPE C-ARM 42X72 X-RAY (DRAPES) ×4 IMPLANT
DRAPE C-ARMOR (DRAPES) ×2 IMPLANT
DRAPE HALF SHEET 40X57 (DRAPES) IMPLANT
DRAPE LAPAROTOMY 100X72X124 (DRAPES) ×2 IMPLANT
DRSG OPSITE POSTOP 4X8 (GAUZE/BANDAGES/DRESSINGS) ×2 IMPLANT
DURAPREP 26ML APPLICATOR (WOUND CARE) ×2 IMPLANT
DURASEAL APPLICATOR TIP (TIP) IMPLANT
DURASEAL SPINE SEALANT 3ML (MISCELLANEOUS) IMPLANT
ELECT REM PT RETURN 9FT ADLT (ELECTROSURGICAL) ×2
ELECTRODE REM PT RTRN 9FT ADLT (ELECTROSURGICAL) ×1 IMPLANT
GAUZE 4X4 16PLY RFD (DISPOSABLE) IMPLANT
GAUZE SPONGE 4X4 12PLY STRL (GAUZE/BANDAGES/DRESSINGS) ×2 IMPLANT
GLOVE BIOGEL PI IND STRL 8.5 (GLOVE) ×2 IMPLANT
GLOVE BIOGEL PI INDICATOR 8.5 (GLOVE) ×2
GLOVE ECLIPSE 8.5 STRL (GLOVE) ×4 IMPLANT
GOWN STRL REUS W/ TWL LRG LVL3 (GOWN DISPOSABLE) IMPLANT
GOWN STRL REUS W/ TWL XL LVL3 (GOWN DISPOSABLE) IMPLANT
GOWN STRL REUS W/TWL 2XL LVL3 (GOWN DISPOSABLE) ×4 IMPLANT
GOWN STRL REUS W/TWL LRG LVL3 (GOWN DISPOSABLE)
GOWN STRL REUS W/TWL XL LVL3 (GOWN DISPOSABLE)
GRAFT BONE PROTEIOS MED 2.5CC (Orthopedic Implant) ×2 IMPLANT
GRAFT DURAGEN MATRIX 1WX1L (Tissue) ×2 IMPLANT
HEMOSTAT POWDER KIT SURGIFOAM (HEMOSTASIS) ×2 IMPLANT
KIT BASIN OR (CUSTOM PROCEDURE TRAY) ×2 IMPLANT
KIT TURNOVER KIT B (KITS) ×2 IMPLANT
MILL MEDIUM DISP (BLADE) ×2 IMPLANT
NEEDLE HYPO 22GX1.5 SAFETY (NEEDLE) ×2 IMPLANT
NS IRRIG 1000ML POUR BTL (IV SOLUTION) ×2 IMPLANT
PACK LAMINECTOMY NEURO (CUSTOM PROCEDURE TRAY) ×2 IMPLANT
PAD ARMBOARD 7.5X6 YLW CONV (MISCELLANEOUS) ×6 IMPLANT
PATTIES SURGICAL .5 X1 (DISPOSABLE) ×2 IMPLANT
PLASMABLADE 3.0S (MISCELLANEOUS) ×4
ROD RELINE 0-0 CON M 5.0/6.0MM (Rod) ×4 IMPLANT
ROD RELINE 5.5X90MM LORDOTIC (Rod) ×4 IMPLANT
SCREW LOCK RELINE 5.5 TULIP (Screw) ×22 IMPLANT
SCREW RELINE-O POLY 6.5X45 (Screw) ×4 IMPLANT
SCREW RELINE-O POLY 8.5C70MM (Screw) ×4 IMPLANT
SPONGE LAP 4X18 RFD (DISPOSABLE) IMPLANT
SPONGE SURGIFOAM ABS GEL 100 (HEMOSTASIS) ×2 IMPLANT
SUT PROLENE 6 0 BV (SUTURE) ×2 IMPLANT
SUT VIC AB 1 CT1 18XBRD ANBCTR (SUTURE) ×1 IMPLANT
SUT VIC AB 1 CT1 8-18 (SUTURE) ×2
SUT VIC AB 2-0 CP2 18 (SUTURE) ×2 IMPLANT
SUT VIC AB 3-0 SH 8-18 (SUTURE) ×2 IMPLANT
SUT VIC AB 4-0 RB1 18 (SUTURE) ×2 IMPLANT
SYR 3ML LL SCALE MARK (SYRINGE) ×8 IMPLANT
TOWEL GREEN STERILE (TOWEL DISPOSABLE) ×2 IMPLANT
TOWEL GREEN STERILE FF (TOWEL DISPOSABLE) ×2 IMPLANT
TRAY FOLEY MTR SLVR 16FR STAT (SET/KITS/TRAYS/PACK) ×2 IMPLANT
WATER STERILE IRR 1000ML POUR (IV SOLUTION) ×2 IMPLANT

## 2020-06-29 NOTE — H&P (Addendum)
Caitlin Ho is an 65 y.o. female.   Chief Complaint: Back and bilateral leg pain HPI: Caitlin Ho is a 65 year old patient who has had extensive spine surgery in the past.  He has evolved degenerative changes at L5-S1 level.  He has significant lateral recess stenosis.  20 cm advised surgery will include pressure on stabilization of L5-S1 With attachment to the sacrum and the ilium with iliac crest screws.  Past Medical History:  Diagnosis Date  . Cancer ([place])    cervical cancer cells whwn patient was 22   . Cervicalgia   . Fibromyalgia   . GERD (gastroesophageal reflux disease)    not on medication  . Hashimoto's disease   . Headache(784.0)    sinus  . Homonymous hemianopia   . Hyperlipidemia   . Hypertension   . Hypothyroid   . Other insomnia   . Other sequelae of cerebral infarction   . Palpitations   . Pneumonia   . PONV (postoperative nausea and vomiting)    06/20/14 patient reported that BP dropped real low and she had N/V  and was slow to awaken when she had surgery at an outpatient center.  . Pre-diabetes   . Prediabetes   . Seasonal allergies   . Sleep apnea    no CPAP  . Stroke ([place])   . Supraventricular tachycardia ([place])   . Vitamin D deficiency     Past Surgical History:  Procedure Laterality Date  . ABDOMINAL HYSTERECTOMY    . APPENDECTOMY  1978  . BACK SURGERY    . CERVICAL FUSION  2017   2 levels  . DILATION AND CURETTAGE OF UTERUS    . LUMBAR DISC SURGERY  10/03/2012   Dr Ellene Route  2 Discectomy  . [place] SURGERY  07/2012  . LUMBAR FUSION  2015   Fusion (2 levels)  . LUMBAR FUSION  2016   fusion 1 level.  . LUMBAR FUSION  02/16/2013   Fusion lumbar.  . THYROIDECTOMY  2006  . THYROIDECTOMY  2006  . TUBAL LIGATION      Family History  Problem Relation Age of Onset  . Aneurysm Mother        brain  . Heart attack Father 72  . Cancer Paternal Grandmother        Breast  . Hyperlipidemia Other   . Hypertension Other   . Diabetes Other     Social History:  reports that she has never smoked. She has never used smokeless tobacco. She reports that she does not drink alcohol and does not use drugs.  Allergies:  Allergies  Allergen Reactions  . Lipitor [Atorvastatin]     Myalgia  . Pravastatin     Joint pain    Medications Prior to Admission  Medication Sig Dispense Refill  . aspirin EC 81 MG tablet Take 81 mg by mouth daily.    . cetirizine (ZYRTEC) 10 MG tablet Take 10 mg by mouth daily as needed for allergies.    . fenofibrate 160 MG tablet TAKE 1 TABLET EVERY DAY (Patient taking differently: Take 160 mg by mouth daily.) 90 tablet 2  . furosemide (LASIX) 40 MG tablet TAKE 1 TABLET EVERY DAY (Patient taking differently: Take 40 mg by mouth daily.) 90 tablet 1  . gabapentin (NEURONTIN) 600 MG tablet TAKE 1 TABLET THREE TIMES DAILY (Patient taking differently: Take 600 mg by mouth 3 (three) times daily.) 270 tablet 1  . HYDROcodone-acetaminophen (NORCO/VICODIN) 5-325 MG tablet Take 1 tablet by mouth  every 8 (eight) hours as needed for pain.    Marland Kitchen levothyroxine (SYNTHROID) 125 MCG tablet TAKE 1 TABLET EVERY DAY BEFORE BREAKFAST (Patient taking differently: Take 125 mcg by mouth daily before breakfast.) 90 tablet 1  . methocarbamol (ROBAXIN) 500 MG tablet TAKE 1 TABLET THREE TIMES DAILY (Patient taking differently: Take 500 mg by mouth 3 (three) times daily.) 270 tablet 0  . metoprolol tartrate (LOPRESSOR) 25 MG tablet TAKE 1 TABLET TWICE DAILY (Patient taking differently: Take 25 mg by mouth 2 (two) times daily.) 180 tablet 1  . omeprazole (PRILOSEC) 40 MG capsule TAKE 1 CAPSULE EVERY DAY (Patient taking differently: Take 40 mg by mouth daily.) 90 capsule 0  . polyethylene glycol (MIRALAX / GLYCOLAX) 17 g packet Take 17 g by mouth daily as needed for moderate constipation.    . traMADol (ULTRAM) 50 MG tablet TAKE 2 TABLETS (100 MG TOTAL) BY MOUTH IN THE MORNING, AT NOON, AND AT BEDTIME. 180 tablet 2  . Vitamin D, Ergocalciferol,  (DRISDOL) 1.25 MG (50000 UNIT) CAPS capsule TAKE 1 CAPSULE BY MOUTH TWICE WEEKLY (Patient not taking: No sig reported) 24 capsule 1    Results for orders placed or performed during the hospital encounter of 06/29/20 (from the past 48 hour(s))  Glucose, capillary     Status: Abnormal   Collection Time: 06/29/20  6:17 AM  Result Value Ref Range   Glucose-Capillary 115 (H) 70 - 99 mg/dL    Comment: Glucose reference range applies only to samples taken after fasting for at least 8 hours.   No results found.  Review of Systems  Constitutional: Positive for activity change.  HENT: Negative.   Eyes: Negative.   Respiratory: Negative.   Cardiovascular: Negative.   Gastrointestinal: Negative.   Endocrine: Negative.   Genitourinary: Negative.   Musculoskeletal: Positive for back pain and gait problem.  Allergic/Immunologic: Negative.   Neurological: Positive for weakness.  Hematological: Negative.   Psychiatric/Behavioral: Negative.     Blood pressure 125/77, pulse 73, temperature (!) 97.3 F (36.3 C), temperature source Oral, resp. rate 17, height 5\' 5"  (1.651 m), weight 97.5 kg, SpO2 95 %. Physical Exam Constitutional:      Appearance: Normal appearance. She is obese.  HENT:     Head: Normocephalic and atraumatic.     Nose: Nose normal.     Mouth/Throat:     Mouth: Mucous membranes are moist.  Eyes:     Extraocular Movements: Extraocular movements intact.     Pupils: Pupils are equal, round, and reactive to light.  Cardiovascular:     Rate and Rhythm: Normal rate and regular rhythm.     Pulses: Normal pulses.     Heart sounds: Normal heart sounds.  Pulmonary:     Effort: Pulmonary effort is normal.     Breath sounds: Normal breath sounds.  Abdominal:     General: Abdomen is flat.     Palpations: Abdomen is soft.  Musculoskeletal:     Cervical back: Normal range of motion.     Comments: Positive straight leg raising 15 degrees  Extremity tear Patrick's maneuver Negative   Skin:    General: Skin is warm and dry.     Capillary Refill: Capillary refill takes less than 2 seconds.  Neurological:     Mental Status: She is alert.     Comments: Motor strength is intact in the iliopsoas and the quadriceps.  Tibialis anterior is 4 out of 5 strength on the right 5 out of 5 on  the left.  Gastroc strength 4/5 on the left 5 out of 5 on the right.  Deep tendon reflexes are absent in both patellae both Achilles.  Upper extremity strength and reflexes normal cranial nerve examination normal.  Psychiatric:        Mood and Affect: Mood normal.        Behavior: Behavior normal.        Thought Content: Thought content normal.      Assessment/Plan Spondylolisthesis and stenosis L5-S1.  History of fusion L3-L5  Plan: Decompression fusion at the L5-S1 with fixation to the ileum  Earleen Newport, MD 06/29/2020, 7:22 AM

## 2020-06-29 NOTE — Progress Notes (Signed)
Orthopedic Tech Progress Note Patient Details:  Caitlin Ho January 17, 1956 [id number] Patient has brace Patient ID: Caitlin Ho, female   DOB: [birth year], 65 y.o.   MRN: [id number]   Caitlin Ho 06/29/2020, 8:01 PM

## 2020-06-29 NOTE — Transfer of Care (Signed)
Immediate Anesthesia Transfer of Care Note  Patient: Caitlin Ho  Procedure(s) Performed: Lumbar Five- Sacral One Posterior lumbar interbody fusion with fixation to pelvis (N/A Back)  Patient Location: PACU  Anesthesia Type:General  Level of Consciousness: drowsy and patient cooperative  Airway & Oxygen Therapy: Patient Spontanous Breathing and Patient connected to face mask oxygen  Post-op Assessment: Report given to RN and Post -op Vital signs reviewed and stable  Post vital signs: Reviewed and stable  Last Vitals:  Vitals Value Taken Time  BP 147/86 06/29/20 1251  Temp    Pulse 83 06/29/20 1254  Resp 16 06/29/20 1254  SpO2 91 % 06/29/20 1254  Vitals shown include unvalidated device data.  Last Pain:  Vitals:   06/29/20 0639  TempSrc:   PainSc: 7       Patients Stated Pain Goal: 5 (79/39/03 0092)  Complications: No complications documented.

## 2020-06-29 NOTE — Anesthesia Procedure Notes (Signed)
Procedure Name: Intubation Date/Time: 06/29/2020 8:02 AM Performed by: Georgia Duff, CRNA Pre-anesthesia Checklist: Patient identified, Emergency Drugs available, Suction available and Patient being monitored Patient Re-evaluated:Patient Re-evaluated prior to induction Oxygen Delivery Method: Circle System Utilized Preoxygenation: Pre-oxygenation with 100% oxygen Induction Type: IV induction Ventilation: Mask ventilation without difficulty Laryngoscope Size: Miller and 2 Grade View: Grade I Tube type: Oral Tube size: 7.0 mm Number of attempts: 1 Airway Equipment and Method: Stylet and Oral airway Placement Confirmation: ETT inserted through vocal cords under direct vision,  positive ETCO2 and breath sounds checked- equal and bilateral Secured at: 22 cm Tube secured with: Tape Dental Injury: Teeth and Oropharynx as per pre-operative assessment

## 2020-06-29 NOTE — Op Note (Signed)
Date of surgery: 06/29/2020 Preoperative diagnosis: Spondylosis and stenosis L5-S1 history of fusion L2-L5.  Lumbar radiculopathy, neurogenic claudication. Postoperative diagnosis: Same Procedure: L5-S1 bilateral laminectomy and decompression of the L5 and S1 nerve roots.  Posterior lumbar interbody arthrodesis with peek spacers local autograft and allograft with Proteus.  Pedicle screw fixation to the sacrum and the ilium from previous L2-L5 fusion. Surgeon: Kristeen Miss First Assistant: Duffy Rhody, MD Anesthesia: General endotracheal Indications: Caitlin Ho is a 65 year old individual who has had previous decompression fusion from L2-L5.  She has evidence of adjacent level at L5-S1 with severely hypertrophied facet joints causing lateral recess stenosis for both the L5 and S1 nerve roots.  After careful consideration of her options we discussed surgical decompression and stabilization which should include fixation to the pelvis with iliac screws.  She is now admitted for that procedure.  Procedure: Patient was brought to the operating room supine on the stretcher.  After the smooth induction of general endotracheal anesthesia she was carefully turned prone.  The bony prominences were appropriately padded and protected and the back was prepped with alcohol DuraPrep and draped in a sterile fashion.  Previously made lower lumbar incision was then reopened and the dissection was carried down to the lumbodorsal fascia.  Fascia was opened on either side of midline to expose the lower end of the hardware at the L4 and L5 levels.  Initially no dissection was carried out over the hardware and the L5-S1 space was opened also exposing out to the sacral ala.  Self-retaining retractor was placed deep in the wound.  Then a laminectomy of L5 was performed removing the inferior margin lamina of L5 out to and including the entirety of the facet joint at the L5-S1 level.  Bone was removed and saved for later use as  autograft.  The thickened redundant yellow ligament was taken up and the dissection was carried cephalad to expose common dural tube to the takeoff of the L5 nerve root.  The path the L5 nerve root was decompressed as travel at the foramen.  The disc space was then isolated at L5-S1 and the S1 nerve root was similarly decompressed and mobilized medially.  With the disc space being isolated there was noted at this time to be a tear in the posterior longitudinal ligament with a disc fragment on the left side at L5-S1.  This was removed.  The dissection was then extended into the disc space and a complete discectomy was performed at L5-S1 using a series of shavers curettes and rongeurs to evacuate substantial quantities of severely degenerated disc material the disc space spreader was used to distract the interspace and ultimately it was felt that a 9 x 23 mm spacer with 12 degrees of lordosis implant does not this interval.  A total of 20 cc of autograft allograft and Proteus was placed into the interspace along with a 2 spacers.  Next pedicle entry sites were chosen at the S1 level and 6.5 x 45 mm pedicle screws were placed into the pedicles after checking fluoroscopically the position.  Iliac screws were then placed with 8.5 x 70 mm screws placed into each iliac crest after creating a proximal in the posterior suprailiac crest region.  The bone was probed bluntly and the positioning was checked radiographically before placing screws.  Once the screws were placed side connectors were used.  The construct from the ileum to a side connector placed in the previous construct between the L4 and L5 screws.  A  90 mm precontoured rod was used for this purpose.  The knee rod was placed in the saddles between the iliac S1 and SideArm connector.  This tightened in a neutral construct.  Final radiographs were obtained in AP and lateral projection identifying good position of the hardware.  Lateral gutters which had been  previously decorticated between L5 and the sacral ala was then packed with an additional 12 cc of bone graft.  After final radiographs were obtained the area was checked for hemostasis a small dural encouragement on the dorsum of the spinal canal was closed with a singular figure-of-eight Prolene suture over a small pledget of DuraGen.  No CSF leaks were noted.  25 cc of half percent Marcaine was injected into the paraspinous fascia and the retractors were removed care was taken to make sure that the L5 and S1 nerve roots were well decompressed and then the lumbodorsal fascia was closed with #1 Vicryl in interrupted fashion 2-0 Vicryl in the subcutaneous tissues and 3-0 Vicryl subcuticularly.  Dermabond was placed on the skin.  Loss for the procedure was estimated at 400 cc

## 2020-06-29 NOTE — Anesthesia Postprocedure Evaluation (Signed)
Anesthesia Post Note  Patient: Caitlin Ho  Procedure(s) Performed: Lumbar Five- Sacral One Posterior lumbar interbody fusion with fixation to pelvis (N/A Back)     Patient location during evaluation: PACU Anesthesia Type: General Level of consciousness: awake and alert Pain management: pain level controlled Vital Signs Assessment: post-procedure vital signs reviewed and stable Respiratory status: spontaneous breathing, nonlabored ventilation and respiratory function stable Cardiovascular status: blood pressure returned to baseline and stable Postop Assessment: no apparent nausea or vomiting Anesthetic complications: no   No complications documented.  Last Vitals:  Vitals:   06/29/20 1350 06/29/20 1420  BP: 126/70 122/68  Pulse: 73 70  Resp: 14 13  Temp: 36.4 C   SpO2: 91% 94%    Last Pain:  Vitals:   06/29/20 1406  TempSrc:   PainSc: Asleep                 Aalina Brege,W. EDMOND

## 2020-06-30 LAB — CBC
HCT: 35.6 % — ABNORMAL LOW (ref 36.0–46.0)
Hemoglobin: 11.1 g/dL — ABNORMAL LOW (ref 12.0–15.0)
MCH: 29.4 pg (ref 26.0–34.0)
MCHC: 31.2 g/dL (ref 30.0–36.0)
MCV: 94.2 fL (ref 80.0–100.0)
Platelets: 270 10*3/uL (ref 150–400)
RBC: 3.78 MIL/uL — ABNORMAL LOW (ref 3.87–5.11)
RDW: 13.2 % (ref 11.5–15.5)
WBC: 14 10*3/uL — ABNORMAL HIGH (ref 4.0–10.5)
nRBC: 0 % (ref 0.0–0.2)

## 2020-06-30 LAB — GLUCOSE, CAPILLARY
Glucose-Capillary: 97 mg/dL (ref 70–99)
Glucose-Capillary: 121 mg/dL — ABNORMAL HIGH (ref 70–99)
Glucose-Capillary: 127 mg/dL — ABNORMAL HIGH (ref 70–99)
Glucose-Capillary: 128 mg/dL — ABNORMAL HIGH (ref 70–99)

## 2020-06-30 LAB — BASIC METABOLIC PANEL
Anion gap: 8 (ref 5–15)
BUN: 9 mg/dL (ref 8–23)
CO2: 29 mmol/L (ref 22–32)
Calcium: 8.6 mg/dL — ABNORMAL LOW (ref 8.9–10.3)
Chloride: 103 mmol/L (ref 98–111)
Creatinine, Ser: 0.91 mg/dL (ref 0.44–1.00)
GFR, Estimated: >60 mL/min (ref >60)
Glucose, Bld: 109 mg/dL — ABNORMAL HIGH (ref 70–99)
Potassium: 4.5 mmol/L (ref 3.5–5.1)
Sodium: 140 mmol/L (ref 135–145)

## 2020-06-30 MED ORDER — HYDROCODONE-ACETAMINOPHEN 5-325 MG PO TABS
1.0000 | ORAL_TABLET | ORAL | Status: DC | PRN
Start: 2020-06-30 — End: 2020-07-01
  Administered 2020-06-30 – 2020-07-01 (×7): 2 via ORAL
  Filled 2020-06-30 (×7): qty 2

## 2020-06-30 NOTE — Progress Notes (Signed)
Patient ID: Caitlin Ho, female   DOB: 1955-05-03, 65 y.o.   MRN: [id number] Vital signs are stable Patient notes that her leg on the right side feels somewhat better than it did preoperatively She is mobilizing very slowly Incision is clean and dry Postoperative laboratories demonstrate her hematocrit is 35 electrolytes are normal and her renal function is good We are hopeful for discharge tomorrow Dr. Kathyrn Sheriff will be seen her in my absence.  Stable postop

## 2020-06-30 NOTE — Evaluation (Signed)
Occupational Therapy Evaluation Patient Details Name: Caitlin Ho MRN: [id number] DOB: [birth year] Today's Date: 06/30/2020    History of Present Illness Pt is a 65 y/o female admitted 06/29/20 for L5-S1 bilateral laminectomy and decompression of the L5 and S1 nerve roots.  Posterior lumbar interbody arthrodesis with peek spacers local autograft and allograft with Proteus.  Pedicle screw fixation to the sacrum and the ilium from previous L2-L5 fusion. Pt has a PMh including Cancer, Cervicalgia, Fibromyalgia, GERD, Hashimoto's disease, Homonymous hemianopia, Hyperlipidemia, Hypertension, Hypothyroid, Other insomnia, Other sequelae of cerebral infarction, Palpitations, Pneumonia, Prediabetes, Sleep apnea, Stroke ([place]), Supraventricular tachycardia.   Clinical Impression   Pt is typically independent in ADL and mobility. She has DME but does not typically use it for mobility. She does use her shower chair/bars for bathing. Today She is overall min guard. She is able to demonstrate figure 4 method to access LB for bathing/dressing and able to perform standing grooming utilizing compensatory strategies to maintain back precautions at the sink. She did require min A to don lumbar corset in sitting. She had good recall of back precautions from previous back sx. At this time feel as though education is complete, and OT will sign off - she has good support from her husband and family. Handout provided and Pt with no questions for OT at the end of session.     Follow Up Recommendations  No OT follow up;Supervision - Intermittent    Equipment Recommendations  None recommended by OT (Pt has appropriate DME)    Recommendations for Other Services       Precautions / Restrictions Precautions Precautions: Back Precaution Booklet Issued: Yes (comment) Required Braces or Orthoses: Spinal Brace Spinal Brace: Lumbar corset Restrictions Weight Bearing Restrictions: No      Mobility Bed Mobility Overal  bed mobility: Needs Assistance Bed Mobility: Rolling;Sidelying to Sit Rolling: Min guard Sidelying to sit: Min guard       General bed mobility comments: educated and good performance of log roll    Transfers Overall transfer level: Needs assistance Equipment used: None Transfers: Sit to/from Stand Sit to Stand: Min guard         General transfer comment: min guard for safety - no LOB    Balance Overall balance assessment: Mild deficits observed, not formally tested                                         ADL either performed or assessed with clinical judgement   ADL Overall ADL's : Needs assistance/impaired Eating/Feeding: Independent   Grooming: Wash/dry face;Oral care;Min guard;Standing Grooming Details (indicate cue type and reason): good use of cup method for oral care, educated on compensatory strategies Upper Body Bathing: Minimal assistance;Sitting;With adaptive equipment   Lower Body Bathing: Min guard;Sitting/lateral leans Lower Body Bathing Details (indicate cue type and reason): able to perform figure 4 Upper Body Dressing : Minimal assistance;Sitting Upper Body Dressing Details (indicate cue type and reason): to get brace started Lower Body Dressing: Min guard;Sit to/from stand   Toilet Transfer: Min guard;Ambulation   Toileting- Clothing Manipulation and Hygiene: Min guard;Sit to/from stand       Functional mobility during ADLs: Min guard       Vision Baseline Vision/History: Wears glasses Patient Visual Report: No change from baseline       Perception     Praxis      Pertinent Vitals/Pain  Pain Assessment: 0-10 Pain Score: 5  Pain Location: lumbar spine incision site Pain Descriptors / Indicators: Tender;Discomfort;Operative site guarding Pain Intervention(s): Monitored during session;Repositioned     Hand Dominance Right   Extremity/Trunk Assessment Upper Extremity Assessment Upper Extremity Assessment: Overall  WFL for tasks assessed   Lower Extremity Assessment Lower Extremity Assessment: Defer to PT evaluation   Cervical / Trunk Assessment Cervical / Trunk Assessment: Other exceptions Cervical / Trunk Exceptions: s/p lumbar sx   Communication Communication Communication: No difficulties   Cognition Arousal/Alertness: Awake/alert Behavior During Therapy: WFL for tasks assessed/performed Overall Cognitive Status: Within Functional Limits for tasks assessed                                     General Comments       Exercises     Shoulder Instructions      Home Living Family/patient expects to be discharged to:: Private residence Living Arrangements: Spouse/significant other Available Help at Discharge: Family;Available 24 hours/day Type of Home: House Home Access: Stairs to enter [employer] of Steps: 5 Entrance Stairs-Rails: Right;Left;Can reach both Home Layout: One level     Bathroom Shower/Tub: Occupational psychologist: Handicapped height Bathroom Accessibility: Yes How Accessible: Accessible via walker Home Equipment: [place] - 2 wheels;Bedside commode;Shower seat - built in;Grab bars - toilet;Grab bars - tub/shower;Hand held shower head;Adaptive equipment Adaptive Equipment: Long-handled sponge        Prior Functioning/Environment Level of Independence: Independent        Comments: has RW but typically does not use it        OT Problem List: Decreased activity tolerance;Decreased knowledge of use of DME or AE;Decreased knowledge of precautions;Obesity;Pain      OT Treatment/Interventions:      OT Goals(Current goals can be found in the care plan section) Acute Rehab OT Goals Patient Stated Goal: To get back to walking better OT Goal Formulation: With patient Time For Goal Achievement: 07/14/20 Potential to Achieve Goals: Good  OT Frequency:     Barriers to D/C:            Co-evaluation              AM-PAC  OT "6 Clicks" Daily Activity     Outcome Measure Help from another person eating meals?: None Help from another person taking care of personal grooming?: A Little Help from another person toileting, which includes using toliet, bedpan, or urinal?: A Little Help from another person bathing (including washing, rinsing, drying)?: A Little Help from another person to put on and taking off regular upper body clothing?: A Little Help from another person to put on and taking off regular lower body clothing?: A Little 6 Click Score: 19   End of Session Equipment Utilized During Treatment: Other (comment) (lumbar corset brace) Nurse Communication: Mobility status;Precautions  Activity Tolerance: Patient tolerated treatment well Patient left: in chair;with call bell/phone within reach  OT Visit Diagnosis: Muscle weakness (generalized) (M62.81);Pain Pain - Right/Left:  (central) Pain - part of body:  (lumbar spine)                Time: 9242-6834 OT Time Calculation (min): 23 min Charges:  OT General Charges $OT Visit: 1 Visit OT Evaluation $OT Eval Moderate Complexity: 1 Mod OT Treatments $Self Care/Home Management : 8-22 mins  Jesse Sans OTR/L Acute Rehabilitation Services Pager: [phone] Office: [phone]  Merri Ray  Junaid Wurzer 06/30/2020, 12:59 PM

## 2020-06-30 NOTE — Evaluation (Signed)
Physical Therapy Evaluation and Discharge Patient Details Name: Caitlin Ho MRN: [id number] DOB: [birth year] Today's Date: 06/30/2020   History of Present Illness  Pt is a 65 y/o female admitted 06/29/20 for L5-S1 bilateral laminectomy and decompression of the L5 and S1 nerve roots.  Posterior lumbar interbody arthrodesis with peek spacers local autograft and allograft with Proteus.  Pedicle screw fixation to the sacrum and the ilium from previous L2-L5 fusion. Pt has a PMh including Cancer, Cervicalgia, Fibromyalgia, GERD, Hashimoto's disease, Homonymous hemianopia, Hyperlipidemia, Hypertension, Hypothyroid, Other insomnia, Other sequelae of cerebral infarction, Palpitations, Pneumonia, Prediabetes, Sleep apnea, Stroke ([place]), Supraventricular tachycardia.    Clinical Impression  Patient evaluated by Physical Therapy with no further acute PT needs identified. All education has been completed and the patient has no further questions. Pt was able to demonstrate transfers and ambulation with gross supervision for safety to modified independence and RW for support. Pt was educated on precautions, brace application/wearing schedule, appropriate activity progression, and car transfer. See below for any follow-up Physical Therapy or equipment needs. PT is signing off. Thank you for this referral.     Follow Up Recommendations No PT follow up;Supervision for mobility/OOB    Equipment Recommendations  None recommended by PT    Recommendations for Other Services       Precautions / Restrictions Precautions Precautions: Back Precaution Booklet Issued: Yes (comment) Required Braces or Orthoses: Spinal Brace Spinal Brace: Lumbar corset Restrictions Weight Bearing Restrictions: No      Mobility  Bed Mobility Overal bed mobility: Modified Independent Bed Mobility: Rolling;Sit to Sidelying Rolling: Modified independent (Device/Increase time) Sidelying to sit: Min guard     Sit to sidelying:  Modified independent (Device/Increase time) General bed mobility comments: educated and good performance of log roll    Transfers Overall transfer level: Needs assistance Equipment used: None Transfers: Sit to/from Stand Sit to Stand: Modified independent (Device/Increase time)         General transfer comment: Pt demonstrated proper hand placement on seated surface for safety. No assist required.  Ambulation/Gait Ambulation/Gait assistance: Supervision Gait Distance (Feet): 300 Feet Assistive device: None;Rolling walker (2 wheeled) Gait Pattern/deviations: Step-through pattern;Decreased stride length;Trunk flexed Gait velocity: Decreased Gait velocity interpretation: 1.31 - 2.62 ft/sec, indicative of limited community ambulator General Gait Details: Initially without AD and pt moving slow and guarded. With RW, pt with improved gait pattern, gait speed, and overall appearance of comfort.  Stairs Stairs: Yes Stairs assistance: Min guard Stair Management: One rail Right;Step to pattern;Forwards Number of Stairs: 5 General stair comments: VC's for sequencing and general safety. Pt negotiated stairs well without difficulty.  Wheelchair Mobility    Modified Rankin (Stroke Patients Only)       Balance Overall balance assessment: Mild deficits observed, not formally tested                                           Pertinent Vitals/Pain Pain Assessment: Faces Pain Score: 5  Faces Pain Scale: Hurts little more Pain Location: lumbar spine incision site Pain Descriptors / Indicators: Tender;Discomfort;Operative site guarding Pain Intervention(s): Limited activity within patient's tolerance;Monitored during session;Repositioned    Home Living Family/patient expects to be discharged to:: Private residence Living Arrangements: Spouse/significant other Available Help at Discharge: Family;Available 24 hours/day Type of Home: House Home Access: Stairs to  enter Entrance Stairs-Rails: Right;Left;Can reach both Entrance Stairs-Number of Steps: 5 Home Layout:  One level Home Equipment: [place] - 2 wheels;Bedside commode;Shower seat - built in;Grab bars - toilet;Grab bars - tub/shower;Hand held shower head;Adaptive equipment      Prior Function Level of Independence: Independent         Comments: has RW but typically does not use it     Hand Dominance   Dominant Hand: Right    Extremity/Trunk Assessment   Upper Extremity Assessment Upper Extremity Assessment: Defer to OT evaluation    Lower Extremity Assessment Lower Extremity Assessment: Generalized weakness (Consistent with pre-op diagnosis)    Cervical / Trunk Assessment Cervical / Trunk Assessment: Other exceptions Cervical / Trunk Exceptions: s/p lumbar sx  Communication   Communication: No difficulties  Cognition Arousal/Alertness: Awake/alert Behavior During Therapy: WFL for tasks assessed/performed Overall Cognitive Status: Within Functional Limits for tasks assessed                                        General Comments      Exercises     Assessment/Plan    PT Assessment Patent does not need any further PT services  PT Problem List         PT Treatment Interventions      PT Goals (Current goals can be found in the Care Plan section)  Acute Rehab PT Goals Patient Stated Goal: To get back to walking better PT Goal Formulation: All assessment and education complete, DC therapy    Frequency     Barriers to discharge        Co-evaluation               AM-PAC PT "6 Clicks" Mobility  Outcome Measure Help needed turning from your back to your side while in a flat bed without using bedrails?: None Help needed moving from lying on your back to sitting on the side of a flat bed without using bedrails?: None Help needed moving to and from a bed to a chair (including a wheelchair)?: None Help needed standing up from a chair using  your arms (e.g., wheelchair or bedside chair)?: None Help needed to walk in hospital room?: A Little Help needed climbing 3-5 steps with a railing? : A Little 6 Click Score: 22    End of Session Equipment Utilized During Treatment: Gait belt;Back brace Activity Tolerance: Patient tolerated treatment well Patient left: in bed;with call bell/phone within reach Nurse Communication: Mobility status PT Visit Diagnosis: Unsteadiness on feet (R26.81);Pain Pain - part of body:  (back)    Time: 5573-2202 PT Time Calculation (min) (ACUTE ONLY): 17 min   Charges:   PT Evaluation $PT Eval Low Complexity: 1 Low          Rolinda Roan, PT, DPT Acute Rehabilitation Services Pager: [phone] Office: [phone]   Thelma Comp 06/30/2020, 1:18 PM

## 2020-07-01 LAB — GLUCOSE, CAPILLARY: Glucose-Capillary: 93 mg/dL (ref 70–99)

## 2020-07-01 MED ORDER — ASPIRIN EC 81 MG PO TBEC: 81.0000 mg | DELAYED_RELEASE_TABLET | Freq: Every day | ORAL | 11 refills | Status: AC

## 2020-07-01 MED ORDER — HYDROCODONE-ACETAMINOPHEN 5-325 MG PO TABS: 1.0000 | ORAL_TABLET | ORAL | 0 refills | Status: DC | PRN

## 2020-07-01 NOTE — Discharge Summary (Signed)
Physician Discharge Summary  Patient ID: Caitlin Ho MRN: [id number] DOB/AGE: [birth year] 63 y.o.  Admit date: 06/29/2020 Discharge date: 07/01/2020  Admission Diagnoses:  Lumbar spinal stenosis  Discharge Diagnoses:  Same Active Problems:   Spinal stenosis, lumbar region, with neurogenic claudication   Discharged Condition: Stable  Hospital Course:  Caitlin Ho is a 65 y.o. female who was admitted for the below procedure. There were no post operative complications. At time of discharge, pain was well controlled, ambulating with Pt/OT, tolerating po, voiding normal. Ready for discharge.  Treatments: Surgery L5-S1 bilateral laminectomy and decompression of the L5 and S1 nerve roots.  Posterior lumbar interbody arthrodesis with peek spacers local autograft and allograft with Proteus.  Pedicle screw fixation to the sacrum and the ilium from previous L2-L5 fusion.  Discharge Exam: Blood pressure (!) 99/55, pulse 95, temperature 98.3 F (36.8 C), temperature source Oral, resp. rate 18, height 5\' 5"  (1.651 m), weight 97.5 kg, SpO2 96 %. Awake, alert, oriented Speech fluent, appropriate CN grossly intact 5/5 BUE/BLE Wound c/d/i  Disposition: Discharge disposition: 01-Home or Self Care       Discharge Instructions    Call MD for:  difficulty breathing, headache or visual disturbances   Complete by: As directed    Call MD for:  persistant dizziness or light-headedness   Complete by: As directed    Call MD for:  redness, tenderness, or signs of infection (pain, swelling, redness, odor or green/yellow discharge around incision site)   Complete by: As directed    Call MD for:  severe uncontrolled pain   Complete by: As directed    Call MD for:  temperature >100.4   Complete by: As directed    Diet - low sodium heart healthy   Complete by: As directed    Driving Restrictions   Complete by: As directed    Do not drive until given clearance.   Incentive spirometry RT    Complete by: As directed    Increase activity slowly   Complete by: As directed    Lifting restrictions   Complete by: As directed    Do not lift anything >10lbs. Avoid bending and twisting in awkward positions. Avoid bending at the back.   May shower / Bathe   Complete by: As directed    In 24 hours. Okay to wash wound with warm soapy water. Avoid scrubbing the wound. Pat dry.   Remove dressing in 48 hours   Complete by: As directed      Allergies as of 07/01/2020      Reactions   Lipitor [atorvastatin]    Myalgia   Pravastatin    Joint pain      Medication List    STOP taking these medications   traMADol 50 MG tablet Commonly known as: ULTRAM     TAKE these medications   aspirin EC 81 MG tablet Take 1 tablet (81 mg total) by mouth daily. Start taking on: Jul 06, 2020 What changed: These instructions start on Jul 06, 2020. If you are unsure what to do until then, ask your doctor or other care provider.   cetirizine 10 MG tablet Commonly known as: ZYRTEC Take 10 mg by mouth daily as needed for allergies.   fenofibrate 160 MG tablet TAKE 1 TABLET EVERY DAY   furosemide 40 MG tablet Commonly known as: LASIX TAKE 1 TABLET EVERY DAY   gabapentin 600 MG tablet Commonly known as: NEURONTIN TAKE 1 TABLET THREE TIMES DAILY  HYDROcodone-acetaminophen 5-325 MG tablet Commonly known as: NORCO/VICODIN Take 1-2 tablets by mouth every 4 (four) hours as needed for moderate pain or severe pain. What changed:   how much to take  when to take this  reasons to take this   levothyroxine 125 MCG tablet Commonly known as: SYNTHROID TAKE 1 TABLET EVERY DAY BEFORE BREAKFAST What changed: See the new instructions.   methocarbamol 500 MG tablet Commonly known as: ROBAXIN TAKE 1 TABLET THREE TIMES DAILY   metoprolol tartrate 25 MG tablet Commonly known as: LOPRESSOR TAKE 1 TABLET TWICE DAILY   omeprazole 40 MG capsule Commonly known as: PRILOSEC TAKE 1 CAPSULE EVERY  DAY   polyethylene glycol 17 g packet Commonly known as: MIRALAX / GLYCOLAX Take 17 g by mouth daily as needed for moderate constipation.   Vitamin D (Ergocalciferol) 1.25 MG (50000 UNIT) Caps capsule Commonly known as: DRISDOL TAKE 1 CAPSULE BY MOUTH TWICE WEEKLY       Follow-up Information    Kristeen Miss, MD Follow up.   Specialty: Neurosurgery Contact information: [address] [place] [address] [phone]               Signed: Traci Sermon 07/01/2020, 7:19 AM

## 2020-07-01 NOTE — Discharge Instructions (Signed)

## 2020-07-01 NOTE — Progress Notes (Signed)
Patient is discharged from room 3C11 at this time. Alert and in stable condition. IV site d/c'd and instructions read to patient and spouse with understanding verbalized and all questions answered. Left unit via wheelchair with all belongings at side.

## 2020-07-04 ENCOUNTER — Telehealth: Payer: Self-pay

## 2020-07-04 NOTE — Progress Notes (Deleted)
Chronic Care Management Pharmacy Note  07/04/2020 Name:  Caitlin Ho MRN:  [id number] DOB:  Jul 14, 1955  Subjective: Caitlin Ho is an 65 y.o. year old female who is a primary patient of Cox, Kirsten, MD.  The CCM team was consulted for assistance with disease management and care coordination needs.    Engaged with patient by telephone for initial visit in response to provider referral for pharmacy case management and/or care coordination services.   Consent to Services:  The patient was given the following information about Chronic Care Management services today, agreed to services, and gave verbal consent: 1. CCM service includes personalized support from designated clinical staff supervised by the primary care provider, including individualized plan of care and coordination with other care providers 2. 24/7 contact phone numbers for assistance for urgent and routine care needs. 3. Service will only be billed when office clinical staff spend 20 minutes or more in a month to coordinate care. 4. Only one practitioner may furnish and bill the service in a calendar month. 5.The patient may stop CCM services at any time (effective at the end of the month) by phone call to the office staff. 6. The patient will be responsible for cost sharing (co-pay) of up to 20% of the service fee (after annual deductible is met). Patient agreed to services and consent obtained.  Patient Care Team: Rochel Brome, MD as PCP - General (Family Medicine) Burnice Logan, [facility] as Pharmacist (Pharmacist)  Recent office visits: ***  Recent consult visits: [facility] visits: {Hospital DC Yes/No:25215}  Objective:  Lab Results  Component Value Date   CREATININE 0.91 06/30/2020   BUN 9 06/30/2020   GFRNONAA >60 06/30/2020   GFRAA 71 02/22/2020   NA 140 06/30/2020   K 4.5 06/30/2020   CALCIUM 8.6 (L) 06/30/2020   CO2 29 06/30/2020   GLUCOSE 109 (H) 06/30/2020    Lab Results  Component Value Date/Time    HGBA1C 6.0 (H) 05/25/2020 09:09 AM   HGBA1C 6.0 (H) 02/22/2020 10:21 AM    Last diabetic Eye exam: No results found for: HMDIABEYEEXA  Last diabetic Foot exam: No results found for: HMDIABFOOTEX   Lab Results  Component Value Date   CHOL 164 05/25/2020   HDL 43 05/25/2020   LDLCALC 97 05/25/2020   TRIG 137 05/25/2020   CHOLHDL 3.8 05/25/2020    Hepatic Function Latest Ref Rng & Units 05/25/2020 02/22/2020 08/06/2019  Total Protein 6.0 - 8.5 g/dL 6.5 6.7 6.8  Albumin 3.8 - 4.8 g/dL 4.0 4.2 4.2  AST 0 - 40 IU/L '24 16 27  ' ALT 0 - 32 IU/L '18 14 22  ' Alk Phosphatase 44 - 121 IU/L 57 55 64  Total Bilirubin 0.0 - 1.2 mg/dL 0.3 0.3 0.2    Lab Results  Component Value Date/Time   TSH 0.654 02/22/2020 10:21 AM   TSH 1.760 08/06/2019 08:51 AM   FREET4 1.86 (H) 07/01/2014 07:35 PM    CBC Latest Ref Rng & Units 06/30/2020 06/27/2020 05/25/2020  WBC 4.0 - 10.5 K/uL 14.0(H) 10.7(H) 10.2  Hemoglobin 12.0 - 15.0 g/dL 11.1(L) 14.1 14.3  Hematocrit 36.0 - 46.0 % 35.6(L) 44.9 43.1  Platelets 150 - 400 K/uL 270 360 368    No results found for: VD25OH  Clinical ASCVD: Yes  The ASCVD Risk score Mikey Bussing DC Jr., et al., 2013) failed to calculate for the following reasons:   The patient has a prior MI or stroke diagnosis    Depression  screen [facility] 2/9 02/15/2020 05/24/2019  Decreased Interest 0 0  Down, Depressed, Hopeless 0 0  PHQ - 2 Score 0 0  Altered sleeping 0 -  Tired, decreased energy 0 -  Change in appetite 0 -  Feeling bad or failure about yourself  0 -  Trouble concentrating 0 -  Moving slowly or fidgety/restless 0 -  Suicidal thoughts 0 -  PHQ-9 Score 0 -  Difficult doing work/chores Not difficult at all -     ***Other: (CHADS2VASc if Afib, MMRC or CAT for COPD, ACT, DEXA)  Social History   Tobacco Use  Smoking Status Never Smoker  Smokeless Tobacco Never Used   BP Readings from Last 3 Encounters:  07/01/20 105/64  06/27/20 (!) 114/57  05/25/20 110/62   Pulse Readings  from Last 3 Encounters:  07/01/20 (!) 102  06/27/20 73  05/25/20 82   Wt Readings from Last 3 Encounters:  06/29/20 215 lb (97.5 kg)  06/27/20 216 lb 3 oz (98.1 kg)  05/25/20 214 lb (97.1 kg)   BMI Readings from Last 3 Encounters:  06/29/20 35.78 kg/m  06/27/20 35.98 kg/m  05/25/20 35.61 kg/m    Assessment/Interventions: Review of patient past medical history, allergies, medications, health status, including review of consultants reports, laboratory and other test data, was performed as part of comprehensive evaluation and provision of chronic care management services.   SDOH:  (Social Determinants of Health) assessments and interventions performed: Yes  SDOH Screenings   Alcohol Screen: Low Risk   . Last Alcohol Screening Score (AUDIT): 0  Depression (PHQ2-9): Low Risk   . PHQ-2 Score: 0  Financial Resource Strain: Low Risk   . Difficulty of Paying Living Expenses: Not hard at all  Food Insecurity: No Food Insecurity  . Worried About Charity fundraiser in the Last Year: Never true  . Ran Out of Food in the Last Year: Never true  Housing: Low Risk   . Last Housing Risk Score: 0  Physical Activity: Inactive  . Days of Exercise per Week: 0 days  . Minutes of Exercise per Session: 0 min  Social Connections: Socially Integrated  . Frequency of Communication with Friends and Family: More than three times a week  . Frequency of Social Gatherings with Friends and Family: Once a week  . Attends Religious Services: More than 4 times per year  . Active Member of Clubs or Organizations: Yes  . Attends Archivist Meetings: More than 4 times per year  . Marital Status: Married  Stress: No Stress Concern Present  . Feeling of Stress : Not at all  Tobacco Use: Low Risk   . Smoking Tobacco Use: Never Smoker  . Smokeless Tobacco Use: Never Used  Transportation Needs: No Transportation Needs  . Lack of Transportation (Medical): No  . Lack of Transportation (Non-Medical):  No    CCM Care Plan  Allergies  Allergen Reactions  . Lipitor [Atorvastatin]     Myalgia  . Pravastatin     Joint pain    Medications Reviewed Today    Reviewed by Georgia Duff, CRNA (Certified Registered Nurse Anesthetist) on 06/29/20 at [phone]  Med List Status: Complete  Medication Order Taking? Sig Documenting Provider Last Dose Status Informant  aspirin EC 81 MG tablet [id number] Yes Take 81 mg by mouth daily. [provider] Past Week Unknown time Active Self  cetirizine (ZYRTEC) 10 MG tablet [id number] Yes Take 10 mg by mouth daily as needed for allergies. [provider] 06/28/2020 Unknown time Active Self  fenofibrate 160 MG tablet [id number] Yes TAKE 1 TABLET EVERY DAY  Patient taking differently: Take 160 mg by mouth daily.   Lillard Anes, MD 06/28/2020 Unknown time Active   furosemide (LASIX) 40 MG tablet [id number] Yes TAKE 1 TABLET EVERY DAY  Patient taking differently: Take 40 mg by mouth daily.   Rochel Brome, MD 06/28/2020 Unknown time Active   gabapentin (NEURONTIN) 600 MG tablet [id number] Yes TAKE 1 TABLET THREE TIMES DAILY  Patient taking differently: Take 600 mg by mouth 3 (three) times daily.   Rochel Brome, MD 06/28/2020 Unknown time Active   HYDROcodone-acetaminophen (NORCO/VICODIN) 5-325 MG tablet [id number] Yes Take 1 tablet by mouth every 8 (eight) hours as needed for pain. [provider] 06/28/2020 Unknown time Active Self  levothyroxine (SYNTHROID) 125 MCG tablet [id number] Yes TAKE 1 TABLET EVERY DAY BEFORE BREAKFAST  Patient taking differently: Take 125 mcg by mouth daily before breakfast.   Rochel Brome, MD 06/29/2020 0430 Active   methocarbamol (ROBAXIN) 500 MG tablet [id number] Yes TAKE 1 TABLET THREE TIMES DAILY  Patient taking differently: Take 500 mg by mouth 3 (three) times daily.   Rochel Brome, MD 06/28/2020 Unknown time Active   metoprolol tartrate (LOPRESSOR) 25 MG tablet [id number] Yes TAKE 1 TABLET TWICE DAILY   Patient taking differently: Take 25 mg by mouth 2 (two) times daily.   Rochel Brome, MD 06/29/2020 0430 Active   omeprazole (PRILOSEC) 40 MG capsule [id number] Yes TAKE 1 CAPSULE EVERY DAY  Patient taking differently: Take 40 mg by mouth daily.   Rochel Brome, MD 06/28/2020 Unknown time Active   polyethylene glycol (MIRALAX / GLYCOLAX) 17 g packet [id number] Yes Take 17 g by mouth daily as needed for moderate constipation. [provider] Past Month Unknown time Active Self  traMADol (ULTRAM) 50 MG tablet [id number] Yes TAKE 2 TABLETS (100 MG TOTAL) BY MOUTH IN THE MORNING, AT NOON, AND AT BEDTIME. Rochel Brome, MD 06/28/2020 Unknown time Active Self  Vitamin D, Ergocalciferol, (DRISDOL) 1.25 MG (50000 UNIT) CAPS capsule [id number] No TAKE 1 CAPSULE BY MOUTH TWICE WEEKLY  Patient not taking: No sig reported   Marge Duncans, PA-C Not Taking Unknown time Consider Medication Status and Discontinue (Patient Preference) Self          Patient Active Problem List   Diagnosis Date Noted  . Spinal stenosis, lumbar region, with neurogenic claudication 06/29/2020  . Prediabetes 08/06/2019  . Chronic neck pain with history of cervical spinal surgery 06/29/2019  . 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 Stroke ([place]) 06/29/2019  . Class 2 severe obesity with body mass index (BMI) of 35 to 39.9 with serious comorbidity ([place]) 05/24/2019  . Essential hypertension, benign 05/05/2019  . Dyslipidemia associated with type 2 diabetes mellitus ([place]) 05/05/2019  . Mixed hyperlipidemia 05/05/2019  . Secondary hypothyroidism 05/05/2019  . Impaired glucose metabolism 05/05/2019  . Comorbid sleep-related hypoventilation 10/25/2018  . Lumbar radiculopathy 07/19/2013  . Lumbar stenosis 02/16/2013    Immunization History  Administered Date(s) Administered  . Influenza Inj Mdck Quad Pf 02/15/2020  . Influenza-Unspecified 01/14/2019  . PFIZER(Purple Top)SARS-COV-2 Vaccination 05/06/2019,  05/25/2019, 01/12/2020  . Pneumococcal Polysaccharide-23 01/12/2018  . Tdap 01/12/2018    Conditions to be addressed/monitored:  Hypertension, Hyperlipidemia, Diabetes and ***  There are no care plans that you recently modified to display for this patient.    Medication Assistance: {MEDASSISTANCEINFO:25044}  Patient's preferred pharmacy is:  [id number] [place], [place] -  [address] [place] [address] Phone: [phone] Fax: [phone]  [facility] - [address], [place] [address] Phone: [phone] Fax: [phone]  Uses pill box? {Yes or If no, why not?:20788} Pt endorses ***% compliance  We discussed: {Pharmacy options:24294} Patient decided to: {US Pharmacy [facility]  Care Plan and Follow Up Patient Decision:  Patient agrees to Care Plan and Follow-up.  Plan: Telephone follow up appointment with care management team member scheduled for:  ***  ***    Current Barriers:  . {pharmacybarriers:24917}  Pharmacist Clinical Goal(s):  Marland Kitchen Patient will {PHARMACYGOALCHOICES:24921} through collaboration with PharmD and provider.   Interventions: . 1:1 collaboration with Rochel Brome, MD regarding development and update of comprehensive plan of care as evidenced by provider attestation and co-signature . Inter-disciplinary care team collaboration (see longitudinal plan of care) . Comprehensive medication review performed; medication list updated in electronic medical record  Hypertension (BP goal <130/80) -{US controlled/uncontrolled:25276} -Current treatment: . ***furosemide 40 mg daily . Metoprolol tartrate 25 mg bid  -Medications previously tried: ***  -Current home readings: *** -Current dietary habits: *** -Current exercise habits: *** -{ACTIONS;DENIES/REPORTS:21021675::"Denies"} hypotensive/hypertensive symptoms -Educated on {CCM BP Counseling:25124} -Counseled  to monitor BP at home ***, document, and provide log at future appointments -{CCMPHARMDINTERVENTION:25122}  Hyperlipidemia: (LDL goal < ***) -{US controlled/uncontrolled:25276} -Current treatment: . ***aspirin ec 81 mg daily . Fenofibrate 150 mg daily   -Medications previously tried: *** pravastatin, atorvastatin,  -Current dietary patterns: *** -Current exercise habits: *** -Educated on {CCM HLD Counseling:25126} -{CCMPHARMDINTERVENTION:25122}  Diabetes (A1c goal {A1c goals:23924}) -{US controlled/uncontrolled:25276} -Current medications: . *** -Medications previously tried: ***  -Current home glucose readings . fasting glucose: *** . post prandial glucose: *** -{ACTIONS;DENIES/REPORTS:21021675::"Denies"} hypoglycemic/hyperglycemic symptoms -Current meal patterns:  . breakfast: ***  . lunch: ***  . dinner: *** . snacks: *** . drinks: *** -Current exercise: *** -Educated on {CCM DM COUNSELING:25123} -Counseled to check feet daily and get yearly eye exams -{CCMPHARMDINTERVENTION:25122}  Hypothyroidism (Goal: ***) -{US controlled/uncontrolled:25276} -Current treatment  . ***levothyroxine 125 mcg daily before breakfast -Medications previously tried: ***  -{CCMPHARMDINTERVENTION:25122}  *** (Goal: ***) -{US controlled/uncontrolled:25276} -Current treatment  . ***omeprazole 40 mg daily  . Mirlax 17 grams daily prn moderate constipation -Medications previously tried: ***  -{CCMPHARMDINTERVENTION:25122}    *** (Goal: ***) -{US controlled/uncontrolled:25276} -Current treatment  . ***hydrocodone-acetaminophen 5-325 mg every 4 hours prn moderate or severe pain . Gabapentin 600 mg tid  . Methocarbamol 500 mg tid  -Medications previously tried: ***  -{CCMPHARMDINTERVENTION:25122}  Health Maintenance -Vaccine gaps: ***shingrix -Current therapy:  . ***Vitamin D 50,000  Units twice weekly *** . Cetirizine 10 mg daily prn allergies -Educated on {ccm supplement  counseling:25128} -{CCM Patient satisfied:25129} -{CCMPHARMDINTERVENTION:25122}   Patient Goals/Self-Care Activities . Patient will:  - {pharmacypatientgoals:24919}  Follow Up Plan: {CM FOLLOW UP UUEK:80034}

## 2020-07-04 NOTE — Progress Notes (Signed)
Chronic Care Management Pharmacy Assistant   Name: Caitlin Ho  MRN: [id number] DOB: 1955/12/04  Caitlin Ho is an 65 y.o. year old female who presents for his initial CCM visit with the clinical pharmacist.  Conditions to be addressed/monitored: HTN, DM, Hypothyroid, Dyslipidemia  Recent office visits:  05/31/20-Neurosurgery, BMI, lumbar region radiculopathy  05/25/20-Dr. Cox PCP, Hypertension, Labs, follow up 53mos, Labs look good  05/19/20-Neurosurgery, Essential (primary) hypertension, Radiculopathy, lumbar region, Body mass index (BMI) 33.0-33.9, adult  02/21/21- Dr. Tobie Poet, PCP, Labs,Blood count abnormal. Wbc up a little Liver function normal, Kidney function normal, Thyroid stimulating hormone normal or therapeutic.  Lipid panel abnormal. LDL little up. Trigs dropped!, Prediabetes well controlled. HbA1C - 6.0. Continue current medications. No changes.    Recent consult visits:  none  Hospital visits:  07/03/20- Blood Transfusion 06/29/20-Admission/Dischage, lumbar fusion    Medications: Outpatient Encounter Medications as of 07/04/2020  Medication Sig  . [START ON 07/06/2020] aspirin EC 81 MG tablet Take 1 tablet (81 mg total) by mouth daily.  . cetirizine (ZYRTEC) 10 MG tablet Take 10 mg by mouth daily as needed for allergies.  . fenofibrate 160 MG tablet TAKE 1 TABLET EVERY DAY (Patient taking differently: Take 160 mg by mouth daily.)  . furosemide (LASIX) 40 MG tablet TAKE 1 TABLET EVERY DAY (Patient taking differently: Take 40 mg by mouth daily.)  . gabapentin (NEURONTIN) 600 MG tablet TAKE 1 TABLET THREE TIMES DAILY (Patient taking differently: Take 600 mg by mouth 3 (three) times daily.)  . HYDROcodone-acetaminophen (NORCO/VICODIN) 5-325 MG tablet Take 1-2 tablets by mouth every 4 (four) hours as needed for moderate pain or severe pain.  Marland Kitchen levothyroxine (SYNTHROID) 125 MCG tablet TAKE 1 TABLET EVERY DAY BEFORE BREAKFAST (Patient taking differently: Take 125 mcg by mouth  daily before breakfast.)  . methocarbamol (ROBAXIN) 500 MG tablet TAKE 1 TABLET THREE TIMES DAILY (Patient taking differently: Take 500 mg by mouth 3 (three) times daily.)  . metoprolol tartrate (LOPRESSOR) 25 MG tablet TAKE 1 TABLET TWICE DAILY (Patient taking differently: Take 25 mg by mouth 2 (two) times daily.)  . omeprazole (PRILOSEC) 40 MG capsule TAKE 1 CAPSULE EVERY DAY (Patient taking differently: Take 40 mg by mouth daily.)  . polyethylene glycol (MIRALAX / GLYCOLAX) 17 g packet Take 17 g by mouth daily as needed for moderate constipation.  . Vitamin D, Ergocalciferol, (DRISDOL) 1.25 MG (50000 UNIT) CAPS capsule TAKE 1 CAPSULE BY MOUTH TWICE WEEKLY (Patient not taking: No sig reported)   No facility-administered encounter medications on file as of 07/04/2020.     Lab Results  Component Value Date/Time   HGBA1C 6.0 (H) 05/25/2020 09:09 AM   HGBA1C 6.0 (H) 02/22/2020 10:21 AM     BP Readings from Last 3 Encounters:  07/01/20 105/64  06/27/20 (!) 114/57  05/25/20 110/62      . Have you seen any other providers since your last visit with PCP? Yes, patient has seen Neurosurgeon, she had recent lumbar fusion.   . Any changes in your medications or health? Yes, patient had lumbar surgery 06/29/20, had blood transfusion 07/03/20, she stated she is doing fair, not going as fast as she would like, the is her 7th back surgery since 2014.  Marland Kitchen Any side effects from any medications? No, patient has no reported side effects at this time.   . Do you have an symptoms or problems not managed by your medications? Yes, Patient has indoor/outdoor cats and she stated that once a  year they bring in poision ivy/oak in on their fur, she has it on her face, neck, arms and is now going to her back.  She has contacted the office to get medicine to help if from spreading.   . Any concerns about your health right now? Yes, healing from the fusion and getting back to normal  . Has your provider asked that  you check blood pressure, blood sugar, or follow special diet at home? No, patient does not check her blood pressures or blood sugars, she watches her diet some.   . Do you get any type of exercise on a regular basis? Yes, she walks around house for a few minutes each hour. She is very limited due to surgery.   . Can you think of a goal you would like to reach for your health? Yes, patient wants to be able to go out, not have surgery, just have normal life, she stated she no more gets over one surgery, then she is having another one.   . Do you have any problems getting your medications? Yes, [facility], and then she has to fix it, so it does not look like she is trying to get it filled twice.  o Patient's preferred pharmacy is:  [id number] [place], [place] - [address] [place] [address] Phone: [phone] Fax: [phone]  [facility] - [address], [place] [address] Phone: [phone] Fax: [phone]   . Is there anything that you would like to discuss during the appointment? Yes, getting back to normal life  Star Rating Drugs:  Medication:  Last Fill: Day Supply Metoprolol   06/23/20 [place], [place] Pharmacist Assistant [phone]

## 2020-07-10 ENCOUNTER — Telehealth: Payer: Self-pay

## 2020-07-10 NOTE — Telephone Encounter (Signed)
Pt called again. She is putting hydrocortisone on rash.   Royce Macadamia, [place] 07/10/20 2:28 PM

## 2020-07-10 NOTE — Telephone Encounter (Signed)
Patient informed. 

## 2020-07-10 NOTE — Telephone Encounter (Signed)
Have pt increase zyrtec to 10 mg one twice a day.   I do not think her back surgeon would want her to have a steroid shot or prednisone as this might impede healing. kc

## 2020-07-10 NOTE — Telephone Encounter (Signed)
Pt states she has been home from hospital for a week but has her yearly rash that needs steroid shot. She had back surgery 4/29. States it is on face, back, arms, and ear. Does not think she could get out to come to office for shot due to back. She is worried due to incision on back. Please advise.   Caitlin Ho, [place] 07/10/20 10:47 AM

## 2020-07-12 ENCOUNTER — Telehealth: Payer: Medicare HMO

## 2020-07-19 ENCOUNTER — Other Ambulatory Visit: Payer: Self-pay | Admitting: Family Medicine

## 2020-07-21 DIAGNOSIS — M5416 Radiculopathy, lumbar region: Secondary | ICD-10-CM | POA: Diagnosis not present

## 2020-07-26 ENCOUNTER — Ambulatory Visit (INDEPENDENT_AMBULATORY_CARE_PROVIDER_SITE_OTHER): Payer: Medicare HMO

## 2020-07-26 ENCOUNTER — Other Ambulatory Visit: Payer: Self-pay

## 2020-07-26 DIAGNOSIS — E785 Hyperlipidemia, unspecified: Secondary | ICD-10-CM | POA: Diagnosis not present

## 2020-07-26 DIAGNOSIS — R7303 Prediabetes: Secondary | ICD-10-CM | POA: Diagnosis not present

## 2020-07-26 DIAGNOSIS — I10 Essential (primary) hypertension: Secondary | ICD-10-CM | POA: Diagnosis not present

## 2020-07-26 DIAGNOSIS — E039 Hypothyroidism, unspecified: Secondary | ICD-10-CM

## 2020-07-26 DIAGNOSIS — M542 Cervicalgia: Secondary | ICD-10-CM | POA: Diagnosis not present

## 2020-07-26 DIAGNOSIS — K219 Gastro-esophageal reflux disease without esophagitis: Secondary | ICD-10-CM

## 2020-07-26 NOTE — Patient Instructions (Addendum)
Visit Information  Thank you for your time discussing your medications. I look forward to working with you to achieve your health care goals. Below is a summary of what we talked about during our visit.   Goals Addressed   None     There are no care plans to display for this patient.    Caitlin Ho was given information about Chronic Care Management services today including:  1. CCM service includes personalized support from designated clinical staff supervised by her physician, including individualized plan of care and coordination with other care providers 2. 24/7 contact phone numbers for assistance for urgent and routine care needs. 3. Standard insurance, coinsurance, copays and deductibles apply for chronic care management only during months in which we provide at least 20 minutes of these services. Most insurances cover these services at 100%, however patients may be responsible for any copay, coinsurance and/or deductible if applicable. This service may help you avoid the need for more expensive face-to-face services. 4. Only one practitioner may furnish and bill the service in a calendar month. 5. The patient may stop CCM services at any time (effective at the end of the month) by phone call to the office staff.  Patient agreed to services and verbal consent obtained.   The patient verbalized understanding of instructions, educational materials, and care plan provided today and declined offer to receive copy of patient instructions, educational materials, and care plan.  Telephone follow up appointment with pharmacy team member scheduled for: 07/2021  Sherre Poot, PharmD Clinical Pharmacist [facility] [phone] (office) [phone] (mobile)  [email or web address]">  DASH Eating Plan DASH stands for Dietary Approaches to Stop Hypertension. The DASH eating plan is a healthy eating plan that has been shown to:  Reduce  high blood pressure (hypertension).  Reduce your risk for type 2 diabetes, heart disease, and stroke.  Help with weight loss. What are tips for following this plan? Reading food labels  Check food labels for the amount of salt (sodium) per serving. Choose foods with less than 5 percent of the Daily Value of sodium. Generally, foods with less than 300 milligrams (mg) of sodium per serving fit into this eating plan.  To find whole grains, look for the word "whole" as the first word in the ingredient list. Shopping  Buy products labeled as "low-sodium" or "no salt added."  Buy fresh foods. Avoid canned foods and pre-made or frozen meals. Cooking  Avoid adding salt when cooking. Use salt-free seasonings or herbs instead of table salt or sea salt. Check with your health care provider or pharmacist before using salt substitutes.  Do not fry foods. Cook foods using healthy methods such as baking, boiling, grilling, roasting, and broiling instead.  Cook with heart-healthy oils, such as olive, canola, avocado, soybean, or sunflower oil. Meal planning  Eat a balanced diet that includes: ? 4 or more servings of fruits and 4 or more servings of vegetables each day. Try to fill one-half of your plate with fruits and vegetables. ? 6-8 servings of whole grains each day. ? Less than 6 oz (170 g) of lean meat, poultry, or fish each day. A 3-oz (85-g) serving of meat is about the same size as a deck of cards. One egg equals 1 oz (28 g). ? 2-3 servings of low-fat dairy each day. One serving is 1 cup (237 mL). ? 1 serving of nuts, seeds, or beans 5 times each week. ? 2-3 servings of heart-healthy fats. Healthy fats called omega-3  fatty acids are found in foods such as walnuts, flaxseeds, fortified milks, and eggs. These fats are also found in cold-water fish, such as sardines, salmon, and mackerel.  Limit how much you eat of: ? Canned or prepackaged foods. ? Food that is high in trans fat, such as  some fried foods. ? Food that is high in saturated fat, such as fatty meat. ? Desserts and other sweets, sugary drinks, and other foods with added sugar. ? Full-fat dairy products.  Do not salt foods before eating.  Do not eat more than 4 egg yolks a week.  Try to eat at least 2 vegetarian meals a week.  Eat more home-cooked food and less restaurant, buffet, and fast food.   Lifestyle  When eating at a restaurant, ask that your food be prepared with less salt or no salt, if possible.  If you drink alcohol: ? Limit how much you use to:  0-1 drink a day for women who are not pregnant.  0-2 drinks a day for men. ? Be aware of how much alcohol is in your drink. In the [place] one drink equals one 12 oz bottle of beer (355 mL), one 5 oz glass of wine (148 mL), or one 1 oz glass of hard liquor (44 mL). General information  Avoid eating more than 2,300 mg of salt a day. If you have hypertension, you may need to reduce your sodium intake to 1,500 mg a day.  Work with your health care provider to maintain a healthy body weight or to lose weight. Ask what an ideal weight is for you.  Get at least 30 minutes of exercise that causes your heart to beat faster (aerobic exercise) most days of the week. Activities may include walking, swimming, or biking.  Work with your health care provider or dietitian to adjust your eating plan to your individual calorie needs. What foods should I eat? Fruits All fresh, dried, or frozen fruit. Canned fruit in natural juice (without added sugar). Vegetables Fresh or frozen vegetables (raw, steamed, roasted, or grilled). Low-sodium or reduced-sodium tomato and vegetable juice. Low-sodium or reduced-sodium tomato sauce and tomato paste. Low-sodium or reduced-sodium canned vegetables. Grains Whole-grain or whole-wheat bread. Whole-grain or whole-wheat pasta. Caitlin Ho rice. Caitlin Ho. Bulgur. Whole-grain and low-sodium cereals. Pita bread. Low-fat, low-sodium  crackers. Whole-wheat flour tortillas. Meats and other proteins Skinless chicken or [place]. Ground chicken or [place]. Pork with fat trimmed off. Fish and seafood. Egg whites. Dried beans, peas, or lentils. Unsalted nuts, nut butters, and seeds. Unsalted canned beans. Lean cuts of beef with fat trimmed off. Low-sodium, lean precooked or cured meat, such as sausages or meat loaves. Dairy Low-fat (1%) or fat-free (skim) milk. Reduced-fat, low-fat, or fat-free cheeses. Nonfat, low-sodium ricotta or cottage cheese. Low-fat or nonfat yogurt. Low-fat, low-sodium cheese. Fats and oils Soft margarine without trans fats. Vegetable oil. Reduced-fat, low-fat, or light mayonnaise and salad dressings (reduced-sodium). Canola, safflower, olive, avocado, soybean, and sunflower oils. Avocado. Seasonings and condiments Herbs. Spices. Seasoning mixes without salt. Other foods Unsalted popcorn and pretzels. Fat-free sweets. The items listed above may not be a complete list of foods and beverages you can eat. Contact a dietitian for more information. What foods should I avoid? Fruits Canned fruit in a light or heavy syrup. Fried fruit. Fruit in cream or butter sauce. Vegetables Creamed or fried vegetables. Vegetables in a cheese sauce. Regular canned vegetables (not low-sodium or reduced-sodium). Regular canned tomato sauce and paste (not low-sodium or reduced-sodium). Regular tomato  and vegetable juice (not low-sodium or reduced-sodium). Angie Fava. Olives. Grains Baked goods made with fat, such as croissants, muffins, or some breads. Dry pasta or rice meal packs. Meats and other proteins Fatty cuts of meat. Ribs. Fried meat. Berniece Salines. Bologna, salami, and other precooked or cured meats, such as sausages or meat loaves. Fat from the back of a pig (fatback). Bratwurst. Salted nuts and seeds. Canned beans with added salt. Canned or smoked fish. Whole eggs or egg yolks. Chicken or [place] with skin. Dairy Whole or 2% milk,  cream, and half-and-half. Whole or full-fat cream cheese. Whole-fat or sweetened yogurt. Full-fat cheese. Nondairy creamers. Whipped toppings. Processed cheese and cheese spreads. Fats and oils Butter. Stick margarine. Lard. Shortening. Ghee. Bacon fat. Tropical oils, such as coconut, palm kernel, or palm oil. Seasonings and condiments Onion salt, garlic salt, seasoned salt, table salt, and sea salt. Worcestershire sauce. Tartar sauce. Barbecue sauce. Teriyaki sauce. Soy sauce, including reduced-sodium. Steak sauce. Canned and packaged gravies. Fish sauce. Oyster sauce. Cocktail sauce. Store-bought horseradish. Ketchup. Mustard. Meat flavorings and tenderizers. Bouillon cubes. Hot sauces. Pre-made or packaged marinades. Pre-made or packaged taco seasonings. Relishes. Regular salad dressings. Other foods Salted popcorn and pretzels. The items listed above may not be a complete list of foods and beverages you should avoid. Contact a dietitian for more information. Where to find more information  National Heart, Lung, and Blood Institute: https://wilson-eaton.com/  American Heart Association: www.heart.org  Academy of Nutrition and Dietetics: www.eatright.Magazine: www.kidney.org Summary  The DASH eating plan is a healthy eating plan that has been shown to reduce high blood pressure (hypertension). It may also reduce your risk for type 2 diabetes, heart disease, and stroke.  When on the DASH eating plan, aim to eat more fresh fruits and vegetables, whole grains, lean proteins, low-fat dairy, and heart-healthy fats.  With the DASH eating plan, you should limit salt (sodium) intake to 2,300 mg a day. If you have hypertension, you may need to reduce your sodium intake to 1,500 mg a day.  Work with your health care provider or dietitian to adjust your eating plan to your individual calorie needs. This information is not intended to replace advice given to you by your health care  provider. Make sure you discuss any questions you have with your health care provider. Document Revised: 01/22/2019 Document Reviewed: 01/22/2019 Elsevier Patient Education  2021 [employer].

## 2020-07-26 NOTE — Progress Notes (Addendum)
Chronic Care Management Pharmacy Note  07/26/2020 Name:  Caitlin Ho MRN:  [id number] DOB:  01-Jan-1956   Plan Update:   Patient would like to get future refills of tramadol with [facility] mail order if possible. It was free with [facility] and has a cost [facility].   Patient is recovering well from back surgery and hopes to get back in the gym once released from surgeon.   Recommend checking vitamin D with next labs. Patient completed course of Vitamin D 50,000 units and states she feels great. She is not currently supplementing.   Patient's cholesterol is not ideally controlled but intolerant to 2 statins in the past. Recommend considering a trial of Crestor three times weekly if LDL is above goal with next lab results. Patient does not have a diagnosis of myalgia in her chart - please consider adding due to muscle pain/arm weakness with atorvastatin and pravastatin.   Subjective: Caitlin Ho is an 65 y.o. year old female who is a primary patient of Cox, Kirsten, MD.  The CCM team was consulted for assistance with disease management and care coordination needs.    Engaged with patient by telephone for initial visit in response to provider referral for pharmacy case management and/or care coordination services.   Consent to Services:  The patient was given the following information about Chronic Care Management services today, agreed to services, and gave verbal consent: 1. CCM service includes personalized support from designated clinical staff supervised by the primary care provider, including individualized plan of care and coordination with other care providers 2. 24/7 contact phone numbers for assistance for urgent and routine care needs. 3. Service will only be billed when office clinical staff spend 20 minutes or more in a month to coordinate care. 4. Only one practitioner may furnish and bill the service in a calendar month. 5.The patient may stop CCM services at any time (effective at the  end of the month) by phone call to the office staff. 6. The patient will be responsible for cost sharing (co-pay) of up to 20% of the service fee (after annual deductible is met). Patient agreed to services and consent obtained.  Patient Care Team: Rochel Brome, MD as PCP - General (Family Medicine) Burnice Logan, [facility] as Pharmacist (Pharmacist)  Recent office visits:  05/31/20-Neurosurgery, BMI, lumbar region radiculopathy  05/25/20-Dr. Cox PCP, Hypertension, Labs, follow up 8mo, Labs look good  05/19/20-Neurosurgery, Essential (primary) hypertension, Radiculopathy, lumbar region, Body mass index (BMI) 33.0-33.9, adult  02/22/20- Dr. CTobie Poet PCP, Labs,Blood count abnormal. Wbc up a little Liver function normal, Kidney function normal, Thyroid stimulating hormone normal or therapeutic.  Lipid panel abnormal. LDL little up. Trigs dropped!, Prediabetes well controlled. HbA1C - 6.0. Continue current medications. No changes.    Recent consult visits:  none  Hospital visits:  07/03/20- Blood Transfusion 06/29/20-Admission/Dischage, lumbar fusion   Objective:  Lab Results  Component Value Date   CREATININE 0.91 06/30/2020   BUN 9 06/30/2020   GFRNONAA >60 06/30/2020   GFRAA 71 02/22/2020   NA 140 06/30/2020   K 4.5 06/30/2020   CALCIUM 8.6 (L) 06/30/2020   CO2 29 06/30/2020   GLUCOSE 109 (H) 06/30/2020    Lab Results  Component Value Date/Time   HGBA1C 6.0 (H) 05/25/2020 09:09 AM   HGBA1C 6.0 (H) 02/22/2020 10:21 AM    Last diabetic Eye exam: No results found for: HMDIABEYEEXA  Last diabetic Foot exam: No results found for: HMDIABFOOTEX   Lab Results  Component Value Date   CHOL 164 05/25/2020   HDL 43 05/25/2020   LDLCALC 97 05/25/2020   TRIG 137 05/25/2020   CHOLHDL 3.8 05/25/2020    Hepatic Function Latest Ref Rng & Units 05/25/2020 02/22/2020 08/06/2019  Total Protein 6.0 - 8.5 g/dL 6.5 6.7 6.8  Albumin 3.8 - 4.8 g/dL 4.0 4.2 4.2  AST 0 - 40 IU/L '24 16 27  ' ALT 0 -  32 IU/L '18 14 22  ' Alk Phosphatase 44 - 121 IU/L 57 55 64  Total Bilirubin 0.0 - 1.2 mg/dL 0.3 0.3 0.2    Lab Results  Component Value Date/Time   TSH 0.654 02/22/2020 10:21 AM   TSH 1.760 08/06/2019 08:51 AM   FREET4 1.86 (H) 07/01/2014 07:35 PM    CBC Latest Ref Rng & Units 06/30/2020 06/27/2020 05/25/2020  WBC 4.0 - 10.5 K/uL 14.0(H) 10.7(H) 10.2  Hemoglobin 12.0 - 15.0 g/dL 11.1(L) 14.1 14.3  Hematocrit 36.0 - 46.0 % 35.6(L) 44.9 43.1  Platelets 150 - 400 K/uL 270 360 368    No results found for: VD25OH  Clinical ASCVD: Yes  The ASCVD Risk score Mikey Bussing DC Jr., et al., 2013) failed to calculate for the following reasons:   The patient has a prior MI or stroke diagnosis    Depression screen [facility] 2/9 02/15/2020 05/24/2019  Decreased Interest 0 0  Down, Depressed, Hopeless 0 0  PHQ - 2 Score 0 0  Altered sleeping 0 -  Tired, decreased energy 0 -  Change in appetite 0 -  Feeling bad or failure about yourself  0 -  Trouble concentrating 0 -  Moving slowly or fidgety/restless 0 -  Suicidal thoughts 0 -  PHQ-9 Score 0 -  Difficult doing work/chores Not difficult at all -     Other: (CHADS2VASc if Afib, MMRC or CAT for COPD, ACT, DEXA)  Social History   Tobacco Use  Smoking Status Never Smoker  Smokeless Tobacco Never Used   BP Readings from Last 3 Encounters:  07/01/20 105/64  06/27/20 (!) 114/57  05/25/20 110/62   Pulse Readings from Last 3 Encounters:  07/01/20 (!) 102  06/27/20 73  05/25/20 82   Wt Readings from Last 3 Encounters:  06/29/20 215 lb (97.5 kg)  06/27/20 216 lb 3 oz (98.1 kg)  05/25/20 214 lb (97.1 kg)   BMI Readings from Last 3 Encounters:  06/29/20 35.78 kg/m  06/27/20 35.98 kg/m  05/25/20 35.61 kg/m    Assessment/Interventions: Review of patient past medical history, allergies, medications, health status, including review of consultants reports, laboratory and other test data, was performed as part of comprehensive evaluation and  provision of chronic care management services.   SDOH:  (Social Determinants of Health) assessments and interventions performed: Yes  SDOH Screenings   Alcohol Screen: Low Risk   . Last Alcohol Screening Score (AUDIT): 0  Depression (PHQ2-9): Low Risk   . PHQ-2 Score: 0  Financial Resource Strain: Low Risk   . Difficulty of Paying Living Expenses: Not hard at all  Food Insecurity: No Food Insecurity  . Worried About Charity fundraiser in the Last Year: Never true  . Ran Out of Food in the Last Year: Never true  Housing: Low Risk   . Last Housing Risk Score: 0  Physical Activity: Inactive  . Days of Exercise per Week: 0 days  . Minutes of Exercise per Session: 0 min  Social Connections: Socially Integrated  . Frequency of Communication with Friends and Family: More  than three times a week  . Frequency of Social Gatherings with Friends and Family: Once a week  . Attends Religious Services: More than 4 times per year  . Active Member of Clubs or Organizations: Yes  . Attends Archivist Meetings: More than 4 times per year  . Marital Status: Married  Stress: No Stress Concern Present  . Feeling of Stress : Not at all  Tobacco Use: Low Risk   . Smoking Tobacco Use: Never Smoker  . Smokeless Tobacco Use: Never Used  Transportation Needs: No Transportation Needs  . Lack of Transportation (Medical): No  . Lack of Transportation (Non-Medical): No    CCM Care Plan  Allergies  Allergen Reactions  . Lipitor [Atorvastatin]     Myalgia  . Pravastatin     Joint pain    Medications Reviewed Today    Reviewed by Burnice Logan, [facility] (Pharmacist) on 07/26/20 at 15  Med List Status: <None>  Medication Order Taking? Sig Documenting Provider Last Dose Status Informant  aspirin EC 81 MG tablet [id number] Yes Take 1 tablet (81 mg total) by mouth daily. Costella, Vista Mink, PA-C Taking Active   cetirizine (ZYRTEC) 10 MG tablet [id number] Yes Take 10 mg by mouth daily as needed  for allergies. [provider] Taking Active Self  fenofibrate 160 MG tablet [id number] Yes TAKE 1 TABLET EVERY DAY  Patient taking differently: Take 160 mg by mouth daily.   Lillard Anes, MD Taking Active   furosemide (LASIX) 40 MG tablet [id number] Yes TAKE 1 TABLET EVERY DAY  Patient taking differently: Take 40 mg by mouth daily.   Cox, Kirsten, MD Taking Active   gabapentin (NEURONTIN) 600 MG tablet [id number] Yes TAKE 1 TABLET THREE TIMES DAILY  Patient taking differently: Take 600 mg by mouth 3 (three) times daily.   Cox, Kirsten, MD Taking Active   HYDROcodone-acetaminophen (NORCO/VICODIN) 5-325 MG tablet [id number] No Take 1-2 tablets by mouth every 4 (four) hours as needed for moderate pain or severe pain.  Patient not taking: Reported on 07/26/2020   Traci Sermon, PA-C Not Taking Active   levothyroxine (SYNTHROID) 125 MCG tablet [id number] Yes TAKE 1 TABLET EVERY DAY BEFORE BREAKFAST  Patient taking differently: Take 125 mcg by mouth daily before breakfast.   Cox, Kirsten, MD Taking Active   methocarbamol (ROBAXIN) 500 MG tablet [id number] Yes TAKE 1 TABLET THREE TIMES DAILY  Patient taking differently: Take 500 mg by mouth in the morning, at noon, and at bedtime.   Cox, Kirsten, MD Taking Active   metoprolol tartrate (LOPRESSOR) 25 MG tablet [id number] Yes TAKE 1 TABLET TWICE DAILY  Patient taking differently: Take 25 mg by mouth 2 (two) times daily.   Cox, Kirsten, MD Taking Active   omeprazole (PRILOSEC) 40 MG capsule [id number] Yes TAKE 1 CAPSULE EVERY DAY  Patient taking differently: Take 40 mg by mouth daily.   Cox, Kirsten, MD Taking Active   polyethylene glycol (MIRALAX / GLYCOLAX) 17 g packet [id number] Yes Take 17 g by mouth daily as needed for moderate constipation. [provider] Taking Active Self  traMADol (ULTRAM) 50 MG tablet [id number] Yes TAKE 2 TABLETS (100 MG TOTAL) BY MOUTH IN THE MORNING, AT NOON, AND AT BEDTIME. Cox, Kirsten, MD  Taking Active   Vitamin D, Ergocalciferol, (DRISDOL) 1.25 MG (50000 UNIT) CAPS capsule [id number] No TAKE 1 CAPSULE BY MOUTH TWICE WEEKLY  Patient not taking: No sig reported   Marge Duncans, PA-C Not Taking Active Self  Patient Active Problem List   Diagnosis Date Noted  . Spinal stenosis, lumbar region, with neurogenic claudication 06/29/2020  . Prediabetes 08/06/2019  . Chronic neck pain with history of cervical spinal surgery 06/29/2019  . 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 Stroke ([place]) 06/29/2019  . Class 2 severe obesity with body mass index (BMI) of 35 to 39.9 with serious comorbidity ([place]) 05/24/2019  . Essential hypertension, benign 05/05/2019  . Dyslipidemia associated with type 2 diabetes mellitus ([place]) 05/05/2019  . Mixed hyperlipidemia 05/05/2019  . Secondary hypothyroidism 05/05/2019  . Impaired glucose metabolism 05/05/2019  . Comorbid sleep-related hypoventilation 10/25/2018  . Lumbar radiculopathy 07/19/2013  . Lumbar stenosis 02/16/2013    Immunization History  Administered Date(s) Administered  . Influenza Inj Mdck Quad Pf 02/15/2020  . Influenza-Unspecified 01/14/2019  . PFIZER(Purple Top)SARS-COV-2 Vaccination 05/06/2019, 05/25/2019, 01/12/2020  . Pneumococcal Polysaccharide-23 01/12/2018  . Tdap 01/12/2018    Conditions to be addressed/monitored:  Hypertension, Hyperlipidemia, Diabetes and GERD  Care Plan : [place]  Updates made by Burnice Logan, [place] since 07/26/2020 12:00 AM    Problem: htn, hld, prediabetes, gerd   Priority: High  Onset Date: 07/26/2020    Long-Range Goal: Disease State Management   Start Date: 07/26/2020  Expected End Date: 07/26/2021  This Visit's Progress: On track  Priority: High  Note:   Current Barriers:  . Suboptimal therapeutic regimen for cholesterol  Pharmacist Clinical Goal(s):  Marland Kitchen Patient will achieve control of cholesterol as evidenced by lipid panel.  through  collaboration with PharmD and provider.   Interventions: . 1:1 collaboration with Rochel Brome, MD regarding development and update of comprehensive plan of care as evidenced by provider attestation and co-signature . Inter-disciplinary care team collaboration (see longitudinal plan of care) . Comprehensive medication review performed; medication list updated in electronic medical record  Hypertension (BP goal <130/80) -Controlled -Current treatment: . furosemide 40 mg daily . Metoprolol tartrate 25 mg bid  -Medications previously tried: none reported  -Current home readings: patient does not check - well controlled per patient  -Current dietary habits: patient uses very little salt on food -Current exercise habits: limited due to back surgery recovering. Had returned to the gym prior to recent surgery.  -Denies hypotensive/hypertensive symptoms -Educated on BP goals and benefits of medications for prevention of heart attack, stroke and kidney damage; Daily salt intake goal < 2300 mg; Exercise goal of 150 minutes per week; -Counseled to monitor BP at home as needed, document, and provide log at future appointments -Counseled on diet and exercise extensively Recommended to continue current medication  Hyperlipidemia: (LDL goal < 55) -Not ideally controlled -Current treatment: . Aspirin ec 81 mg daily . Fenofibrate 150 mg daily   -Medications previously tried:  pravastatin, atorvastatin - made muscles ache  -Current dietary patterns: likes chicken and vegetables -Current exercise habits: limited due to back surgery recovering. Had returned to the gym prior to recent surgery.  -Educated on Cholesterol goals;  Importance of limiting foods high in cholesterol; Exercise goal of 150 minutes per week; -Counseled on diet and exercise extensively Recommended to continue current medication  Prediabetes (A1c goal <6.5%) -Controlled -Current medications: . Diet and lifestyle   -Medications previously tried: none reported  -Current home glucose readings . fasting glucose: not checking  . post prandial glucose: not checking  -Denies hypoglycemic/hyperglycemic symptoms -Current meal patterns:  . breakfast: not a big breakfast eater. Coffee and granola bar or muffin. . Lunch and dinner: enjoys  chicken, broccoli, rice, mashed potatoes, green beans, peas and carrots. Beef stew.  . snacks: chips, apples with peanut butter, bananas . drinks: coffee, diet mountain dew - 1 per day, flavored waters -Current exercise: limited due to back surgery recovering. Had returned to the gym prior to recent surgery.  -Educated on A1c and blood sugar goals; Exercise goal of 150 minutes per week; Benefits of weight loss; Carbohydrate counting and/or plate method -Counseled to check feet daily and get yearly eye exams -Counseled on diet and exercise extensively  Hypothyroidism (Goal: manage TSH) -Controlled -Current treatment  . levothyroxine 125 mcg daily before breakfast -Medications previously tried: none reported   -Recommended to continue current medication  GERD (Goal: manage symptoms of GERD) -Controlled -Current treatment  . omeprazole 40 mg daily  . Mirlax 17 grams daily prn moderate constipation -Medications previously tried: none reported  -Counseled on diet and exercise extensively Counseled on options of reducing omeprazole dose or discontinuing in the future if symptoms are managed by diet and lifestyle. Patietn declines at this time. Marland Kitchen Spicy foods are a trigger for her. Symptoms improved if she eats before 6 pm.     Chronic Neck Pain Goal: manage pain) -Controlled -Current treatment  . Gabapentin 600 mg tid  . Methocarbamol 500 mg tid  . Tramadol 50 mg 2 tablets by mouth tid.  -Medications previously tried:  hydrocodone -Counseled on diet and exercise extensively Recommended to continue current medication. Patient hopes to reduce dose back to 300 mg tid  at next visit with Dr. Tobie Poet.   Health Maintenance -Vaccine gaps: Recommend shingrix -Current therapy:  . Cetirizine 10 mg daily prn allergies -Recommended to continue current medication   Patient Goals/Self-Care Activities . Patient will:  - take medications as prescribed focus on medication adherence by taking medication as prescribed target a minimum of 150 minutes of moderate intensity exercise weekly  Follow Up Plan: Telephone follow up appointment with care management team member scheduled for: 07/2021      Medication Assistance: None required.  Patient affirms current coverage meets needs.  Patient's [facility] pharmacy [facility]:  [id number] [place] [place] - [address] [place] [place]: [phone]: [phone] [facility] [address] [place]Phone: [phone]: [phone] Uses pill box? No - stores them together. Has used pill box in the past but doesn't see the benefit.  Pt endorses good compliance  We discussed: Current pharmacy is preferred with insurance plan and patient is satisfied with pharmacy services Patient decided to: Continue current medication management strategy  Care Plan and Follow Up Patient Decision:  Patient agrees to Care Plan and Follow-up.  Plan: Telephone follow up appointment with care management team member scheduled for:  07/2021

## 2020-07-27 ENCOUNTER — Other Ambulatory Visit: Payer: Self-pay

## 2020-07-27 MED ORDER — TRAMADOL HCL 50 MG PO TABS: 100.0000 mg | ORAL_TABLET | Freq: Three times a day (TID) | ORAL | 0 refills | Status: DC

## 2020-07-28 ENCOUNTER — Other Ambulatory Visit: Payer: Self-pay | Admitting: Physician Assistant

## 2020-08-01 ENCOUNTER — Other Ambulatory Visit: Payer: Self-pay | Admitting: Family Medicine

## 2020-08-09 ENCOUNTER — Other Ambulatory Visit: Payer: Self-pay | Admitting: Physician Assistant

## 2020-08-13 ENCOUNTER — Other Ambulatory Visit: Payer: Self-pay | Admitting: Legal Medicine

## 2020-08-18 ENCOUNTER — Other Ambulatory Visit: Payer: Self-pay | Admitting: Family Medicine

## 2020-08-21 ENCOUNTER — Other Ambulatory Visit: Payer: Self-pay | Admitting: Family Medicine

## 2020-08-28 DIAGNOSIS — M79604 Pain in right leg: Secondary | ICD-10-CM | POA: Diagnosis not present

## 2020-08-28 DIAGNOSIS — M545 Low back pain, unspecified: Secondary | ICD-10-CM | POA: Diagnosis not present

## 2020-08-28 DIAGNOSIS — M79651 Pain in right thigh: Secondary | ICD-10-CM | POA: Diagnosis not present

## 2020-08-28 DIAGNOSIS — R2689 Other abnormalities of gait and mobility: Secondary | ICD-10-CM | POA: Diagnosis not present

## 2020-08-30 ENCOUNTER — Ambulatory Visit: Payer: Medicare HMO | Admitting: Family Medicine

## 2020-08-30 DIAGNOSIS — M5416 Radiculopathy, lumbar region: Secondary | ICD-10-CM | POA: Diagnosis not present

## 2020-08-31 DIAGNOSIS — R2689 Other abnormalities of gait and mobility: Secondary | ICD-10-CM | POA: Diagnosis not present

## 2020-08-31 DIAGNOSIS — M79651 Pain in right thigh: Secondary | ICD-10-CM | POA: Diagnosis not present

## 2020-08-31 DIAGNOSIS — M79604 Pain in right leg: Secondary | ICD-10-CM | POA: Diagnosis not present

## 2020-08-31 DIAGNOSIS — M545 Low back pain, unspecified: Secondary | ICD-10-CM | POA: Diagnosis not present

## 2020-09-04 ENCOUNTER — Other Ambulatory Visit: Payer: Self-pay | Admitting: Physician Assistant

## 2020-09-07 ENCOUNTER — Other Ambulatory Visit: Payer: Self-pay | Admitting: Family Medicine

## 2020-09-07 DIAGNOSIS — M545 Low back pain, unspecified: Secondary | ICD-10-CM | POA: Diagnosis not present

## 2020-09-07 DIAGNOSIS — M79651 Pain in right thigh: Secondary | ICD-10-CM | POA: Diagnosis not present

## 2020-09-07 DIAGNOSIS — R2689 Other abnormalities of gait and mobility: Secondary | ICD-10-CM | POA: Diagnosis not present

## 2020-09-07 DIAGNOSIS — M79604 Pain in right leg: Secondary | ICD-10-CM | POA: Diagnosis not present

## 2020-09-13 DIAGNOSIS — M79651 Pain in right thigh: Secondary | ICD-10-CM | POA: Diagnosis not present

## 2020-09-13 DIAGNOSIS — M79604 Pain in right leg: Secondary | ICD-10-CM | POA: Diagnosis not present

## 2020-09-13 DIAGNOSIS — M545 Low back pain, unspecified: Secondary | ICD-10-CM | POA: Diagnosis not present

## 2020-09-13 DIAGNOSIS — R2689 Other abnormalities of gait and mobility: Secondary | ICD-10-CM | POA: Diagnosis not present

## 2020-09-21 DIAGNOSIS — R2689 Other abnormalities of gait and mobility: Secondary | ICD-10-CM | POA: Diagnosis not present

## 2020-09-21 DIAGNOSIS — M79604 Pain in right leg: Secondary | ICD-10-CM | POA: Diagnosis not present

## 2020-09-21 DIAGNOSIS — M545 Low back pain, unspecified: Secondary | ICD-10-CM | POA: Diagnosis not present

## 2020-09-21 DIAGNOSIS — M79651 Pain in right thigh: Secondary | ICD-10-CM | POA: Diagnosis not present

## 2020-09-27 DIAGNOSIS — R2689 Other abnormalities of gait and mobility: Secondary | ICD-10-CM | POA: Diagnosis not present

## 2020-09-27 DIAGNOSIS — M79604 Pain in right leg: Secondary | ICD-10-CM | POA: Diagnosis not present

## 2020-09-27 DIAGNOSIS — M545 Low back pain, unspecified: Secondary | ICD-10-CM | POA: Diagnosis not present

## 2020-09-27 DIAGNOSIS — M79651 Pain in right thigh: Secondary | ICD-10-CM | POA: Diagnosis not present

## 2020-10-04 DIAGNOSIS — M79651 Pain in right thigh: Secondary | ICD-10-CM | POA: Diagnosis not present

## 2020-10-04 DIAGNOSIS — M545 Low back pain, unspecified: Secondary | ICD-10-CM | POA: Diagnosis not present

## 2020-10-04 DIAGNOSIS — M79604 Pain in right leg: Secondary | ICD-10-CM | POA: Diagnosis not present

## 2020-10-04 DIAGNOSIS — R2689 Other abnormalities of gait and mobility: Secondary | ICD-10-CM | POA: Diagnosis not present

## 2020-10-11 DIAGNOSIS — M545 Low back pain, unspecified: Secondary | ICD-10-CM | POA: Diagnosis not present

## 2020-10-11 DIAGNOSIS — R2689 Other abnormalities of gait and mobility: Secondary | ICD-10-CM | POA: Diagnosis not present

## 2020-10-11 DIAGNOSIS — M79651 Pain in right thigh: Secondary | ICD-10-CM | POA: Diagnosis not present

## 2020-10-11 DIAGNOSIS — M79604 Pain in right leg: Secondary | ICD-10-CM | POA: Diagnosis not present

## 2020-10-16 ENCOUNTER — Ambulatory Visit (INDEPENDENT_AMBULATORY_CARE_PROVIDER_SITE_OTHER): Payer: Medicare HMO

## 2020-10-16 ENCOUNTER — Other Ambulatory Visit: Payer: Self-pay

## 2020-10-16 ENCOUNTER — Encounter: Payer: Self-pay | Admitting: Family Medicine

## 2020-10-16 ENCOUNTER — Ambulatory Visit (INDEPENDENT_AMBULATORY_CARE_PROVIDER_SITE_OTHER): Payer: Medicare HMO | Admitting: Family Medicine

## 2020-10-16 ENCOUNTER — Telehealth: Payer: Self-pay

## 2020-10-16 VITALS — BP 128/76 | HR 72 | Temp 96.1°F | Resp 16 | Ht 65.0 in | Wt 212.4 lb

## 2020-10-16 DIAGNOSIS — E038 Other specified hypothyroidism: Secondary | ICD-10-CM | POA: Diagnosis not present

## 2020-10-16 DIAGNOSIS — R7303 Prediabetes: Secondary | ICD-10-CM | POA: Diagnosis not present

## 2020-10-16 DIAGNOSIS — Z6835 Body mass index (BMI) 35.0-35.9, adult: Secondary | ICD-10-CM

## 2020-10-16 DIAGNOSIS — Z23 Encounter for immunization: Secondary | ICD-10-CM

## 2020-10-16 DIAGNOSIS — E782 Mixed hyperlipidemia: Secondary | ICD-10-CM | POA: Diagnosis not present

## 2020-10-16 DIAGNOSIS — I69398 Other sequelae of cerebral infarction: Secondary | ICD-10-CM | POA: Diagnosis not present

## 2020-10-16 DIAGNOSIS — T466X5A Adverse effect of antihyperlipidemic and antiarteriosclerotic drugs, initial encounter: Secondary | ICD-10-CM

## 2020-10-16 DIAGNOSIS — G43809 Other migraine, not intractable, without status migrainosus: Secondary | ICD-10-CM

## 2020-10-16 DIAGNOSIS — M791 Myalgia, unspecified site: Secondary | ICD-10-CM | POA: Diagnosis not present

## 2020-10-16 DIAGNOSIS — I10 Essential (primary) hypertension: Secondary | ICD-10-CM | POA: Diagnosis not present

## 2020-10-16 DIAGNOSIS — M797 Fibromyalgia: Secondary | ICD-10-CM | POA: Diagnosis not present

## 2020-10-16 DIAGNOSIS — R7989 Other specified abnormal findings of blood chemistry: Secondary | ICD-10-CM | POA: Diagnosis not present

## 2020-10-16 DIAGNOSIS — H53469 Homonymous bilateral field defects, unspecified side: Secondary | ICD-10-CM

## 2020-10-16 MED ORDER — TOPIRAMATE 25 MG PO TABS: ORAL_TABLET | ORAL | 0 refills | Status: DC

## 2020-10-16 NOTE — Progress Notes (Signed)
Subjective:  Patient ID: Caitlin Ho, female    DOB: 03/12/1955  Age: 65 y.o. MRN: [id number]  Chief Complaint  Patient presents with   Hypertension   Hyperlipidemia    HPI PreDiabetes:  Complications: none Most recent A1C: 6.0 Current medications: none Foot checks: none Lifestyle changes:  tries to eat healthy  Hyperlipidemia:  Current medications: fenofibrate Lifestyle changes:tries to eat healthy. Unable to exercise.   Hypertension: Complications: stroke with vision defect. Current medications: metoprolol, lasix.  Chronic back pain with neuropathy. On tramadol, gabpentin, and methocarbamol.  Hypothyroidism: on levothyroxine 125 mcg once daily.   Current Outpatient Medications on File Prior to Visit  Medication Sig Dispense Refill   aspirin EC 81 MG tablet Take 1 tablet (81 mg total) by mouth daily. 30 tablet 11   cetirizine (ZYRTEC) 10 MG tablet Take 10 mg by mouth daily as needed for allergies.     fenofibrate 160 MG tablet Take 1 tablet (160 mg total) by mouth daily. 90 tablet 1   furosemide (LASIX) 40 MG tablet TAKE 1 TABLET EVERY DAY 90 tablet 1   gabapentin (NEURONTIN) 600 MG tablet TAKE 1 TABLET THREE TIMES DAILY (Patient taking differently: Take 600 mg by mouth 3 (three) times daily.) 270 tablet 1   levothyroxine (SYNTHROID) 125 MCG tablet TAKE 1 TABLET EVERY DAY BEFORE BREAKFAST (Patient taking differently: Take 125 mcg by mouth daily before breakfast.) 90 tablet 1   methocarbamol (ROBAXIN) 500 MG tablet TAKE 1 TABLET THREE TIMES DAILY 270 tablet 1   metoprolol tartrate (LOPRESSOR) 25 MG tablet TAKE 1 TABLET TWICE DAILY 180 tablet 1   omeprazole (PRILOSEC) 40 MG capsule TAKE 1 CAPSULE EVERY DAY 90 capsule 0   polyethylene glycol (MIRALAX / GLYCOLAX) 17 g packet Take 17 g by mouth daily as needed for moderate constipation.     traMADol (ULTRAM) 50 MG tablet TAKE 2 TABLETS (100 MG TOTAL) BY MOUTH IN THE MORNING, AT NOON, AND AT BEDTIME. 180 tablet 0   No current  facility-administered medications on file prior to visit.   Past Medical History:  Diagnosis Date   Cancer ([place])    cervical cancer cells whwn patient was 22    Cervicalgia    Fibromyalgia    GERD (gastroesophageal reflux disease)    not on medication   Hashimoto's disease    Headache(784.0)    sinus   Homonymous hemianopia    Hyperlipidemia    Hypertension    Hypothyroid    Other insomnia    Other sequelae of cerebral infarction    Palpitations    Pneumonia    PONV (postoperative nausea and vomiting)    06/20/14 patient reported that BP dropped real low and she had N/V  and was slow to awaken when she had surgery at an outpatient center.   Pre-diabetes    Prediabetes    Seasonal allergies    Sleep apnea    no CPAP   Stroke ([facility])    Supraventricular tachycardia ([facility])    Vitamin D deficiency    Past Surgical History:  Procedure Laterality Date   ABDOMINAL HYSTERECTOMY     APPENDECTOMY  1978   BACK SURGERY     CERVICAL FUSION  2017   2 levels   DILATION AND CURETTAGE OF UTERUS     LUMBAR DISC SURGERY  10/03/2012   Dr Ellene Route  2 Discectomy   LUMBAR DISC SURGERY  07/2012   LUMBAR FUSION  2015   Fusion (2 levels)  LUMBAR FUSION  2016   fusion 1 level.   LUMBAR FUSION  02/16/2013   Fusion lumbar.   THYROIDECTOMY  2006   THYROIDECTOMY  2006   TUBAL LIGATION      Family History  Problem Relation Age of Onset   Aneurysm Mother        brain   Heart attack Father 50   Cancer Paternal Grandmother        Breast   Hyperlipidemia Other    Hypertension Other    Diabetes Other    Social History   Socioeconomic History   Marital status: Married    Spouse name: Not on file   Number of children: 1   Years of education: Not on file   Highest education level: Not on file  Occupational History   Occupation: Disabled  Tobacco Use   Smoking status: Never   Smokeless tobacco: Never  Vaping Use   Vaping Use: Never used  Substance and Sexual Activity   Alcohol use:  No   Drug use: No   Sexual activity: Yes    Partners: Male    Birth control/protection: None  Other Topics Concern   Not on file  Social History Narrative   Not on file   Social Determinants of Health   Financial Resource Strain: Low Risk    Difficulty of Paying Living Expenses: Not hard at all  Food Insecurity: No Food Insecurity   Worried About Charity fundraiser in the Last Year: Never true   [place] in the Last Year: Never true  Transportation Needs: No Transportation Needs   Lack of Transportation (Medical): No   Lack of Transportation (Non-Medical): No  Physical Activity: Inactive   Days of Exercise per Week: 0 days   Minutes of Exercise per Session: 0 min  Stress: No Stress Concern Present   Feeling of Stress : Not at all  Social Connections: Socially Integrated   Frequency of Communication with Friends and Family: More than three times a week   Frequency of Social Gatherings with Friends and Family: Once a week   Attends Religious Services: More than 4 times per year   Active Member of [employer] or Organizations: Yes   Attends Music therapist: More than 4 times per year   Marital Status: Married    Review of Systems  Constitutional:  Negative for chills, fatigue and fever.  HENT:  Negative for congestion, rhinorrhea and sore throat.   Respiratory:  Negative for cough and shortness of breath.   Cardiovascular:  Negative for chest pain.  Gastrointestinal:  Negative for abdominal pain, constipation, diarrhea, nausea and vomiting.  Endocrine: Negative for polydipsia and polyphagia.  Genitourinary:  Negative for dysuria and urgency.  Musculoskeletal:  Positive for arthralgias, back pain and myalgias.  Neurological:  Positive for headaches (Left temple. Daily. Comes and goes. Takes advil or migraine excedren. Pt has associated nausea, but no vomiting. Pt has photophobia and phonophobi. Going to sleep is what generally helps.). Negative for dizziness,  weakness and light-headedness.  Psychiatric/Behavioral:  Negative for dysphoric mood. The patient is not nervous/anxious.     Objective:  BP 128/76   Pulse 72   Temp (!) 96.1 F (35.6 C)   Resp 16   Ht '5\' 5"'$  (1.651 m)   Wt 212 lb 6.4 oz (96.3 kg)   LMP  (LMP Unknown)   BMI 35.35 kg/m   BP/Weight 10/16/2020 07/01/2020 [id number]  Systolic BP 0000000 123456 -  Diastolic  BP 76 64 -  Wt. (Lbs) 212.4 - 215  BMI 35.35 - 35.78    Physical Exam Vitals reviewed.  Constitutional:      General: She is not in acute distress.    Appearance: Normal appearance. She is obese.  HENT:     Right Ear: Tympanic membrane and ear canal normal.     Left Ear: Tympanic membrane and ear canal normal.     Nose: Nose normal. No congestion or rhinorrhea.  Eyes:     Conjunctiva/sclera: Conjunctivae normal.  Neck:     Thyroid: No thyroid mass.     Vascular: No carotid bruit.  Cardiovascular:     Rate and Rhythm: Normal rate and regular rhythm.     Pulses: Normal pulses.     Heart sounds: Normal heart sounds. No murmur heard. Pulmonary:     Effort: Pulmonary effort is normal.     Breath sounds: Normal breath sounds.  Abdominal:     General: Bowel sounds are normal.     Palpations: Abdomen is soft. There is no mass.     Tenderness: There is no abdominal tenderness.  Musculoskeletal:        General: Tenderness (lumbar) present.  Lymphadenopathy:     Cervical: No cervical adenopathy.  Neurological:     Mental Status: She is alert and oriented to person, place, and time.     Cranial Nerves: No cranial nerve deficit.  Psychiatric:        Mood and Affect: Mood normal.        Behavior: Behavior normal.    Diabetic Foot Exam - Simple   No data filed      Lab Results  Component Value Date   WBC 9.5 10/16/2020   HGB 14.4 10/16/2020   HCT 43.6 10/16/2020   PLT 359 10/16/2020   GLUCOSE 119 (H) 10/16/2020   CHOL 175 10/16/2020   TRIG 161 (H) 10/16/2020   HDL 42 10/16/2020   LDLCALC 105 (H)  10/16/2020   ALT 15 10/16/2020   AST 22 10/16/2020   NA 142 10/16/2020   K 5.3 (H) 10/16/2020   CL 103 10/16/2020   CREATININE 0.88 10/16/2020   BUN 14 10/16/2020   CO2 25 10/16/2020   TSH 4.890 (H) 10/16/2020   HGBA1C 6.3 (H) 10/16/2020      Assessment & Plan:   1. Essential hypertension, benign Well controlled.  No changes to medicines.  Continue to work on eating a healthy diet and exercise.  Labs drawn today.  - CBC with Differential/Platelet - Comprehensive metabolic panel  2. Prediabetes Recommend continue to work on eating healthy diet and exercise. - Hemoglobin A1c  3. Secondary hypothyroidism Added on Free T4 after tsh came back little up.  - TSH  4. Mixed hyperlipidemia Not quite at goal  Recommend add zetia.  Continue to work on eating a healthy diet .  Labs drawn today.  - Lipid panel  5. Class 2 severe obesity due to excess calories with serious comorbidity and body mass index (BMI) of 35.0 to 35.9 in adult [facility]) Recommend continue to work on eating healthy diet and exercise.  6. Fibromyalgia Stable  7. Other migraine without status migrainosus, not intractable Start on topamax titration.   8. 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  9. Myalgias due to statins. Intolerant.  Meds ordered this encounter  Medications   topiramate (TOPAMAX) 25 MG tablet    Sig: Take 1 tablet (25 mg total) by  mouth daily for 7 days, THEN 2 tablets (50 mg total) daily for 24 days.    Dispense:  55 tablet    Refill:  0    Orders Placed This Encounter  Procedures   CBC with Differential/Platelet   Hemoglobin A1c   Comprehensive metabolic panel   Lipid panel   TSH   Cardiovascular Risk Assessment     Follow-up: Return in about 3 months (around 01/16/2021) for fasting.  An After Visit Summary was printed and given to the patient.  Rochel Brome, MD [facility] [phone]

## 2020-10-16 NOTE — Chronic Care Management (AMB) (Signed)
Error

## 2020-10-17 ENCOUNTER — Other Ambulatory Visit: Payer: Self-pay

## 2020-10-17 DIAGNOSIS — R7989 Other specified abnormal findings of blood chemistry: Secondary | ICD-10-CM

## 2020-10-17 LAB — CBC WITH DIFFERENTIAL/PLATELET
Basophils Absolute: 0.2 10*3/uL (ref 0.0–0.2)
Basos: 2 %
EOS (ABSOLUTE): 0.4 10*3/uL (ref 0.0–0.4)
Eos: 5 %
Hematocrit: 43.6 % (ref 34.0–46.6)
Hemoglobin: 14.4 g/dL (ref 11.1–15.9)
Immature Grans (Abs): 0.1 10*3/uL (ref 0.0–0.1)
Immature Granulocytes: 1 %
Lymphocytes Absolute: 3.6 10*3/uL — ABNORMAL HIGH (ref 0.7–3.1)
Lymphs: 38 %
MCH: 28.8 pg (ref 26.6–33.0)
MCHC: 33 g/dL (ref 31.5–35.7)
MCV: 87 fL (ref 79–97)
Monocytes Absolute: 0.7 10*3/uL (ref 0.1–0.9)
Monocytes: 8 %
Neutrophils Absolute: 4.6 10*3/uL (ref 1.4–7.0)
Neutrophils: 46 %
Platelets: 359 10*3/uL (ref 150–450)
RBC: 5 x10E6/uL (ref 3.77–5.28)
RDW: 12.2 % (ref 11.7–15.4)
WBC: 9.5 10*3/uL (ref 3.4–10.8)

## 2020-10-17 LAB — LIPID PANEL
Chol/HDL Ratio: 4.2 ratio (ref 0.0–4.4)
Cholesterol, Total: 175 mg/dL (ref 100–199)
HDL: 42 mg/dL (ref >39)
LDL Chol Calc (NIH): 105 mg/dL — ABNORMAL HIGH (ref 0–99)
Triglycerides: 161 mg/dL — ABNORMAL HIGH (ref 0–149)
VLDL Cholesterol Cal: 28 mg/dL (ref 5–40)

## 2020-10-17 LAB — COMPREHENSIVE METABOLIC PANEL
ALT: 15 IU/L (ref 0–32)
AST: 22 IU/L (ref 0–40)
Albumin/Globulin Ratio: 1.6 (ref 1.2–2.2)
Albumin: 4 g/dL (ref 3.8–4.8)
Alkaline Phosphatase: 92 IU/L (ref 44–121)
BUN/Creatinine Ratio: 16 (ref 12–28)
BUN: 14 mg/dL (ref 8–27)
Bilirubin Total: 0.3 mg/dL (ref 0.0–1.2)
CO2: 25 mmol/L (ref 20–29)
Calcium: 9.4 mg/dL (ref 8.7–10.3)
Chloride: 103 mmol/L (ref 96–106)
Creatinine, Ser: 0.88 mg/dL (ref 0.57–1.00)
Globulin, Total: 2.5 g/dL (ref 1.5–4.5)
Glucose: 119 mg/dL — ABNORMAL HIGH (ref 65–99)
Potassium: 5.3 mmol/L — ABNORMAL HIGH (ref 3.5–5.2)
Sodium: 142 mmol/L (ref 134–144)
Total Protein: 6.5 g/dL (ref 6.0–8.5)
eGFR: 74 mL/min/{1.73_m2} (ref >59)

## 2020-10-17 LAB — HEMOGLOBIN A1C
Est. average glucose Bld gHb Est-mCnc: 134 mg/dL
Hgb A1c MFr Bld: 6.3 % — ABNORMAL HIGH (ref 4.8–5.6)

## 2020-10-17 LAB — TSH: TSH: 4.89 u[IU]/mL — ABNORMAL HIGH (ref 0.450–4.500)

## 2020-10-17 LAB — CARDIOVASCULAR RISK ASSESSMENT

## 2020-10-18 ENCOUNTER — Other Ambulatory Visit: Payer: Self-pay | Admitting: Family Medicine

## 2020-10-19 ENCOUNTER — Other Ambulatory Visit: Payer: Self-pay

## 2020-10-19 LAB — T4, FREE: Free T4: 1.93 ng/dL — ABNORMAL HIGH (ref 0.82–1.77)

## 2020-10-19 LAB — SPECIMEN STATUS REPORT

## 2020-10-19 MED ORDER — LEVOTHYROXINE SODIUM 112 MCG PO TABS: 112.0000 ug | ORAL_TABLET | Freq: Every day | ORAL | 3 refills | Status: DC

## 2020-10-23 ENCOUNTER — Other Ambulatory Visit: Payer: Self-pay | Admitting: Physician Assistant

## 2020-10-25 DIAGNOSIS — M79604 Pain in right leg: Secondary | ICD-10-CM | POA: Diagnosis not present

## 2020-10-25 DIAGNOSIS — M545 Low back pain, unspecified: Secondary | ICD-10-CM | POA: Diagnosis not present

## 2020-10-25 DIAGNOSIS — R2689 Other abnormalities of gait and mobility: Secondary | ICD-10-CM | POA: Diagnosis not present

## 2020-10-25 DIAGNOSIS — M79651 Pain in right thigh: Secondary | ICD-10-CM | POA: Diagnosis not present

## 2020-11-01 DIAGNOSIS — G8929 Other chronic pain: Secondary | ICD-10-CM | POA: Diagnosis not present

## 2020-11-01 DIAGNOSIS — R519 Headache, unspecified: Secondary | ICD-10-CM | POA: Insufficient documentation

## 2020-11-01 DIAGNOSIS — M47892 Other spondylosis, cervical region: Secondary | ICD-10-CM | POA: Diagnosis not present

## 2020-11-01 DIAGNOSIS — Z6834 Body mass index (BMI) 34.0-34.9, adult: Secondary | ICD-10-CM | POA: Diagnosis not present

## 2020-11-07 ENCOUNTER — Other Ambulatory Visit: Payer: Self-pay | Admitting: Family Medicine

## 2020-11-07 DIAGNOSIS — G43809 Other migraine, not intractable, without status migrainosus: Secondary | ICD-10-CM

## 2020-11-09 DIAGNOSIS — H2513 Age-related nuclear cataract, bilateral: Secondary | ICD-10-CM | POA: Diagnosis not present

## 2020-11-09 DIAGNOSIS — H524 Presbyopia: Secondary | ICD-10-CM | POA: Diagnosis not present

## 2020-11-09 DIAGNOSIS — H53453 Other localized visual field defect, bilateral: Secondary | ICD-10-CM | POA: Diagnosis not present

## 2020-11-09 DIAGNOSIS — E119 Type 2 diabetes mellitus without complications: Secondary | ICD-10-CM | POA: Diagnosis not present

## 2020-11-09 LAB — HM DIABETES EYE EXAM

## 2020-11-15 DIAGNOSIS — R519 Headache, unspecified: Secondary | ICD-10-CM | POA: Diagnosis not present

## 2020-11-16 ENCOUNTER — Other Ambulatory Visit: Payer: Self-pay

## 2020-11-16 ENCOUNTER — Ambulatory Visit (INDEPENDENT_AMBULATORY_CARE_PROVIDER_SITE_OTHER): Payer: Medicare HMO | Admitting: Family Medicine

## 2020-11-16 VITALS — BP 120/76 | HR 76 | Temp 96.9°F | Resp 16 | Ht 65.0 in | Wt 213.0 lb

## 2020-11-16 DIAGNOSIS — E278 Other specified disorders of adrenal gland: Secondary | ICD-10-CM

## 2020-11-16 DIAGNOSIS — E782 Mixed hyperlipidemia: Secondary | ICD-10-CM

## 2020-11-16 DIAGNOSIS — R519 Headache, unspecified: Secondary | ICD-10-CM | POA: Diagnosis not present

## 2020-11-16 DIAGNOSIS — R7309 Other abnormal glucose: Secondary | ICD-10-CM

## 2020-11-16 DIAGNOSIS — I10 Essential (primary) hypertension: Secondary | ICD-10-CM | POA: Diagnosis not present

## 2020-11-16 NOTE — Progress Notes (Signed)
Subjective:  Patient ID: Caitlin Ho, female    DOB: March 10, 1955  Age: 65 y.o. MRN: [id number]  Chief Complaint  Patient presents with   Headache    HPI Migraines/headaches: started on topamax 4 weeks ago. Took it until one week ago. Pt had side effects which included gum pain, breast tenderness, fatigue, and constipation. All sxs have improved or resolved since discontinuation of topomax. Pt saw Dr. Ellene Route and he ordered mri of brain which was done yesterday. Still having headaches three times a week.  Loss of left peripheral vision since stroke in 2019.   Hyperlipidemia/Prediabetes: Pt does not want metformin or zetia right now. States she had surgery and was not eating the best and wants to give it more time to get back on track.   Current Outpatient Medications on File Prior to Visit  Medication Sig Dispense Refill   aspirin EC 81 MG tablet Take 1 tablet (81 mg total) by mouth daily. 30 tablet 11   cetirizine (ZYRTEC) 10 MG tablet Take 10 mg by mouth daily as needed for allergies.     fenofibrate 160 MG tablet Take 1 tablet (160 mg total) by mouth daily. 90 tablet 1   furosemide (LASIX) 40 MG tablet TAKE 1 TABLET EVERY DAY 90 tablet 1   gabapentin (NEURONTIN) 600 MG tablet Take 1 tablet (600 mg total) by mouth 3 (three) times daily. 270 tablet 3   levothyroxine (SYNTHROID) 112 MCG tablet Take 1 tablet (112 mcg total) by mouth daily. 90 tablet 3   methocarbamol (ROBAXIN) 500 MG tablet TAKE 1 TABLET THREE TIMES DAILY 270 tablet 1   metoprolol tartrate (LOPRESSOR) 25 MG tablet TAKE 1 TABLET TWICE DAILY 180 tablet 1   omeprazole (PRILOSEC) 40 MG capsule TAKE 1 CAPSULE EVERY DAY 90 capsule 0   polyethylene glycol (MIRALAX / GLYCOLAX) 17 g packet Take 17 g by mouth daily as needed for moderate constipation.     No current facility-administered medications on file prior to visit.   Past Medical History:  Diagnosis Date   Cancer ([place])    cervical cancer cells whwn patient was 22     Cervicalgia    Fibromyalgia    GERD (gastroesophageal reflux disease)    not on medication   Hashimoto's disease    Headache(784.0)    sinus   Homonymous hemianopia    Hyperlipidemia    Hypertension    Hypothyroid    Other insomnia    Other sequelae of cerebral infarction    Palpitations    Pneumonia    PONV (postoperative nausea and vomiting)    06/20/14 patient reported that BP dropped real low and she had N/V  and was slow to awaken when she had surgery at an outpatient center.   Pre-diabetes    Prediabetes    Seasonal allergies    Sleep apnea    no CPAP   Stroke ([place])    Supraventricular tachycardia ([place])    Vitamin D deficiency    Past Surgical History:  Procedure Laterality Date   ABDOMINAL HYSTERECTOMY     APPENDECTOMY  1978   BACK SURGERY     CERVICAL FUSION  2017   2 levels   DILATION AND CURETTAGE OF UTERUS     LUMBAR DISC SURGERY  10/03/2012   Dr Ellene Route  2 Discectomy   LUMBAR [place] SURGERY  07/2012   LUMBAR FUSION  2015   Fusion (2 levels)   LUMBAR FUSION  2016   fusion 1 level.  LUMBAR FUSION  02/16/2013   Fusion lumbar.   THYROIDECTOMY  2006   THYROIDECTOMY  2006   TUBAL LIGATION      Family History  Problem Relation Age of Onset   Aneurysm Mother        brain   Heart attack Father 56   Cancer Paternal Grandmother        Breast   Hyperlipidemia Other    Hypertension Other    Diabetes Other    Social History   Socioeconomic History   Marital status: Married    Spouse name: Not on file   Number of children: 1   Years of education: Not on file   Highest education level: Not on file  Occupational History   Occupation: Disabled  Tobacco Use   Smoking status: Never   Smokeless tobacco: Never  Vaping Use   Vaping Use: Never used  Substance and Sexual Activity   Alcohol use: No   Drug use: No   Sexual activity: Yes    Partners: Male    Birth control/protection: None  Other Topics Concern   Not on file  Social History Narrative    Not on file   Social Determinants of Health   Financial Resource Strain: Low Risk    Difficulty of Paying Living Expenses: Not hard at all  Food Insecurity: No Food Insecurity   Worried About Charity fundraiser in the Last Year: Never true   [place] in the Last Year: Never true  Transportation Needs: No Transportation Needs   Lack of Transportation (Medical): No   Lack of Transportation (Non-Medical): No  Physical Activity: Inactive   Days of Exercise per Week: 0 days   Minutes of Exercise per Session: 0 min  Stress: No Stress Concern Present   Feeling of Stress : Not at all  Social Connections: Socially Integrated   Frequency of Communication with Friends and Family: More than three times a week   Frequency of Social Gatherings with Friends and Family: Once a week   Attends Religious Services: More than 4 times per year   Active Member of [employer] or Organizations: Yes   Attends Music therapist: More than 4 times per year   Marital Status: Married    Review of Systems  Constitutional:  Negative for chills, fatigue and fever.  HENT:  Negative for congestion, rhinorrhea and sore throat.   Respiratory:  Negative for cough and shortness of breath.   Cardiovascular:  Negative for chest pain.  Gastrointestinal:  Negative for abdominal pain, constipation, diarrhea, nausea and vomiting.  Genitourinary:  Negative for dysuria and urgency.  Musculoskeletal:  Positive for back pain. Negative for myalgias.  Neurological:  Positive for headaches. Negative for dizziness, weakness and light-headedness.  Psychiatric/Behavioral:  Negative for dysphoric mood. The patient is not nervous/anxious.     Objective:  BP 120/76   Pulse 76   Temp (!) 96.9 F (36.1 C)   Resp 16   Ht '5\' 5"'$  (1.651 m)   Wt 213 lb (96.6 kg)   LMP  (LMP Unknown)   BMI 35.45 kg/m   BP/Weight 11/16/2020 10/16/2020 [id number]  Systolic BP 123456 0000000 123456  Diastolic BP 76 76 64  Wt. (Lbs) 213 212.4 -   BMI 35.45 35.35 -    Physical Exam Vitals reviewed.  Constitutional:      Appearance: Normal appearance. She is normal weight.  HENT:     Right Ear: Tympanic membrane, ear canal and external  ear normal.     Left Ear: Tympanic membrane, ear canal and external ear normal.     Nose: Nose normal.     Mouth/Throat:     Pharynx: Oropharynx is clear.  Neck:     Vascular: No carotid bruit.  Cardiovascular:     Rate and Rhythm: Normal rate and regular rhythm.     Pulses: Normal pulses.     Heart sounds: Normal heart sounds. No murmur heard. Pulmonary:     Effort: Pulmonary effort is normal. No respiratory distress.     Breath sounds: Normal breath sounds.  Abdominal:     General: Abdomen is flat. Bowel sounds are normal.     Palpations: Abdomen is soft.     Tenderness: There is no abdominal tenderness.  Neurological:     Mental Status: She is alert and oriented to person, place, and time.  Psychiatric:        Mood and Affect: Mood normal.        Behavior: Behavior normal.    Diabetic Foot Exam - Simple   No data filed      Lab Results  Component Value Date   WBC 9.5 10/16/2020   HGB 14.4 10/16/2020   HCT 43.6 10/16/2020   PLT 359 10/16/2020   GLUCOSE 119 (H) 10/16/2020   CHOL 175 10/16/2020   TRIG 161 (H) 10/16/2020   HDL 42 10/16/2020   LDLCALC 105 (H) 10/16/2020   ALT 15 10/16/2020   AST 22 10/16/2020   NA 142 10/16/2020   K 5.3 (H) 10/16/2020   CL 103 10/16/2020   CREATININE 0.88 10/16/2020   BUN 14 10/16/2020   CO2 25 10/16/2020   TSH 4.890 (H) 10/16/2020   HGBA1C 6.3 (H) 10/16/2020      Assessment & Plan:   Problem List Items Addressed This Visit       Cardiovascular and Mediastinum   Essential hypertension, benign    The current medical regimen is effective;  continue present plan and medications.         Other   Mixed hyperlipidemia    Low-fat diet.  Was to wait on Zetia.      Impaired glucose metabolism    Low sugar/carbohydrate  diet.      Adrenal mass ([place])    Noted on ct scan in 2016 to be a benign adenoma.      Left temporal headache - Primary    Sed rate normal. Temporal arteritis ruled out.  Await MRI of brain ordered by Dr. Ellene Route. Defer mgmt to him at this point.       Relevant Orders   Sedimentation rate (Completed)  .  No orders of the defined types were placed in this encounter.   Orders Placed This Encounter  Procedures   Sedimentation rate     Follow-up: No follow-ups on file.  An After Visit Summary was printed and given to the patient.  Rochel Brome, MD [facility] [phone]

## 2020-11-17 LAB — SEDIMENTATION RATE: Sed Rate: 5 mm/hr (ref 0–40)

## 2020-11-22 DIAGNOSIS — R519 Headache, unspecified: Secondary | ICD-10-CM | POA: Diagnosis not present

## 2020-11-24 ENCOUNTER — Other Ambulatory Visit: Payer: Self-pay | Admitting: Family Medicine

## 2020-11-27 ENCOUNTER — Encounter: Payer: Self-pay | Admitting: Family Medicine

## 2020-11-27 DIAGNOSIS — E278 Other specified disorders of adrenal gland: Secondary | ICD-10-CM | POA: Insufficient documentation

## 2020-11-27 DIAGNOSIS — R519 Headache, unspecified: Secondary | ICD-10-CM | POA: Insufficient documentation

## 2020-11-27 HISTORY — DX: Other specified disorders of adrenal gland: E27.8

## 2020-11-27 NOTE — Assessment & Plan Note (Signed)
The current medical regimen is effective;  continue present plan and medications.  

## 2020-11-27 NOTE — Assessment & Plan Note (Signed)
Sed rate normal. Temporal arteritis ruled out.  Await MRI of brain ordered by Dr. Ellene Route. Defer mgmt to him at this point.

## 2020-11-27 NOTE — Assessment & Plan Note (Signed)
Low sugar/carbohydrate diet.

## 2020-11-27 NOTE — Assessment & Plan Note (Signed)
Noted on ct scan in 2016 to be a benign adenoma.

## 2020-11-27 NOTE — Assessment & Plan Note (Signed)
Low-fat diet.  Was to wait on Zetia.

## 2020-12-21 ENCOUNTER — Other Ambulatory Visit: Payer: Self-pay | Admitting: Physician Assistant

## 2020-12-26 ENCOUNTER — Other Ambulatory Visit: Payer: Self-pay | Admitting: Family Medicine

## 2021-01-01 ENCOUNTER — Telehealth: Payer: Self-pay

## 2021-01-01 NOTE — Chronic Care Management (AMB) (Signed)
Updated [place] report with BP readings.   Caitlin Ho, [place] Pharmacist Assistant  [phone]

## 2021-01-22 ENCOUNTER — Encounter: Payer: Self-pay | Admitting: Physician Assistant

## 2021-01-22 ENCOUNTER — Other Ambulatory Visit: Payer: Self-pay

## 2021-01-22 ENCOUNTER — Ambulatory Visit (INDEPENDENT_AMBULATORY_CARE_PROVIDER_SITE_OTHER): Payer: Medicare HMO | Admitting: Physician Assistant

## 2021-01-22 VITALS — BP 110/74 | HR 80 | Temp 96.8°F | Resp 18 | Ht 65.0 in | Wt 208.0 lb

## 2021-01-22 DIAGNOSIS — E038 Other specified hypothyroidism: Secondary | ICD-10-CM

## 2021-01-22 DIAGNOSIS — Z23 Encounter for immunization: Secondary | ICD-10-CM

## 2021-01-22 DIAGNOSIS — E782 Mixed hyperlipidemia: Secondary | ICD-10-CM | POA: Diagnosis not present

## 2021-01-22 DIAGNOSIS — I10 Essential (primary) hypertension: Secondary | ICD-10-CM | POA: Diagnosis not present

## 2021-01-22 DIAGNOSIS — R7303 Prediabetes: Secondary | ICD-10-CM

## 2021-01-22 NOTE — Progress Notes (Signed)
Subjective:  Patient ID: Caitlin Ho, female    DOB: 1955-05-19  Age: 65 y.o. MRN: [id number]  Chief Complaint  Patient presents with   Hypertension   Hypothyroidism    HPI  Pt presents for follow up of hypertension. The patient is tolerating the medication well without side effects. Compliance with treatment has been good; including taking medication as directed , maintains a healthy diet and regular exercise regimen , and following up as directed. She is currently on lopressor 25mg  bid  and takes lasix 40mg  qd- denies chest pain/dyspnea/edema  Prediabetes, Follow-up  Lab Results  Component Value Date   HGBA1C 6.3 (H) 10/16/2020   HGBA1C 6.0 (H) 05/25/2020   HGBA1C 6.0 (H) 02/22/2020   GLUCOSE 119 (H) 10/16/2020   GLUCOSE 109 (H) 06/30/2020   GLUCOSE 112 (H) 06/27/2020    Last seen for for this3 months ago.  Management since that visit includes watching diet.  Pt with history of hypothyroidism - currently on synthroid 112 mcg qd - due for labwork   Pt with history of GERD - currently taking prilosec 40mg  qd - states symptoms well controlled  Mixed hyperlipidemia  Pt presents with hyperlipidemia.  The patient is compliant with medications, maintains a low cholesterol diet , follows up as directed , and maintains an exercise regimen . The patient denies experiencing any hypercholesterolemia related symptoms. Currenlty on fenofibrate 160mg  qd  Pt with history of chronic back pain with neuropathy - symptoms controlled with medication - currently on tramadol, gabapentin and methocarbamol  Pt states the headaches she had been having have eased up quite a bit and now have gone from daily headaches to only having one every several weeks     Component Value Date/Time   CHOL 175 10/16/2020 0910   TRIG 161 (H) 10/16/2020 0910   CHOLHDL 4.2 10/16/2020 0910   CREATININE 0.88 10/16/2020 0910    Wt Readings from Last 3 Encounters:  01/22/21 208 lb (94.3 kg)  11/16/20 213 lb  (96.6 kg)  10/16/20 212 lb 6.4 oz (96.3 kg)     Current Outpatient Medications on File Prior to Visit  Medication Sig Dispense Refill   aspirin EC 81 MG tablet Take 1 tablet (81 mg total) by mouth daily. 30 tablet 11   cetirizine (ZYRTEC) 10 MG tablet Take 10 mg by mouth daily as needed for allergies.     fenofibrate 160 MG tablet Take 1 tablet (160 mg total) by mouth daily. 90 tablet 1   furosemide (LASIX) 40 MG tablet TAKE 1 TABLET EVERY DAY 90 tablet 1   gabapentin (NEURONTIN) 600 MG tablet Take 1 tablet (600 mg total) by mouth 3 (three) times daily. 270 tablet 3   levothyroxine (SYNTHROID) 112 MCG tablet Take 1 tablet (112 mcg total) by mouth daily. 90 tablet 3   methocarbamol (ROBAXIN) 500 MG tablet TAKE 1 TABLET THREE TIMES DAILY 270 tablet 1   metoprolol tartrate (LOPRESSOR) 25 MG tablet TAKE 1 TABLET TWICE DAILY 180 tablet 1   omeprazole (PRILOSEC) 40 MG capsule TAKE 1 CAPSULE EVERY DAY 90 capsule 0   polyethylene glycol (MIRALAX / GLYCOLAX) 17 g packet Take 17 g by mouth daily as needed for moderate constipation.     traMADol (ULTRAM) 50 MG tablet TAKE 2 TABLETS (100 MG TOTAL) BY MOUTH IN THE MORNING, AT NOON, AND AT BEDTIME. 180 tablet 0   No current facility-administered medications on file prior to visit.   Past Medical History:  Diagnosis Date  Cancer ([place])    cervical cancer cells whwn patient was 22    Cervicalgia    Fibromyalgia    GERD (gastroesophageal reflux disease)    not on medication   Hashimoto's disease    Headache(784.0)    sinus   Homonymous hemianopia    Hyperlipidemia    Hypertension    Hypothyroid    Other insomnia    Other sequelae of cerebral infarction    Palpitations    Pneumonia    PONV (postoperative nausea and vomiting)    06/20/14 patient reported that BP dropped real low and she had N/V  and was slow to awaken when she had surgery at an outpatient center.   Pre-diabetes    Prediabetes    Seasonal allergies    Sleep apnea    no CPAP    Stroke ([place])    Supraventricular tachycardia ([place])    Vitamin D deficiency    Past Surgical History:  Procedure Laterality Date   ABDOMINAL HYSTERECTOMY     APPENDECTOMY  1978   BACK SURGERY     CERVICAL FUSION  2017   2 levels   DILATION AND CURETTAGE OF UTERUS     LUMBAR DISC SURGERY  10/03/2012   Dr Ellene Route  2 Discectomy   LUMBAR [place] SURGERY  07/2012   LUMBAR FUSION  2015   Fusion (2 levels)   LUMBAR FUSION  2016   fusion 1 level.   LUMBAR FUSION  02/16/2013   Fusion lumbar.   THYROIDECTOMY  2006   THYROIDECTOMY  2006   TUBAL LIGATION      Family History  Problem Relation Age of Onset   Aneurysm Mother        brain   Heart attack Father 2   Cancer Paternal Grandmother        Breast   Hyperlipidemia Other    Hypertension Other    Diabetes Other    Social History   Socioeconomic History   Marital status: Married    Spouse name: Not on file   Number of children: 1   Years of education: Not on file   Highest education level: Not on file  Occupational History   Occupation: Disabled  Tobacco Use   Smoking status: Never   Smokeless tobacco: Never  Vaping Use   Vaping Use: Never used  Substance and Sexual Activity   Alcohol use: No   Drug use: No   Sexual activity: Yes    Partners: Male    Birth control/protection: None  Other Topics Concern   Not on file  Social History Narrative   Not on file   Social Determinants of Health   Financial Resource Strain: Low Risk    Difficulty of Paying Living Expenses: Not hard at all  Food Insecurity: No Food Insecurity   Worried About Charity fundraiser in the Last Year: Never true   [place] in the Last Year: Never true  Transportation Needs: No Transportation Needs   Lack of Transportation (Medical): No   Lack of Transportation (Non-Medical): No  Physical Activity: Inactive   Days of Exercise per Week: 0 days   Minutes of Exercise per Session: 0 min  Stress: No Stress Concern Present   Feeling of  Stress : Not at all  Social Connections: Socially Integrated   Frequency of Communication with Friends and Family: More than three times a week   Frequency of Social Gatherings with Friends and Family: Once a week  Attends Religious Services: More than 4 times per year   Active Member of Clubs or Organizations: Yes   Attends Archivist Meetings: More than 4 times per year   Marital Status: Married    Review of Systems  Constitutional:  Negative for chills, fatigue and fever.  HENT:  Negative for congestion, rhinorrhea and sore throat.   Respiratory:  Negative for cough and shortness of breath.   Cardiovascular:  Negative for chest pain.  Gastrointestinal:  Negative for abdominal pain, constipation, diarrhea, nausea and vomiting.  Genitourinary:  Negative for dysuria and urgency.  Musculoskeletal:  Negative for back pain and myalgias.  Neurological:  Positive for headaches. Negative for dizziness, weakness and light-headedness.  Psychiatric/Behavioral:  Negative for dysphoric mood. The patient is not nervous/anxious.     Objective:  BP 110/74   Pulse 80   Temp (!) 96.8 F (36 C)   Resp 18   Ht 5\' 5"  (1.651 m)   Wt 208 lb (94.3 kg)   LMP  (LMP Unknown)   BMI 34.61 kg/m   BP/Weight 01/22/2021 11/16/2020 5/73/2202  Systolic BP 542 706 237  Diastolic BP 74 76 76  Wt. (Lbs) 208 213 212.4  BMI 34.61 35.45 35.35    Physical Exam PHYSICAL EXAM:   VS: BP 110/74   Pulse 80   Temp (!) 96.8 F (36 C)   Resp 18   Ht 5\' 5"  (1.651 m)   Wt 208 lb (94.3 kg)   LMP  (LMP Unknown)   BMI 34.61 kg/m   GEN: Well nourished, well developed, in no acute distress  Cardiac: RRR; no murmurs, rubs, or gallops,no edema -  Respiratory:  normal respiratory rate and pattern with no distress - normal breath sounds with no rales, rhonchi, wheezes or rubs MS: no deformity or atrophy  Skin: warm and dry, no rash  Neuro:  Alert and Oriented x 3, Strength and sensation are intact - CN  II-Xii grossly intact Psych: euthymic mood, appropriate affect and demeanor  Diabetic Foot Exam - Simple   No data filed      Lab Results  Component Value Date   WBC 9.5 10/16/2020   HGB 14.4 10/16/2020   HCT 43.6 10/16/2020   PLT 359 10/16/2020   GLUCOSE 119 (H) 10/16/2020   CHOL 175 10/16/2020   TRIG 161 (H) 10/16/2020   HDL 42 10/16/2020   LDLCALC 105 (H) 10/16/2020   ALT 15 10/16/2020   AST 22 10/16/2020   NA 142 10/16/2020   K 5.3 (H) 10/16/2020   CL 103 10/16/2020   CREATININE 0.88 10/16/2020   BUN 14 10/16/2020   CO2 25 10/16/2020   TSH 4.890 (H) 10/16/2020   HGBA1C 6.3 (H) 10/16/2020      Assessment & Plan:   Problem List Items Addressed This Visit       Cardiovascular and Mediastinum   Essential hypertension, benign - Primary   Relevant Orders   CBC with Differential/Platelet   Comprehensive metabolic panel Continue current meds     Endocrine   Secondary hypothyroidism   Relevant Orders   TSH Continue meds     Other   Mixed hyperlipidemia   Relevant Orders   Lipid panel Continue meds and watch diet      Prediabetes   Relevant Orders   Hemoglobin A1c Watch diet   Other Visit Diagnoses     Need for influenza vaccination       Relevant Orders   Flu Vaccine MDCK QUAD  PF (Completed)     .  No orders of the defined types were placed in this encounter.   Orders Placed This Encounter  Procedures   Flu Vaccine MDCK QUAD PF   CBC with Differential/Platelet   Comprehensive metabolic panel   TSH   Lipid panel   Hemoglobin A1c     Follow-up: Return in about 3 months (around 04/24/2021) for chronic fasting follow up.  An After Visit Summary was printed and given to the patient.  Yetta Flock [facility] [phone]

## 2021-01-23 ENCOUNTER — Other Ambulatory Visit: Payer: Self-pay | Admitting: Physician Assistant

## 2021-01-23 DIAGNOSIS — E038 Other specified hypothyroidism: Secondary | ICD-10-CM

## 2021-01-23 DIAGNOSIS — R899 Unspecified abnormal finding in specimens from other organs, systems and tissues: Secondary | ICD-10-CM

## 2021-01-23 LAB — CBC WITH DIFFERENTIAL/PLATELET
Basophils Absolute: 0.2 10*3/uL (ref 0.0–0.2)
Basos: 2 %
EOS (ABSOLUTE): 0.4 10*3/uL (ref 0.0–0.4)
Eos: 4 %
Hematocrit: 45.8 % (ref 34.0–46.6)
Hemoglobin: 14.9 g/dL (ref 11.1–15.9)
Immature Grans (Abs): 0 10*3/uL (ref 0.0–0.1)
Immature Granulocytes: 0 %
Lymphocytes Absolute: 3.7 10*3/uL — ABNORMAL HIGH (ref 0.7–3.1)
Lymphs: 38 %
MCH: 29.4 pg (ref 26.6–33.0)
MCHC: 32.5 g/dL (ref 31.5–35.7)
MCV: 90 fL (ref 79–97)
Monocytes Absolute: 0.7 10*3/uL (ref 0.1–0.9)
Monocytes: 7 %
Neutrophils Absolute: 4.7 10*3/uL (ref 1.4–7.0)
Neutrophils: 49 %
Platelets: 373 10*3/uL (ref 150–450)
RBC: 5.07 x10E6/uL (ref 3.77–5.28)
RDW: 12.5 % (ref 11.7–15.4)
WBC: 9.8 10*3/uL (ref 3.4–10.8)

## 2021-01-23 LAB — COMPREHENSIVE METABOLIC PANEL
ALT: 13 IU/L (ref 0–32)
AST: 21 IU/L (ref 0–40)
Albumin/Globulin Ratio: 1.7 (ref 1.2–2.2)
Albumin: 4.3 g/dL (ref 3.8–4.8)
Alkaline Phosphatase: 77 IU/L (ref 44–121)
BUN/Creatinine Ratio: 15 (ref 12–28)
BUN: 14 mg/dL (ref 8–27)
Bilirubin Total: 0.3 mg/dL (ref 0.0–1.2)
CO2: 26 mmol/L (ref 20–29)
Calcium: 9.5 mg/dL (ref 8.7–10.3)
Chloride: 102 mmol/L (ref 96–106)
Creatinine, Ser: 0.93 mg/dL (ref 0.57–1.00)
Globulin, Total: 2.5 g/dL (ref 1.5–4.5)
Glucose: 116 mg/dL — ABNORMAL HIGH (ref 70–99)
Potassium: 5.5 mmol/L — ABNORMAL HIGH (ref 3.5–5.2)
Sodium: 143 mmol/L (ref 134–144)
Total Protein: 6.8 g/dL (ref 6.0–8.5)
eGFR: 69 mL/min/{1.73_m2} (ref >59)

## 2021-01-23 LAB — TSH: TSH: 10.4 u[IU]/mL — ABNORMAL HIGH (ref 0.450–4.500)

## 2021-01-23 LAB — LIPID PANEL
Chol/HDL Ratio: 4.2 ratio (ref 0.0–4.4)
Cholesterol, Total: 168 mg/dL (ref 100–199)
HDL: 40 mg/dL (ref >39)
LDL Chol Calc (NIH): 104 mg/dL — ABNORMAL HIGH (ref 0–99)
Triglycerides: 137 mg/dL (ref 0–149)
VLDL Cholesterol Cal: 24 mg/dL (ref 5–40)

## 2021-01-23 LAB — HEMOGLOBIN A1C
Est. average glucose Bld gHb Est-mCnc: 131 mg/dL
Hgb A1c MFr Bld: 6.2 % — ABNORMAL HIGH (ref 4.8–5.6)

## 2021-01-23 LAB — CARDIOVASCULAR RISK ASSESSMENT

## 2021-01-23 MED ORDER — LEVOTHYROXINE SODIUM 125 MCG PO TABS: 125.0000 ug | ORAL_TABLET | Freq: Every day | ORAL | 2 refills | Status: DC

## 2021-01-24 ENCOUNTER — Telehealth: Payer: Self-pay

## 2021-01-24 ENCOUNTER — Other Ambulatory Visit: Payer: Self-pay

## 2021-01-24 DIAGNOSIS — R7989 Other specified abnormal findings of blood chemistry: Secondary | ICD-10-CM

## 2021-01-24 DIAGNOSIS — E038 Other specified hypothyroidism: Secondary | ICD-10-CM

## 2021-01-24 MED ORDER — LEVOTHYROXINE SODIUM 125 MCG PO TABS: 125.0000 ug | ORAL_TABLET | Freq: Every day | ORAL | 0 refills | Status: DC

## 2021-01-24 NOTE — Progress Notes (Signed)
    Chronic Care Management Pharmacy Assistant   Name: Caitlin Ho  MRN: [id number] DOB: May 20, 1955   Reason for Encounter: Disease State/ General Adherence Call   Recent office visits:   01/22/2021 Caitlin Duncans PA-C (PCP)  no medication changes noted   11/16/2020 Caitlin Brome MD (PCP) stopped Topiramate  10/19/2020 Caitlin Ho CMA Caitlin Ho (PCP)  Orders only- stopped Synthroid   10/19/2020 [facility]aitlin [facility] (PCP)- given Pfizer Vaccine  10/16/2020 Caitlin Brome MD (PCP) -start Topiramate 25mg  daily for 7 days then increase to 50mg  daily for 24 days, stop Ergocalciferol, Hydrocodone-Acetaminophen   Hospital visits:  None    Contacted Caitlin Ho for general disease state and medication adherence call.   Patient is not > 5 days past due for refill on the following medications per chart history:    Called patient  01/30/21 left message  Called patient 01/31/21 left message    Medications: Outpatient Encounter Medications as of 01/24/2021  Medication Sig   aspirin EC 81 MG tablet Take 1 tablet (81 mg total) by mouth daily.   cetirizine (ZYRTEC) 10 MG tablet Take 10 mg by mouth daily as needed for allergies.   fenofibrate 160 MG tablet Take 1 tablet (160 mg total) by mouth daily.   furosemide (LASIX) 40 MG tablet TAKE 1 TABLET EVERY DAY   gabapentin (NEURONTIN) 600 MG tablet Take 1 tablet (600 mg total) by mouth 3 (three) times daily.   levothyroxine (SYNTHROID) 125 MCG tablet Take 1 tablet (125 mcg total) by mouth daily.   methocarbamol (ROBAXIN) 500 MG tablet TAKE 1 TABLET THREE TIMES DAILY   metoprolol tartrate (LOPRESSOR) 25 MG tablet TAKE 1 TABLET TWICE DAILY   omeprazole (PRILOSEC) 40 MG capsule TAKE 1 CAPSULE EVERY DAY   polyethylene glycol (MIRALAX / GLYCOLAX) 17 g packet Take 17 g by mouth daily as needed for moderate constipation.   traMADol (ULTRAM) 50 MG tablet TAKE 2 TABLETS (100 MG TOTAL) BY MOUTH IN THE MORNING, AT NOON, AND AT BEDTIME.   No  facility-administered encounter medications on file as of 01/24/2021.    Care Gaps:  Never Done URINE MICROALBUMIN (Yearly)  Never Done HIV Screening (Once) Never Done Hepatitis C Screening (Once) Never Done Zoster Vaccines- Shingrix (1 of 2) Jan 13 2019 Pneumococcal Vaccine 32-21 Years old (2 - PCV) Last completed: Jan 12, 2018 [facility] 2 2022 MAMMOGRAM (Yearly) Last completed: May 04, 2019 Dec 11 2020 COVID-19 Vaccine (5 - Booster for Caitlin Ho series) Last completed: Oct 16, 2020   Upcoming    Feb 21 2021 COLONOSCOPY (Pts 45-16yrs Insurance coverage will need to be confirmed) (Every 10 Years) Postponed by Caitlin Brome, MD (Patient Declined) May 25 2021 FOOT EXAM (Yearly) Last completed: May 25, 2020 Jul 22 2021 HEMOGLOBIN A1C (Every 6 Months) Last completed: Jan 22, 2021 Nov 09 2021 OPHTHALMOLOGY EXAM Caitlin Ho) Last completed: Nov 09, 2020 Jan 13 2028 TETANUS/TDAP (Every 10 Years) Last completed: Jan 12, 2018    Star Rating Drugs: None  Cherlyn Labella Clinical Pharmacist Assistant [phone]

## 2021-02-01 ENCOUNTER — Other Ambulatory Visit: Payer: Self-pay | Admitting: Family Medicine

## 2021-02-21 DIAGNOSIS — M5416 Radiculopathy, lumbar region: Secondary | ICD-10-CM | POA: Diagnosis not present

## 2021-02-21 DIAGNOSIS — I10 Essential (primary) hypertension: Secondary | ICD-10-CM | POA: Diagnosis not present

## 2021-02-21 DIAGNOSIS — Z6834 Body mass index (BMI) 34.0-34.9, adult: Secondary | ICD-10-CM | POA: Diagnosis not present

## 2021-02-21 DIAGNOSIS — M1611 Unilateral primary osteoarthritis, right hip: Secondary | ICD-10-CM | POA: Diagnosis not present

## 2021-02-28 ENCOUNTER — Other Ambulatory Visit: Payer: Self-pay | Admitting: Neurological Surgery

## 2021-02-28 DIAGNOSIS — M1611 Unilateral primary osteoarthritis, right hip: Secondary | ICD-10-CM

## 2021-03-15 ENCOUNTER — Other Ambulatory Visit: Payer: Self-pay | Admitting: Legal Medicine

## 2021-03-21 ENCOUNTER — Other Ambulatory Visit: Payer: Medicare HMO

## 2021-03-22 ENCOUNTER — Ambulatory Visit
Admission: RE | Admit: 2021-03-22 | Discharge: 2021-03-22 | Disposition: A | Discharge status: Home / Self Care | Payer: Medicare HMO | Source: Ambulatory Visit | Attending: Neurological Surgery | Admitting: Neurological Surgery

## 2021-03-22 ENCOUNTER — Other Ambulatory Visit: Payer: Self-pay

## 2021-03-22 DIAGNOSIS — M1611 Unilateral primary osteoarthritis, right hip: Secondary | ICD-10-CM

## 2021-03-22 DIAGNOSIS — M25551 Pain in right hip: Secondary | ICD-10-CM | POA: Diagnosis not present

## 2021-03-22 MED ORDER — IOPAMIDOL (ISOVUE-M 200) INJECTION 41%
10.0000 mL | Freq: Once | INTRAMUSCULAR | Status: AC
  Administered 2021-03-22: 10 mL via INTRA_ARTICULAR

## 2021-04-05 DIAGNOSIS — M1611 Unilateral primary osteoarthritis, right hip: Secondary | ICD-10-CM | POA: Diagnosis not present

## 2021-04-07 ENCOUNTER — Other Ambulatory Visit: Payer: Self-pay | Admitting: Physician Assistant

## 2021-04-07 ENCOUNTER — Other Ambulatory Visit: Payer: Self-pay | Admitting: Family Medicine

## 2021-04-07 DIAGNOSIS — E038 Other specified hypothyroidism: Secondary | ICD-10-CM

## 2021-04-10 ENCOUNTER — Other Ambulatory Visit: Payer: Self-pay | Admitting: Family Medicine

## 2021-04-16 DIAGNOSIS — M1611 Unilateral primary osteoarthritis, right hip: Secondary | ICD-10-CM | POA: Diagnosis not present

## 2021-04-26 ENCOUNTER — Ambulatory Visit (INDEPENDENT_AMBULATORY_CARE_PROVIDER_SITE_OTHER): Payer: Medicare HMO | Admitting: Family Medicine

## 2021-04-26 ENCOUNTER — Other Ambulatory Visit: Payer: Self-pay

## 2021-04-26 VITALS — BP 120/76 | HR 76 | Temp 97.0°F | Resp 16 | Ht 64.0 in | Wt 204.0 lb

## 2021-04-26 DIAGNOSIS — M545 Low back pain, unspecified: Secondary | ICD-10-CM

## 2021-04-26 DIAGNOSIS — G8929 Other chronic pain: Secondary | ICD-10-CM

## 2021-04-26 DIAGNOSIS — E038 Other specified hypothyroidism: Secondary | ICD-10-CM

## 2021-04-26 DIAGNOSIS — R7303 Prediabetes: Secondary | ICD-10-CM

## 2021-04-26 DIAGNOSIS — I10 Essential (primary) hypertension: Secondary | ICD-10-CM

## 2021-04-26 DIAGNOSIS — H53469 Homonymous bilateral field defects, unspecified side: Secondary | ICD-10-CM

## 2021-04-26 DIAGNOSIS — M797 Fibromyalgia: Secondary | ICD-10-CM

## 2021-04-26 DIAGNOSIS — I69398 Other sequelae of cerebral infarction: Secondary | ICD-10-CM

## 2021-04-26 DIAGNOSIS — E782 Mixed hyperlipidemia: Secondary | ICD-10-CM | POA: Diagnosis not present

## 2021-04-26 NOTE — Progress Notes (Signed)
Subjective:  Patient ID: Caitlin Ho, female    DOB: Jul 10, 1955  Age: 66 y.o. MRN: [id number]  Chief Complaint  Patient presents with   Hypertension   Prediabetes   Hyperlipidemia   Hypothyroidism    HPI Hypertension:  Metoprolol tartrate 25 mg daily. History of stroke - on aspirin 81 mg daily.  Hyperlipidemia:  Fenofibrate 160 mg daily.  Eating healthy. Unable to exercise due to pain.  Hypothyroidism:  Levothyroxine 125 mcg daily.   GERD:  Omeprazole 40 mg daily.   Right hip OA: Saw Dr. Percell Miller. Referred by Dr. Ellene Route. Had right hip injection under fluoro. Still hurting. Did not work. Takes the tramadol.  Current Outpatient Medications on File Prior to Visit  Medication Sig Dispense Refill   aspirin EC 81 MG tablet Take 1 tablet (81 mg total) by mouth daily. 30 tablet 11   cetirizine (ZYRTEC) 10 MG tablet Take 10 mg by mouth daily as needed for allergies.     fenofibrate 160 MG tablet TAKE 1 TABLET EVERY DAY 90 tablet 1   furosemide (LASIX) 40 MG tablet TAKE 1 TABLET EVERY DAY (Patient taking differently: Take 40 mg by mouth daily as needed.) 90 tablet 1   gabapentin (NEURONTIN) 600 MG tablet Take 1 tablet (600 mg total) by mouth 3 (three) times daily. 270 tablet 3   glucosamine-chondroitin 500-400 MG tablet Take 1 tablet by mouth 3 (three) times daily.     levothyroxine (SYNTHROID) 125 MCG tablet TAKE 1 TABLET EVERY DAY 90 tablet 0   methocarbamol (ROBAXIN) 500 MG tablet TAKE 1 TABLET THREE TIMES DAILY 270 tablet 1   metoprolol tartrate (LOPRESSOR) 25 MG tablet TAKE 1 TABLET TWICE DAILY 180 tablet 1   omeprazole (PRILOSEC) 40 MG capsule TAKE 1 CAPSULE EVERY DAY 90 capsule 0   polyethylene glycol (MIRALAX / GLYCOLAX) 17 g packet Take 17 g by mouth daily as needed for moderate constipation.     traMADol (ULTRAM) 50 MG tablet TAKE 2 TABLETS (100 MG TOTAL) BY MOUTH IN THE MORNING, AT NOON, AND AT BEDTIME. 180 tablet 0   No current facility-administered medications on file prior to  visit.   Past Medical History:  Diagnosis Date   Cancer ([place])    cervical cancer cells whwn patient was 22    Cervicalgia    Fibromyalgia    GERD (gastroesophageal reflux disease)    not on medication   Hashimoto's disease    Headache(784.0)    sinus   Homonymous hemianopia    Hyperlipidemia    Hypertension    Hypothyroid    Other insomnia    Other sequelae of cerebral infarction    Palpitations    Pneumonia    PONV (postoperative nausea and vomiting)    06/20/14 patient reported that BP dropped real low and she had N/V  and was slow to awaken when she had surgery at an outpatient center.   Pre-diabetes    Prediabetes    Seasonal allergies    Sleep apnea    no CPAP   Stroke ([facility])    Supraventricular tachycardia ([facility])    Vitamin D deficiency    Past Surgical History:  Procedure Laterality Date   ABDOMINAL HYSTERECTOMY     APPENDECTOMY  1978   BACK SURGERY     CERVICAL FUSION  2017   2 levels   DILATION AND CURETTAGE OF UTERUS     LUMBAR DISC SURGERY  10/03/2012   Dr Ellene Route  2 Discectomy   LUMBAR  [place] SURGERY  07/2012   LUMBAR FUSION  2015   Fusion (2 levels)   LUMBAR FUSION  2016   fusion 1 level.   LUMBAR FUSION  02/16/2013   Fusion lumbar.   THYROIDECTOMY  2006   THYROIDECTOMY  2006   TUBAL LIGATION      Family History  Problem Relation Age of Onset   Aneurysm Mother        brain   Heart attack Father 44   Cancer Paternal Grandmother        Breast   Hyperlipidemia Other    Hypertension Other    Diabetes Other    Social History   Socioeconomic History   Marital status: Married    Spouse name: Not on file   Number of children: 1   Years of education: Not on file   Highest education level: Not on file  Occupational History   Occupation: Disabled  Tobacco Use   Smoking status: Never   Smokeless tobacco: Never  Vaping Use   Vaping Use: Never used  Substance and Sexual Activity   Alcohol use: No   Drug use: No   Sexual activity: Yes     Partners: Male    Birth control/protection: None  Other Topics Concern   Not on file  Social History Narrative   Not on file   Social Determinants of Health   Financial Resource Strain: Not on file  Food Insecurity: Not on file  Transportation Needs: Not on file  Physical Activity: Not on file  Stress: Not on file  Social Connections: Not on file    Review of Systems  Constitutional:  Positive for fatigue. Negative for chills and fever.  HENT:  Negative for congestion, rhinorrhea and sore throat.   Respiratory:  Negative for cough and shortness of breath.   Cardiovascular:  Negative for chest pain.  Gastrointestinal:  Negative for abdominal pain, constipation, diarrhea, nausea and vomiting.  Endocrine: Negative for polydipsia and polyphagia.  Genitourinary:  Negative for dysuria and urgency.  Musculoskeletal:  Positive for arthralgias (right hip pain). Negative for back pain and myalgias.  Neurological:  Negative for dizziness, weakness, light-headedness and headaches.  Psychiatric/Behavioral:  Negative for dysphoric mood. The patient is not nervous/anxious.     Objective:  BP 120/76    Pulse 76    Temp (!) 97 F (36.1 C)    Resp 16    Ht 5\' 4"  (1.626 m)    Wt 204 lb (92.5 kg)    LMP  (LMP Unknown)    BMI 35.02 kg/m   BP/Weight 04/26/2021 01/22/2021 1/57/2620  Systolic BP 355 974 163  Diastolic BP 76 74 76  Wt. (Lbs) 204 208 213  BMI 35.02 34.61 35.45    Physical Exam Vitals reviewed.  Constitutional:      Appearance: Normal appearance. She is normal weight.  Neck:     Vascular: No carotid bruit.  Cardiovascular:     Rate and Rhythm: Normal rate and regular rhythm.     Heart sounds: Normal heart sounds.  Pulmonary:     Effort: Pulmonary effort is normal. No respiratory distress.     Breath sounds: Normal breath sounds.  Abdominal:     General: Abdomen is flat. Bowel sounds are normal.     Palpations: Abdomen is soft.     Tenderness: There is no abdominal  tenderness.  Neurological:     Mental Status: She is alert and oriented to person, place, and time.  Psychiatric:  Mood and Affect: Mood normal.        Behavior: Behavior normal.    Diabetic Foot Exam - Simple   No data filed      Lab Results  Component Value Date   WBC 10.8 04/26/2021   HGB 14.4 04/26/2021   HCT 43.6 04/26/2021   PLT 398 04/26/2021   GLUCOSE 99 04/26/2021   CHOL 160 04/26/2021   TRIG 113 04/26/2021   HDL 50 04/26/2021   LDLCALC 90 04/26/2021   ALT 12 04/26/2021   AST 15 04/26/2021   NA 140 04/26/2021   K 5.5 (H) 04/26/2021   CL 104 04/26/2021   CREATININE 0.98 04/26/2021   BUN 21 04/26/2021   CO2 24 04/26/2021   TSH 7.300 (H) 04/26/2021   HGBA1C 6.1 (H) 04/26/2021      Assessment & Plan:   Problem List Items Addressed This Visit       Cardiovascular and Mediastinum   Essential hypertension, benign    Well controlled.  No changes to medicines. Currently taking Metoprolol 25 mg daily. Continue to work on eating a healthy diet and exercise.  Labs drawn today.       Relevant Orders   CBC with Differential/Platelet (Completed)   Comprehensive metabolic panel (Completed)     Endocrine   Secondary hypothyroidism - Primary    The current medical regimen with levothyroxine 125 mcg daily is effective; awaiting for labs results to change medication if it necessary.      Relevant Orders   TSH (Completed)     Other   Class 2 severe obesity with body mass index (BMI) of 35 to 39.9 with serious comorbidity ([place])    Recommend continue to work on eating healthy diet and exercise.       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       Chronic low back pain    The current medical regimen is effective;  continue present plan and medications.       Mixed hyperlipidemia    Well controlled.  No changes to medicines. Continue with Fenofibrate 160 mg daily. Continue to work on eating a healthy diet  and exercise.  Labs drawn today.       Relevant Orders   Lipid panel (Completed)   Prediabetes    Hemoglobin A1c is in prediabetic range.  In order to prevent progression to diabetes, recommend low carb diet and regular exercise.      Relevant Orders   Hemoglobin A1c (Completed)   Fibromyalgia    The current medical regimen is effective;  continue present plan and medications.     .  No orders of the defined types were placed in this encounter.   Orders Placed This Encounter  Procedures   CBC with Differential/Platelet   Comprehensive metabolic panel   Hemoglobin A1c   Lipid panel   TSH   Cardiovascular Risk Assessment    Follow-up: Return in about 3 months (around 07/24/2021) for chronic fasting.  An After Visit Summary was printed and given to the patient.  Rochel Brome, MD [facility] [phone]

## 2021-04-27 LAB — COMPREHENSIVE METABOLIC PANEL
ALT: 12 IU/L (ref 0–32)
AST: 15 IU/L (ref 0–40)
Albumin/Globulin Ratio: 1.8 (ref 1.2–2.2)
Albumin: 4.3 g/dL (ref 3.8–4.8)
Alkaline Phosphatase: 78 IU/L (ref 44–121)
BUN/Creatinine Ratio: 21 (ref 12–28)
BUN: 21 mg/dL (ref 8–27)
Bilirubin Total: 0.2 mg/dL (ref 0.0–1.2)
CO2: 24 mmol/L (ref 20–29)
Calcium: 9.5 mg/dL (ref 8.7–10.3)
Chloride: 104 mmol/L (ref 96–106)
Creatinine, Ser: 0.98 mg/dL (ref 0.57–1.00)
Globulin, Total: 2.4 g/dL (ref 1.5–4.5)
Glucose: 99 mg/dL (ref 70–99)
Potassium: 5.5 mmol/L — ABNORMAL HIGH (ref 3.5–5.2)
Sodium: 140 mmol/L (ref 134–144)
Total Protein: 6.7 g/dL (ref 6.0–8.5)
eGFR: 65 mL/min/{1.73_m2} (ref >59)

## 2021-04-27 LAB — CBC WITH DIFFERENTIAL/PLATELET
Basophils Absolute: 0.2 10*3/uL (ref 0.0–0.2)
Basos: 1 %
EOS (ABSOLUTE): 0.4 10*3/uL (ref 0.0–0.4)
Eos: 3 %
Hematocrit: 43.6 % (ref 34.0–46.6)
Hemoglobin: 14.4 g/dL (ref 11.1–15.9)
Immature Grans (Abs): 0.1 10*3/uL (ref 0.0–0.1)
Immature Granulocytes: 1 %
Lymphocytes Absolute: 4.4 10*3/uL — ABNORMAL HIGH (ref 0.7–3.1)
Lymphs: 41 %
MCH: 29.2 pg (ref 26.6–33.0)
MCHC: 33 g/dL (ref 31.5–35.7)
MCV: 88 fL (ref 79–97)
Monocytes Absolute: 0.9 10*3/uL (ref 0.1–0.9)
Monocytes: 8 %
Neutrophils Absolute: 5 10*3/uL (ref 1.4–7.0)
Neutrophils: 46 %
Platelets: 398 10*3/uL (ref 150–450)
RBC: 4.93 x10E6/uL (ref 3.77–5.28)
RDW: 12.3 % (ref 11.7–15.4)
WBC: 10.8 10*3/uL (ref 3.4–10.8)

## 2021-04-27 LAB — TSH: TSH: 7.3 u[IU]/mL — ABNORMAL HIGH (ref 0.450–4.500)

## 2021-04-27 LAB — LIPID PANEL
Chol/HDL Ratio: 3.2 ratio (ref 0.0–4.4)
Cholesterol, Total: 160 mg/dL (ref 100–199)
HDL: 50 mg/dL (ref >39)
LDL Chol Calc (NIH): 90 mg/dL (ref 0–99)
Triglycerides: 113 mg/dL (ref 0–149)
VLDL Cholesterol Cal: 20 mg/dL (ref 5–40)

## 2021-04-27 LAB — HEMOGLOBIN A1C
Est. average glucose Bld gHb Est-mCnc: 128 mg/dL
Hgb A1c MFr Bld: 6.1 % — ABNORMAL HIGH (ref 4.8–5.6)

## 2021-04-29 DIAGNOSIS — M797 Fibromyalgia: Secondary | ICD-10-CM | POA: Insufficient documentation

## 2021-04-29 NOTE — Assessment & Plan Note (Signed)
Well controlled.  No changes to medicines. Currently taking Metoprolol 25 mg daily. Continue to work on eating a healthy diet and exercise.  Labs drawn today.  

## 2021-04-29 NOTE — Assessment & Plan Note (Signed)
The current medical regimen is effective;  continue present plan and medications.  

## 2021-04-29 NOTE — Assessment & Plan Note (Addendum)
The current medical regimen with levothyroxine 125 mcg daily is effective; awaiting for labs results to change medication if it necessary.  

## 2021-04-29 NOTE — Assessment & Plan Note (Signed)
Hemoglobin A1c is in prediabetic range.  In order to prevent progression to diabetes, recommend low carb diet and regular exercise.

## 2021-04-29 NOTE — Assessment & Plan Note (Signed)
Well controlled.  No changes to medicines. Continue with Fenofibrate 160 mg daily. Continue to work on eating a healthy diet and exercise.  Labs drawn today.

## 2021-04-30 DIAGNOSIS — M1611 Unilateral primary osteoarthritis, right hip: Secondary | ICD-10-CM | POA: Diagnosis not present

## 2021-04-30 NOTE — Progress Notes (Signed)
Blood count normal.  Liver function normal.  Kidney function normal. Potassium still elevated. Unchanged. Recommend take lasix 20 mg daily (40 mg 1/2 daily)  Recheck potassium in 6 wks Thyroid function abnormal but improved. Increase levothyroxine to 137 mcg once daily in am. Recheck tsh in 6 weeks.  Cholesterol: improved.  HBA1C: 6.1. improved.

## 2021-05-01 ENCOUNTER — Encounter: Payer: Self-pay | Admitting: Family Medicine

## 2021-05-01 NOTE — Assessment & Plan Note (Signed)
Continue aspirin 81 mg daily.   

## 2021-05-01 NOTE — Assessment & Plan Note (Signed)
The current medical regimen is effective;  continue present plan and medications.  

## 2021-05-01 NOTE — Assessment & Plan Note (Signed)
Recommend continue to work on eating healthy diet and exercise.  

## 2021-05-02 ENCOUNTER — Other Ambulatory Visit: Payer: Self-pay

## 2021-05-02 MED ORDER — LEVOTHYROXINE SODIUM 137 MCG PO TABS: 137.0000 ug | ORAL_TABLET | Freq: Every day | ORAL | 1 refills | Status: DC

## 2021-05-03 ENCOUNTER — Telehealth: Payer: Self-pay

## 2021-05-03 NOTE — Telephone Encounter (Signed)
General/Other - neurologist ? ?Patient questioning if a neurologist was contacted about the meloxicam.  ? ?Caitlin Ho 05/03/21 3:13 PM ? ? ?

## 2021-05-04 ENCOUNTER — Other Ambulatory Visit: Payer: Self-pay

## 2021-05-04 MED ORDER — MELOXICAM 7.5 MG PO TABS: 7.5000 mg | ORAL_TABLET | Freq: Every day | ORAL | 0 refills | Status: DC

## 2021-05-04 NOTE — Telephone Encounter (Signed)
Patient was notified that Dr. Tobie Poet spoke with Neurology and it is ok for her to take the meloxicam.  She requested that the rx be sent to [facility] mail order.   ?

## 2021-05-10 ENCOUNTER — Ambulatory Visit (INDEPENDENT_AMBULATORY_CARE_PROVIDER_SITE_OTHER): Payer: Medicare HMO | Admitting: Nurse Practitioner

## 2021-05-10 ENCOUNTER — Other Ambulatory Visit: Payer: Self-pay | Admitting: Family Medicine

## 2021-05-10 ENCOUNTER — Other Ambulatory Visit: Payer: Self-pay

## 2021-05-10 ENCOUNTER — Encounter: Payer: Self-pay | Admitting: Nurse Practitioner

## 2021-05-10 VITALS — BP 112/66 | HR 66 | Temp 96.8°F | Ht 64.0 in | Wt 205.0 lb

## 2021-05-10 DIAGNOSIS — E782 Mixed hyperlipidemia: Secondary | ICD-10-CM

## 2021-05-10 DIAGNOSIS — E038 Other specified hypothyroidism: Secondary | ICD-10-CM

## 2021-05-10 DIAGNOSIS — R7303 Prediabetes: Secondary | ICD-10-CM

## 2021-05-10 DIAGNOSIS — K219 Gastro-esophageal reflux disease without esophagitis: Secondary | ICD-10-CM

## 2021-05-10 DIAGNOSIS — Z01818 Encounter for other preprocedural examination: Secondary | ICD-10-CM

## 2021-05-10 DIAGNOSIS — Z6835 Body mass index (BMI) 35.0-35.9, adult: Secondary | ICD-10-CM

## 2021-05-10 DIAGNOSIS — M1611 Unilateral primary osteoarthritis, right hip: Secondary | ICD-10-CM | POA: Diagnosis not present

## 2021-05-10 DIAGNOSIS — E875 Hyperkalemia: Secondary | ICD-10-CM

## 2021-05-10 DIAGNOSIS — Z87898 Personal history of other specified conditions: Secondary | ICD-10-CM | POA: Diagnosis not present

## 2021-05-10 DIAGNOSIS — Z8673 Personal history of transient ischemic attack (TIA), and cerebral infarction without residual deficits: Secondary | ICD-10-CM

## 2021-05-10 DIAGNOSIS — Z7982 Long term (current) use of aspirin: Secondary | ICD-10-CM

## 2021-05-10 DIAGNOSIS — I10 Essential (primary) hypertension: Secondary | ICD-10-CM

## 2021-05-10 DIAGNOSIS — G4730 Sleep apnea, unspecified: Secondary | ICD-10-CM | POA: Diagnosis not present

## 2021-05-10 NOTE — Patient Instructions (Addendum)
Stop Aspirin 5-days prior to surgery ?Obtain chest x-ray at [facility] ?Recommend dental exam ? ? ?Safety Before and After Surgery ?Your health care providers, including your surgical team, take many precautions to keep you safe during surgery. The following information explains the steps that your health care providers take to prevent surgical error and to reduce the risk of complications. It also lists some things that you, your friends, and your family members can do to ensure a safe surgery. ?What is surgical error? ?Surgical error is when a mistake is made during a procedure. Types of surgical error include: ?Operating on the wrong person. ?Operating on the wrong body part. ?Performing the wrong operation. ?Making a medicine mistake. ?Making a surgical mistake. ?What is a complication? ?A complication is a change in the normal surgical plan of care. Complications are known risks that, while rare, can occur during or after surgery. Examples of complications include: ?Infection. ?Excess bleeding. ?Inhaling food or liquids from your stomach into your lungs (aspiration). ?Allergic reaction to medicines. ?Review all risks and complications with your health care provider as part of the informed consent procedure before your surgery. Ask questions if you do not understand something. ?What steps are taken to prevent surgical error and complications? ?Your health care providers, including your surgical team, take many steps before and during a procedure to reduce the risk of error and complication. These steps include: ?Placing an identification bracelet on your wrist and checking it often. ?Checking the surgical consent form to make sure it is correct. ?Reviewing the surgical consent form with you to make sure that you understand the procedure. ?Reviewing your medicines and history of allergies to prevent drug reactions and interactions. ?Marking the surgery site before the procedure. ?Washing and disinfecting  the surgery site before the procedure to prevent infection. ?Giving you a list of safety steps to take before surgery. This usually includes what foods or medicines to avoid and any lifestyle changes you should make before the procedure, such as quitting smoking. ?How can I help to promote a safe surgery? ?One way you can help to prevent surgical error is by choosing a surgeon and facility that is right for you. To do this: ?Ask how many times your surgeon has performed the surgery you will be having. ?Ask how often the surgery is done in the hospital where you will have the procedure. ?Choose a hospital that is licensed, accredited, and has a system in place in case of an emergency. ?Here are some other steps you can take: ?Talk to your anesthesia provider about your medicines, allergies, any drug use, and any previous drug or anesthesia reactions you have had. Be honest. ?Review the surgical consent form. Make sure you understand it completely. ?Make sure that the correct area of your body is marked as the surgery site. Your surgeon will mark the surgery site before the surgery. ?Do not eat any food or drink before surgery if your surgeon has instructed you not to. Follow all instructions about eating and drinking. ?Keep the surgical area clean. Follow instructions if you are asked to shower with an antibacterial soap. ?Follow all of your home care instructions. Wash your hands often with soap and water for at least 20 seconds, and especially before bandage (dressing) changes. ?How can my friends or family members promote a safe surgery? ?Friends and family can help by: ?Going with you to your medical appointments before your surgery to make sure you understand the procedure. ?Reviewing the safety steps that  you need to take before surgery and making sure that you follow them. ?Knowing what surgery you are scheduled to have. ?Going with you to the procedure. ?Double-checking that the correct body part is marked  before surgery. ?Advocating for you on your behalf if you cannot do so. ?Following the home care instructions for after the surgery to help you heal well. ?Questions to ask before surgery ?Why do I need surgery? Is another treatment available? ?What are the risks and benefits of the surgery? ?Do I have a choice of anesthesia? ?What should I expect after surgery? Can I eat and drink after surgery? How much pain will I have? When can I go back to work? ?What is your experience with this type of surgery? Is the surgery center accredited? ?Do you take my insurance? How much will the surgery cost? ?Where to find more information ?[employer], "Preparing for Your Operation and Recovery": www.facs.org ?Centers for Disease Control and Prevention, "What You Should Know Before Your Surgery": http://www.wolf.info/ ?Summary ?Your surgical team takes specific precautions to prevent surgical errors and complications. ?You can take steps to promote a safe surgery. ?Ask questions before your procedure, and make sure that you understand what will happen. ?Make sure that you understand your instructions for home care. ?This information is not intended to replace advice given to you by your health care provider. Make sure you discuss any questions you have with your health care provider. ?Document Revised: 05/01/2020 Document Reviewed: 05/01/2020 ?Elsevier Patient Education ? [place]. ? ?

## 2021-05-10 NOTE — Progress Notes (Signed)
Subjective:  Patient ID: Caitlin Ho, female    DOB: November 27, 1955  Age: 66 y.o. MRN: [id number]     Chief Complaint  Patient presents with   66 Surgical Clearance   Hip Pain    Right   HPI Caitlin Ho is a 66 year old Caucasian female that presents for pre-operative evaluation. She has a past history of post-op nausea and vomiting. She has sleep apnea, not currently using a CPAP machine. Denies recent dental exam. She has a past medical history of GERD,hypothyroidism, hypertension, hyperlipidemia, prediabetes, and previous CVA. Recent labs on 04/26/21 revealed mild hyperkalemia at 5.5. Currently treated with Lasix, scheduled for repeat CMP in two weeks.   Caitlin Ho  is here for a Pre-operative physical at the request of Dr. Edmonia Ho.   She  is having Right total hip replacement surgery on TBD.  Personal or family hx of adverse outcome to anesthesia? No  Chipped, cracked, missing, or loose teeth? No  Decreased ROM of neck? No  Able to walk up 2 flights of stairs without becoming significantly short of breath or having chest pain? Yes    GERD  The patient was last seen for GERD 3 weeks ago. Current treatment includes Prilosec 40 mg daily and avoiding foods that trigger GERD.     She reports excellent compliance with treatment. She is not having side effects.  She is NOT experiencing belching and eructation    Hypertension She was last seen for hypertension 3 weeks ago.  BP at that visit was 120/76. Management includes Metoprolol 25 mg BID.     She reports excellent compliance with treatment. She is not having side effects.  She is following a Regular diet. She is not exercising. She does not smoke.     Use of agents associated with hypertension: thyroid hormones.   Outside blood pressures are not being checked. Symptoms: No chest pain No chest pressure  No palpitations No syncope  No dyspnea No orthopnea  No paroxysmal nocturnal dyspnea No lower extremity edema   Pertinent  labs: Lab Results  Component Value Date   CHOL 160 04/26/2021   HDL 50 04/26/2021   LDLCALC 90 04/26/2021   TRIG 113 04/26/2021   CHOLHDL 3.2 04/26/2021   Lab Results  Component Value Date   NA 140 04/26/2021   K 5.5 (H) 04/26/2021   CREATININE 0.98 04/26/2021   EGFR 65 04/26/2021   GFRNONAA >60 06/30/2020   GLUCOSE 99 04/26/2021      Lipid/Cholesterol, Follow-up  Last lipid panel Other pertinent labs  Lab Results  Component Value Date   CHOL 160 04/26/2021   HDL 50 04/26/2021   LDLCALC 90 04/26/2021   TRIG 113 04/26/2021   CHOLHDL 3.2 04/26/2021   Lab Results  Component Value Date   ALT 12 04/26/2021   AST 15 04/26/2021   PLT 398 04/26/2021   TSH 7.300 (H) 04/26/2021     She was last seen for this 3 weeks ago.  Management includes Fenofibrate 160 mg. She reports excellent compliance with treatment. She is not having side effects.   Prediabetes, Follow-up  Lab Results  Component Value Date   HGBA1C 6.1 (H) 04/26/2021   HGBA1C 6.2 (H) 01/22/2021   HGBA1C 6.3 (H) 10/16/2020   GLUCOSE 99 04/26/2021   GLUCOSE 116 (H) 01/22/2021   GLUCOSE 119 (H) 10/16/2020    Last seen for for this3 weeks ago.  Management since that visit includes diet controlled. Current symptoms include none and have been stable.  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   Prediabetes 08/06/2019   Chronic neck pain with history of cervical spinal surgery 06/29/2019    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/98/64   Stroke ([place]) 06/29/62   Class 2 severe obesity with body mass index (BMI) of 35 to 39.9 with serious comorbidity ([place]) 05/24/2019   Essential hypertension, benign 05/05/2019   Mixed hyperlipidemia 05/05/2019   Secondary hypothyroidism 05/05/2019   Impaired glucose metabolism 05/05/2019   Comorbid sleep-related hypoventilation 10/25/2018   Occipital cerebral infarction ([place]) 01/01/2018   Lumbar radiculopathy 07/19/2013   Lumbosacral radiculitis 06/10/2013   Chronic low back pain 03/11/2013   Lumbar stenosis 02/16/2013   Past Medical History:  Diagnosis Date   Cancer ([place])    cervical cancer cells whwn patient was 66    Cervicalgia    Fibromyalgia    GERD (gastroesophageal reflux disease)    not on medication   Hashimoto's disease    Headache(784.0)    sinus   Homonymous hemianopia    Hyperlipidemia    Hypertension    Hypothyroid    Other insomnia    Other sequelae of cerebral infarction    Palpitations    Pneumonia    PONV (postoperative nausea and vomiting)    06/20/14 patient reported that BP dropped real low and she had N/V  and was slow to awaken when she had surgery at an outpatient center.   Pre-diabetes    Prediabetes    Seasonal allergies    Sleep apnea    no CPAP   Stroke ([place])    Supraventricular tachycardia ([place])    Vitamin D deficiency     Past Surgical History:  Procedure Laterality Date   ABDOMINAL HYSTERECTOMY     APPENDECTOMY  1978   BACK SURGERY     CERVICAL FUSION  2017   2 levels   DILATION AND CURETTAGE OF UTERUS     LUMBAR DISC SURGERY  10/03/2012   Dr Caitlin Ho  2 Discectomy   LUMBAR [place] SURGERY  07/2012   LUMBAR FUSION  2015   Fusion (2 levels)   LUMBAR FUSION  2016   fusion 1 level.   LUMBAR FUSION  02/16/2013   Fusion lumbar.   THYROIDECTOMY  2006   THYROIDECTOMY  2006   TUBAL LIGATION      Current Outpatient Medications  Medication Sig Dispense Refill   aspirin EC  81 MG tablet Take 1 tablet (81 mg total) by mouth daily. 30 tablet 11   cetirizine (ZYRTEC) 10 MG tablet Take 10 mg by mouth daily as needed for allergies.     fenofibrate 160 MG tablet TAKE 1 TABLET EVERY DAY 90 tablet 1   furosemide (LASIX) 40 MG tablet TAKE 1 TABLET EVERY DAY (Patient taking differently: Take 40 mg by mouth daily as needed.) 90 tablet 1   gabapentin (NEURONTIN) 600 MG tablet Take 1 tablet (600 mg total) by  mouth 3 (three) times daily. 270 tablet 3   glucosamine-chondroitin 500-400 MG tablet Take 1 tablet by mouth 3 (three) times daily.     levothyroxine (SYNTHROID) 137 MCG tablet Take 1 tablet (137 mcg total) by mouth daily before breakfast. 30 tablet 1   meloxicam (MOBIC) 7.5 MG tablet Take 1 tablet (7.5 mg total) by mouth daily. 90 tablet 0   methocarbamol (ROBAXIN) 500 MG tablet TAKE 1 TABLET THREE TIMES DAILY 270 tablet 1   metoprolol tartrate (LOPRESSOR) 25 MG tablet TAKE 1 TABLET TWICE DAILY 180 tablet 1   omeprazole (PRILOSEC) 40 MG capsule TAKE 1 CAPSULE EVERY DAY 90 capsule 0   polyethylene glycol (MIRALAX / GLYCOLAX) 17 g packet Take 17 g by mouth daily as needed for moderate constipation.     traMADol (ULTRAM) 50 MG tablet TAKE 2 TABLETS (100 MG TOTAL) BY MOUTH IN THE MORNING, AT NOON, AND AT BEDTIME. 180 tablet 0   No current facility-administered medications for this visit.    Allergies  Allergen Reactions   Topamax [Topiramate] Other (See Comments)    Fatigue, constipation, gum tenderness, breast tenderness   Lipitor [Atorvastatin]     Myalgia   Pravastatin     Joint pain    Social History   Socioeconomic History   Marital status: Married    Spouse name: Not on file   Number of children: 1   Years of education: Not on file   Highest education level: Not on file  Occupational History   Occupation: Disabled  Tobacco Use   Smoking status: Never   Smokeless tobacco: Never  Vaping Use   Vaping Use: Never used  Substance and Sexual Activity    Alcohol use: No   Drug use: No   Sexual activity: Yes    Partners: Male    Birth control/protection: None  Other Topics Concern   Not on file  Social History Narrative   Not on file   Social Determinants of Health   Financial Resource Strain: Not on file  Food Insecurity: Not on file  Transportation Needs: Not on file  Physical Activity: Not on file  Stress: Not on file  Social Connections: Not on file  Intimate Partner Violence: Not on file    Family History  Problem Relation Age of Onset   Aneurysm Mother        brain   Heart attack Father 64   Cancer Paternal Grandmother        Breast   Hyperlipidemia Other    Hypertension Other    Diabetes Other      Current Outpatient Medications on File Prior to Visit  Medication Sig Dispense Refill   aspirin EC 81 MG tablet Take 1 tablet (81 mg total) by mouth daily. 30 tablet 11   cetirizine (ZYRTEC) 10 MG tablet Take 10 mg by mouth daily as needed for allergies.     fenofibrate 160 MG tablet TAKE 1 TABLET EVERY DAY 90 tablet 1   furosemide (LASIX) 40 MG tablet TAKE 1 TABLET EVERY DAY (Patient taking differently: Take 40 mg by mouth daily as needed.) 90 tablet 1   gabapentin (NEURONTIN) 600 MG tablet Take 1 tablet (600 mg total) by mouth 3 (three) times daily. 270 tablet 3   glucosamine-chondroitin 500-400 MG tablet Take 1 tablet by mouth 3 (three) times daily.     levothyroxine (SYNTHROID) 137 MCG tablet Take 1 tablet (137 mcg total) by mouth daily before breakfast. 30 tablet 1   meloxicam (MOBIC)  7.5 MG tablet Take 1 tablet (7.5 mg total) by mouth daily. 90 tablet 0   methocarbamol (ROBAXIN) 500 MG tablet TAKE 1 TABLET THREE TIMES DAILY 270 tablet 1   metoprolol tartrate (LOPRESSOR) 25 MG tablet TAKE 1 TABLET TWICE DAILY 180 tablet 1   omeprazole (PRILOSEC) 40 MG capsule TAKE 1 CAPSULE EVERY DAY 90 capsule 0   polyethylene glycol (MIRALAX / GLYCOLAX) 17 g packet Take 17 g by mouth daily as needed for moderate constipation.      traMADol (ULTRAM) 50 MG tablet TAKE 2 TABLETS (100 MG TOTAL) BY MOUTH IN THE MORNING, AT NOON, AND AT BEDTIME. 180 tablet 0   No current facility-administered medications on file prior to visit.   Past Medical History:  Diagnosis Date   Cancer ([place])    cervical cancer cells whwn patient was 66    Cervicalgia    Fibromyalgia    GERD (gastroesophageal reflux disease)    not on medication   Hashimoto's disease    Headache(784.0)    sinus   Homonymous hemianopia    Hyperlipidemia    Hypertension    Hypothyroid    Other insomnia    Other sequelae of cerebral infarction    Palpitations    Pneumonia    PONV (postoperative nausea and vomiting)    06/20/14 patient reported that BP dropped real low and she had N/V  and was slow to awaken when she had surgery at an outpatient center.   Pre-diabetes    Prediabetes    Seasonal allergies    Sleep apnea    no CPAP   Stroke ([place])    Supraventricular tachycardia ([place])    Vitamin D deficiency    Past Surgical History:  Procedure Laterality Date   ABDOMINAL HYSTERECTOMY     APPENDECTOMY  1978   BACK SURGERY     CERVICAL FUSION  2017   2 levels   DILATION AND CURETTAGE OF UTERUS     LUMBAR DISC SURGERY  10/03/2012   Dr Caitlin Ho  2 Discectomy   LUMBAR [place] SURGERY  07/2012   LUMBAR FUSION  2015   Fusion (2 levels)   LUMBAR FUSION  2016   fusion 1 level.   LUMBAR FUSION  02/16/2013   Fusion lumbar.   THYROIDECTOMY  2006   THYROIDECTOMY  2006   TUBAL LIGATION      Family History  Problem Relation Age of Onset   Aneurysm Mother        brain   Heart attack Father 43   Cancer Paternal Grandmother        Breast   Hyperlipidemia Other    Hypertension Other    Diabetes Other    Social History   Socioeconomic History   Marital status: Married    Spouse name: Not on file   Number of children: 1   Years of education: Not on file   Highest education level: Not on file  Occupational History   Occupation: Disabled  Tobacco Use    Smoking status: Never   Smokeless tobacco: Never  Vaping Use   Vaping Use: Never used  Substance and Sexual Activity   Alcohol use: No   Drug use: No   Sexual activity: Yes    Partners: Male    Birth control/protection: None  Other Topics Concern   Not on file  Social History Narrative   Not on file   Social Determinants of Health   Financial Resource Strain: Not on file  Food  Insecurity: Not on file  Transportation Needs: Not on file  Physical Activity: Not on file  Stress: Not on file  Social Connections: Not on file    Review of Systems  Constitutional:  Negative for chills, fatigue and fever.  HENT:  Negative for congestion, ear pain, rhinorrhea and sore throat.   Respiratory:  Negative for cough and shortness of breath.   Cardiovascular:  Negative for chest pain.  Gastrointestinal:  Negative for abdominal pain, constipation, diarrhea, nausea and vomiting.  Genitourinary:  Negative for dysuria and urgency.  Musculoskeletal:  Positive for arthralgias (right hip), back pain and myalgias (right hip).  Allergic/Immunologic: Positive for environmental allergies.  Neurological:  Negative for dizziness, weakness, light-headedness and headaches.  Psychiatric/Behavioral:  Negative for dysphoric mood. The patient is not nervous/anxious.     Objective:  BP 112/66   Pulse 66   Temp (!) 96.8 F (36 C)   Ht '5\' 4"'  (1.626 m)   Wt 205 lb (93 kg)   LMP  (LMP Unknown)   SpO2 95%   BMI 35.19 kg/m    BP/Weight 05/10/2021 04/26/2021 92/33/0076  Systolic BP - 226 333  Diastolic BP - 76 74  Wt. (Lbs) 205 204 208  BMI 35.19 35.02 34.61    Physical Exam Vitals reviewed.  Constitutional:      Appearance: Normal appearance.  HENT:     Head: Normocephalic.     Right Ear: Tympanic membrane normal.     Left Ear: Tympanic membrane normal.     Nose: Nose normal.     Mouth/Throat:     Mouth: Mucous membranes are moist.  Eyes:     Pupils: Pupils are equal, round, and reactive to  light.  Cardiovascular:     Rate and Rhythm: Normal rate and regular rhythm.     Pulses: Normal pulses.     Heart sounds: Normal heart sounds.  Pulmonary:     Effort: Pulmonary effort is normal.     Breath sounds: Normal breath sounds.  Abdominal:     General: Bowel sounds are normal.     Palpations: Abdomen is soft.  Musculoskeletal:        General: Tenderness (right hip) present.     Cervical back: Neck supple.  Skin:    General: Skin is warm and dry.     Capillary Refill: Capillary refill takes less than 2 seconds.  Neurological:     General: No focal deficit present.     Mental Status: She is alert and oriented to person, place, and time.  Psychiatric:        Mood and Affect: Mood normal.        Behavior: Behavior normal.        Lab Results  Component Value Date   WBC 10.8 04/26/2021   HGB 14.4 04/26/2021   HCT 43.6 04/26/2021   PLT 398 04/26/2021   GLUCOSE 99 04/26/2021   CHOL 160 04/26/2021   TRIG 113 04/26/2021   HDL 50 04/26/2021   LDLCALC 90 04/26/2021   ALT 12 04/26/2021   AST 15 04/26/2021   NA 140 04/26/2021   K 5.5 (H) 04/26/2021   CL 104 04/26/2021   CREATININE 0.98 04/26/2021   BUN 21 04/26/2021   CO2 24 04/26/2021   TSH 7.300 (H) 04/26/2021   HGBA1C 6.1 (H) 04/26/2021      Assessment & Plan:   1. Pre-op exam - EKG 12-Lead - DG Chest 2 View  2. Primary osteoarthritis of right hip  3. Gastroesophageal reflux disease, unspecified whether esophagitis present-well controlled -continue Omeprazole 40 mg daily -avoid foods that trigger GERD  4. Mixed hyperlipidemia-well controlled -continue Fenofibrate 160 mg daily -heart healthy diet  5. Essential hypertension, benign-well controlled -continue Metoprolol 25 mg daily  6. Prediabetes-well controlled  7. Secondary hypothyroidism -continue Levothyroxine 137 mcg  -repeat TSH per PCP order  8. History of postoperative nausea and vomiting -preoperative prophylaxis per anesthesia  9.  Sleep apnea, unspecified type  10. Hyperkalemia -repeat CMP per PCP order  11. Long-term use of aspirin therapy -hold Aspirin 81 mg 5 days prior to surgery  12. BMI 35.0-35.9,adult  13. History of cerebrovascular accident (CVA) in adulthood    Stop Aspirin 5-days prior to surgery Obtain chest x-ray at [facility] dental exam       Follow-up: PRN  An After Visit Summary was printed and given to the patient.  I, Rip Harbour, NP, have reviewed all documentation for this visit. The documentation on 05/10/21 for the exam, diagnosis, procedures, and orders are all accurate and complete.    Signed, Rip Harbour, NP [place] [phone]

## 2021-05-15 DIAGNOSIS — Z01818 Encounter for other preprocedural examination: Secondary | ICD-10-CM | POA: Diagnosis not present

## 2021-05-20 ENCOUNTER — Other Ambulatory Visit: Payer: Self-pay | Admitting: Physician Assistant

## 2021-05-20 ENCOUNTER — Other Ambulatory Visit: Payer: Self-pay | Admitting: Legal Medicine

## 2021-05-25 ENCOUNTER — Other Ambulatory Visit: Payer: Self-pay | Admitting: Family Medicine

## 2021-05-28 NOTE — Telephone Encounter (Signed)
Refill sent to pharmacy.   

## 2021-05-29 ENCOUNTER — Telehealth: Payer: Self-pay

## 2021-05-29 NOTE — Telephone Encounter (Signed)
Patient called stating Ortho office has not received her surgical clearance papers. All documentation needed for clearance was faxed 3/21 at 4:29 p.m. and again today at 9:00 a.m via fax as well as through epic. Patient is aware and documentation is available for her to pick up and deliver herself if desired. ?

## 2021-05-30 ENCOUNTER — Other Ambulatory Visit: Payer: Self-pay

## 2021-05-30 DIAGNOSIS — Z1231 Encounter for screening mammogram for malignant neoplasm of breast: Secondary | ICD-10-CM

## 2021-05-30 NOTE — Progress Notes (Signed)
Patient called requesting order for screening mammogram. Requests to be scheduled at [facility] before 5/2 due to surgery. Last mammogram March of 2021.  ? ?Order placed.  ? ?Harrell Lark 05/30/21 2:03 PM ? ?

## 2021-05-31 ENCOUNTER — Telehealth: Payer: Self-pay

## 2021-05-31 NOTE — Chronic Care Management (AMB) (Signed)
? ? ?  Chronic Care Management ?Pharmacy Assistant  ? ?Name: Caitlin Ho  MRN: [id number] DOB: [birth year] ? ?Reason for Encounter: Disease State/ General ? ?Recent office visits:  ?05-10-2021 Rip Harbour, NP. Pre-op exam visit. Stop Aspirin 5-days prior to surgery. Chest xray ordered. ? ?04-26-2021 Rochel Brome, MD. Lymphocytes absolute= 4.4. Potassium= 5.5. A1C= 6.1. TSH= 7.300. ? ?01-22-2021 Marge Duncans, PA-C.  Lymphocytes absolute= 3.7. Glucose= 116, Potassium= 5.5. A1C= 6.2. LDL= 104. TSH= 10.400. ? ?Recent consult visits:  ?02-21-2021 Kristeen Miss, MD (Neurosurgery). Unable to view encounter. ? ?Hospital visits:  ?None in previous 6 months ? ?Medications: ?Outpatient Encounter Medications as of 05/31/2021  ?Medication Sig  ? aspirin EC 81 MG tablet Take 1 tablet (81 mg total) by mouth daily.  ? cetirizine (ZYRTEC) 10 MG tablet Take 10 mg by mouth daily as needed for allergies.  ? fenofibrate 160 MG tablet TAKE 1 TABLET EVERY DAY  ? furosemide (LASIX) 40 MG tablet TAKE 1 TABLET EVERY DAY (Patient taking differently: Take 40 mg by mouth daily as needed.)  ? gabapentin (NEURONTIN) 600 MG tablet Take 1 tablet (600 mg total) by mouth 3 (three) times daily.  ? glucosamine-chondroitin 500-400 MG tablet Take 1 tablet by mouth 3 (three) times daily.  ? levothyroxine (SYNTHROID) 137 MCG tablet TAKE 1 TABLET BY MOUTH DAILY BEFORE BREAKFAST.  ? meloxicam (MOBIC) 7.5 MG tablet Take 1 tablet (7.5 mg total) by mouth daily.  ? methocarbamol (ROBAXIN) 500 MG tablet TAKE 1 TABLET THREE TIMES DAILY  ? metoprolol tartrate (LOPRESSOR) 25 MG tablet TAKE 1 TABLET TWICE DAILY  ? omeprazole (PRILOSEC) 40 MG capsule TAKE 1 CAPSULE EVERY DAY  ? polyethylene glycol (MIRALAX / GLYCOLAX) 17 g packet Take 17 g by mouth daily as needed for moderate constipation.  ? traMADol (ULTRAM) 50 MG tablet TAKE 2 TABLETS (100 MG TOTAL) BY MOUTH IN THE MORNING, AT NOON, AND AT BEDTIME.  ? ?No facility-administered encounter medications on file as of  05/31/2021.  ? ? ?05-31-2021: 1st attempt left VM ?06-01-2021: 2nd attempt left VM ? ?Star Rating Drugs: ?None ? ?Malecca Hicks CMA ?Clinical Pharmacist Assistant ?[phone] ? ?

## 2021-06-12 ENCOUNTER — Telehealth: Payer: Self-pay | Admitting: Family Medicine

## 2021-06-12 NOTE — Telephone Encounter (Signed)
? ?  Caitlin Ho has been scheduled for the following appointment: ? ?WHAT: SCREENING MAMMOGRAM ?WHERE: [facility] ?DATE: 06/25/21 ?TIME: 6:00 PM ARRIVAL TIME ? ?Patient has been made aware. ? ?

## 2021-06-13 ENCOUNTER — Other Ambulatory Visit: Payer: Medicare HMO

## 2021-06-13 DIAGNOSIS — M1611 Unilateral primary osteoarthritis, right hip: Secondary | ICD-10-CM | POA: Diagnosis not present

## 2021-06-13 DIAGNOSIS — E038 Other specified hypothyroidism: Secondary | ICD-10-CM | POA: Diagnosis not present

## 2021-06-13 DIAGNOSIS — E782 Mixed hyperlipidemia: Secondary | ICD-10-CM | POA: Diagnosis not present

## 2021-06-14 ENCOUNTER — Other Ambulatory Visit: Payer: Self-pay

## 2021-06-14 DIAGNOSIS — E038 Other specified hypothyroidism: Secondary | ICD-10-CM

## 2021-06-14 LAB — COMPREHENSIVE METABOLIC PANEL
ALT: 15 IU/L (ref 0–32)
AST: 16 IU/L (ref 0–40)
Albumin/Globulin Ratio: 1.9 (ref 1.2–2.2)
Albumin: 4.2 g/dL (ref 3.8–4.8)
Alkaline Phosphatase: 70 IU/L (ref 44–121)
BUN/Creatinine Ratio: 31 — ABNORMAL HIGH (ref 12–28)
BUN: 28 mg/dL — ABNORMAL HIGH (ref 8–27)
Bilirubin Total: 0.3 mg/dL (ref 0.0–1.2)
CO2: 26 mmol/L (ref 20–29)
Calcium: 9.5 mg/dL (ref 8.7–10.3)
Chloride: 103 mmol/L (ref 96–106)
Creatinine, Ser: 0.9 mg/dL (ref 0.57–1.00)
Globulin, Total: 2.2 g/dL (ref 1.5–4.5)
Glucose: 117 mg/dL — ABNORMAL HIGH (ref 70–99)
Potassium: 4.9 mmol/L (ref 3.5–5.2)
Sodium: 141 mmol/L (ref 134–144)
Total Protein: 6.4 g/dL (ref 6.0–8.5)
eGFR: 72 mL/min/{1.73_m2} (ref >59)

## 2021-06-14 LAB — TSH: TSH: 5 u[IU]/mL — ABNORMAL HIGH (ref 0.450–4.500)

## 2021-06-14 MED ORDER — LEVOTHYROXINE SODIUM 150 MCG PO TABS: 150.0000 ug | ORAL_TABLET | Freq: Every day | ORAL | 0 refills | Status: DC

## 2021-06-15 NOTE — Progress Notes (Signed)
Surgery orders requested via Epic inbox. °

## 2021-06-18 NOTE — H&P (Signed)
HIP ARTHROPLASTY ADMISSION H&P ? ?Patient ID: ?Caitlin Ho ?MRN: [id number] ?DOB/AGE: [birth year] 66 y.o. ? ?Chief Complaint: right hip pain. ? ?Planned Procedure Date: 07-03-21 ?Medical and Cardiac Clearance by Dr. Rochel Brome   ? ? ?HPI: ?Caitlin Ho is a 66 y.o. female who presents for evaluation of OA RIGHT HIP. The patient has a history of pain and functional disability in the right hip due to arthritis and has failed non-surgical conservative treatments for greater than 12 weeks to include NSAID's and/or analgesics, corticosteriod injections, use of assistive devices, and activity modification.  Onset of symptoms was gradual, starting 6 months ago with rapidlly worsening course since that time. The patient noted no past surgery on the right hip.  Patient currently rates pain at 8 out of 10 with activity. Patient has night pain, worsening of pain with activity and weight bearing, and pain that interferes with activities of daily living.  Patient has evidence of subchondral sclerosis, periarticular osteophytes, and joint space narrowing by imaging studies.  There is no active infection. ? ?Past Medical History:  ?Diagnosis Date  ? Cancer [facility])   ? cervical cancer cells whwn patient was 22   ? Cervicalgia   ? Fibromyalgia   ? GERD (gastroesophageal reflux disease)   ? not on medication  ? Hashimoto's disease   ? Headache(784.0)   ? sinus  ? Homonymous hemianopia   ? Hyperlipidemia   ? Hypertension   ? Hypothyroid   ? Other insomnia   ? Other sequelae of cerebral infarction   ? Palpitations   ? Pneumonia   ? PONV (postoperative nausea and vomiting)   ? 06/20/14 patient reported that BP dropped real low and she had N/V  and was slow to awaken when she had surgery at an outpatient center.  ? Pre-diabetes   ? Prediabetes   ? Seasonal allergies   ? Sleep apnea   ? no CPAP  ? Stroke [facility])   ? Supraventricular tachycardia ([place])   ? Vitamin D deficiency   ? ?Past Surgical History:  ?Procedure Laterality Date  ? ABDOMINAL  HYSTERECTOMY    ? APPENDECTOMY  1978  ? BACK SURGERY    ? CERVICAL FUSION  2017  ? 2 levels  ? DILATION AND CURETTAGE OF UTERUS    ? [place] SURGERY  10/03/2012  ? Dr Ellene Route  2 Discectomy  ? [place] SURGERY  07/2012  ? LUMBAR FUSION  2015  ? Fusion (2 levels)  ? LUMBAR FUSION  2016  ? fusion 1 level.  ? LUMBAR FUSION  02/16/2013  ? Fusion lumbar.  ? THYROIDECTOMY  2006  ? THYROIDECTOMY  2006  ? TUBAL LIGATION    ? ?Allergies  ?Allergen Reactions  ? Topamax [Topiramate] Other (See Comments)  ?  Fatigue, constipation, gum tenderness, breast tenderness  ? Lipitor [Atorvastatin]   ?  Myalgia  ? Pravachol [Pravastatin]   ?  Joint pain  ? ?Prior to Admission medications   ?Medication Sig Start Date End Date Taking? Authorizing Provider  ?aspirin EC 81 MG tablet Take 1 tablet (81 mg total) by mouth daily. 07/06/20  Yes Costella, Vista Mink, PA-C  ?cetirizine (ZYRTEC) 10 MG tablet Take 10 mg by mouth daily.   Yes [provider]  ?fenofibrate 160 MG tablet TAKE 1 TABLET EVERY DAY 03/15/21  Yes Lillard Anes, MD  ?furosemide (LASIX) 40 MG tablet TAKE 1 TABLET EVERY DAY ?Patient taking differently: Take 20 mg by mouth daily. 08/22/20  Yes  Marge Duncans, PA-C  ?gabapentin (NEURONTIN) 600 MG tablet Take 1 tablet (600 mg total) by mouth 3 (three) times daily. 10/18/20  Yes Cox, Kirsten, MD  ?glucosamine-chondroitin 500-400 MG tablet Take 1 tablet by mouth daily.   Yes [provider]  ?levothyroxine (SYNTHROID) 150 MCG tablet Take 1 tablet (150 mcg total) by mouth daily before breakfast. 06/14/21  Yes Cox, Kirsten, MD  ?meloxicam (MOBIC) 7.5 MG tablet Take 1 tablet (7.5 mg total) by mouth daily. 05/04/21  Yes Cox, Kirsten, MD  ?methocarbamol (ROBAXIN) 500 MG tablet TAKE 1 TABLET THREE TIMES DAILY 05/21/21  Yes Cox, Kirsten, MD  ?metoprolol tartrate (LOPRESSOR) 25 MG tablet TAKE 1 TABLET TWICE DAILY 04/09/21  Yes Marge Duncans, PA-C  ?omeprazole (PRILOSEC) 40 MG capsule TAKE 1 CAPSULE EVERY DAY 05/20/21  Yes  Marge Duncans, PA-C  ?polyethylene glycol (MIRALAX / GLYCOLAX) 17 g packet Take 17 g by mouth daily as needed for moderate constipation.   Yes [provider]  ?traMADol (ULTRAM) 50 MG tablet TAKE 2 TABLETS (100 MG TOTAL) BY MOUTH IN THE MORNING, AT NOON, AND AT BEDTIME. ?Patient taking differently: Take 100 mg by mouth 3 (three) times daily as needed for severe pain. 05/10/21  Yes CoxElnita Maxwell, MD  ? ?Social History  ? ?Socioeconomic History  ? Marital status: Married  ?  Spouse name: Not on file  ? Number of children: 1  ? Years of education: Not on file  ? Highest education level: Not on file  ?Occupational History  ? Occupation: Disabled  ?Tobacco Use  ? Smoking status: Never  ? Smokeless tobacco: Never  ?Vaping Use  ? Vaping Use: Never used  ?Substance and Sexual Activity  ? Alcohol use: No  ? Drug use: No  ? Sexual activity: Yes  ?  Partners: Male  ?  Birth control/protection: None  ?Other Topics Concern  ? Not on file  ?Social History Narrative  ? Not on file  ? ?Social Determinants of Health  ? ?Financial Resource Strain: Not on file  ?Food Insecurity: Not on file  ?Transportation Needs: Not on file  ?Physical Activity: Not on file  ?Stress: Not on file  ?Social Connections: Not on file  ? ?Family History  ?Problem Relation Age of Onset  ? Aneurysm Mother   ?     brain  ? Heart attack Father 66  ? Cancer Paternal Grandmother   ?     Breast  ? Hyperlipidemia Other   ? Hypertension Other   ? Diabetes Other   ? ? ?ROS: Currently denies lightheadedness, dizziness, Fever, chills, CP, SOB.   ?No personal history of DVT, PE, or MI. ?+ h/o CVA 2019 ?No loose teeth or dentures ?All other systems have been reviewed and were otherwise currently negative with the exception of those mentioned in the HPI and as above. ? ?Objective: ?Vitals: Ht: 5'5" Wt: 206 lbs Temp: 97.9 BP: 118/78 Pulse: 71 O2 95% on room air.   ?Physical Exam: ?General: Alert, NAD. Trendelenberg Gait  ?HEENT: EOMI, Good Neck Extension  ?Pulm: No  increased work of breathing.  Clear B/L A/P w/o crackle or wheeze.  ?CV: RRR, No m/g/r appreciated  ?GI: soft, NT, ND. BS x 4 quadrants ?Neuro: CN II-XII grossly intact without focal deficit.  Sensation intact distally ?Skin: No lesions in the area of chief complaint ?MSK/Surgical Site: + TTP. Decreased ROM d/t pain. + [place].  SLR. + FABER/FADIR. Decreased strength.  NVI.   ? ?Imaging Review ?Plain radiographs demonstrate severe  degenerative joint disease of the right hip.  ? ?The bone quality appears to be fair for age and reported activity level. ? ?Preoperative templating of the joint replacement has been completed, documented, and submitted to the Operating Room personnel in order to optimize intra-operative equipment management. ? ?Assessment: ?OA RIGHT HIP ?Active Problems: ?  * No active hospital problems. * ? ? ?Plan: ?Plan for Procedure(s): ?TOTAL HIP ARTHROPLASTY ANTERIOR APPROACH ? ?The patient history, physical exam, clinical judgement of the provider and imaging are consistent with end stage degenerative joint disease and total joint arthroplasty is deemed medically necessary. The treatment options including medical management, injection therapy, and arthroplasty were discussed at length. The risks and benefits of Procedure(s): ?TOTAL HIP ARTHROPLASTY ANTERIOR APPROACH were presented and reviewed.  ?The risks of nonoperative treatment, versus surgical intervention including but not limited to continued pain, aseptic loosening, stiffness, dislocation/subluxation, infection, bleeding, nerve injury, blood clots, cardiopulmonary complications, morbidity, mortality, among others were discussed. The patient verbalizes understanding and wishes to proceed with the plan.  ?Patient is being admitted for surgery, pain control, PT, prophylactic antibiotics, VTE prophylaxis, progressive ambulation, ADL's and discharge planning.  ? ?Dental prophylaxis discussed and recommended for 2 years  postoperatively. ? ?The patient does meet the criteria for TXA which will be used perioperatively.   ?Xarelto '10mg'$  daily will be used postoperatively for DVT prophylaxis in addition to SCDs, and early ambulation. ?Plan for Oxyc

## 2021-06-19 ENCOUNTER — Other Ambulatory Visit: Payer: Self-pay | Admitting: Family Medicine

## 2021-06-19 NOTE — Patient Instructions (Signed)
2  ONE VISITORS ARE ALLOWED TO COME WITH YOU AND STAY IN THE WAITING ROOM ONLY DURING PRE OP AND PROCEDURE.   ? ?**NO VISITORS ARE ALLOWED IN THE SHORT STAY AREA OR RECOVERY ROOM!!** ? ?IF YOU WILL BE ADMITTED INTO THE HOSPITAL YOU ARE ALLOWED ONLY 4 SUPPORT PEOPLE DURING VISITATION HOURS ONLY (7 AM -8PM)   ?The support person(s) must pass our screening, gel in and out, and wear a mask at all times, including in the patient?s room. ?Patients must also wear a mask when staff or their support person are in the room. ?Visitors GUEST BADGE MUST BE WORN VISIBLY  ?One adult visitor may remain with you overnight and MUST be in the room by 8 P.M. ?  ? ? Your procedure is scheduled on: 07/03/21 ? ? Report to [facility] Main Entrance ? ?  Report to admitting at 7:25 AM ? ? Call this number if you have problems the morning of surgery [phone] ? ? Do not eat food :After Midnight. ? ? After Midnight you may have the following liquids until __7:00____ AM/ DAY OF SURGERY ? ?Water ?Black Coffee (sugar ok, NO MILK/CREAM OR CREAMERS)  ?Tea (sugar ok, NO MILK/CREAM OR CREAMERS) regular and decaf                             ?Plain Jell-O (NO RED)                                           ?Fruit ices (not with fruit pulp, NO RED)                                     ?Popsicles (NO RED)                                                                  ?Juice: apple, WHITE grape, WHITE cranberry ?Sports drinks like Gatorade (NO RED) ?Clear broth(vegetable,chicken,beef) ? ?             ? ?  ?  ?The day of surgery:  ?Drink ONE (1)  G2 at 6:45 AM the morning of surgery. Drink in one sitting. Do not sip.  ?This drink was given to you during your hospital  ?pre-op appointment visit. ?Nothing else to drink after completing the  ? G2.at 7:00 AM ?  ?       If you have questions, please contact your surgeon?s office. ? ? ?FOLLOW BOWEL PREP AND ANY ADDITIONAL PRE OP INSTRUCTIONS YOU RECEIVED FROM YOUR SURGEON'S OFFICE!!! ?  ?  ?Oral  Hygiene is also important to reduce your risk of infection.                                    ?Remember - BRUSH YOUR TEETH THE MORNING OF SURGERY WITH YOUR REGULAR TOOTHPASTE ? ? Do NOT smoke after Midnight ? ? Take these medicines the morning of surgery with A SIP  OF WATER: Gabapentin, Metoprolol, Levothyroxine, Omeprazole ? ? ?Bring CPAP mask and tubing day of surgery. ?                  ?           You may not have any metal on your body including hair pins, jewelry, and body piercing ? ?           Do not wear make-up, lotions, powders, perfumes/cologne, or deodorant ? ?Do not wear nail polish including gel and S&S, artificial/acrylic nails, or any other type of covering on natural nails including finger and toenails. If you have artificial nails, gel coating, etc. that needs to be removed by a nail salon please have this removed prior to surgery or surgery may need to be canceled/ delayed if the surgeon/ anesthesia feels like they are unable to be safely monitored.  ? ?Do not shave  48 hours prior to surgery.  ? ? ? Do not bring valuables to the hospital. [facility] NOT ?            RESPONSIBLE   FOR VALUABLES. ? ? Contacts, dentures or bridgework may not be worn into surgery. ? ? Bri ?  ? Patients discharged on the day of surgery will not be allowed to drive home.  Someone NEEDS to stay with you for the first 24 hours after anesthesia. ? ? Special Instructions: Bring a copy of your healthcare power of attorney and living will documents the day of surgery if you haven't scanned them before. ? ?            Please read over the following fact sheets you were given: IF [place] [phone] ? ?   [facility] - Preparing for Surgery ?Before surgery, you can play an important role.  Because skin is not sterile, your skin needs to be as free of germs as possible.  You can reduce the number of germs on your skin by washing with CHG (chlorahexidine gluconate)  soap before surgery.  CHG is an antiseptic cleaner which kills germs and bonds with the skin to continue killing germs even after washing. ?Please DO NOT use if you have an allergy to CHG or antibacterial soaps.  If your skin becomes reddened/irritated stop using the CHG and inform your nurse when you arrive at Short Stay. ?Do not shave (including legs and underarms) for at least 48 hours prior to the first CHG shower. ?Please follow these instructions carefully: ? 1.  Shower with CHG Soap the night before surgery and the  morning of Surgery. ? 2.  If you choose to wash your hair, wash your hair first as usual with your  normal  shampoo. ? 3.  After you shampoo, rinse your hair and body thoroughly to remove the  shampoo.                        ?    4.  Use CHG as you would any other liquid soap.  You can apply chg directly  to the skin and wash  ?                     Gently with a scrungie or clean washcloth. ? 5.  Apply the CHG Soap to your body ONLY FROM THE NECK DOWN.   Do not use on face/ open      ?  Wound or open sores. Avoid contact with eyes, ears mouth and genitals (private parts).  ?                     Production manager,  Genitals (private parts) with your normal soap. ?            6.  Wash thoroughly, paying special attention to the area where your surgery  will be performed. ? 7.  Thoroughly rinse your body with warm water from the neck down. ? 8.  DO NOT shower/wash with your normal soap after using and rinsing off  the CHG Soap. ?               9.  Pat yourself dry with a clean towel. ?           10.  Wear clean pajamas. ?           11.  Place clean sheets on your bed the night of your first shower and do not  sleep with pets. ?Day of Surgery : ?Do not apply any lotions/deodorants the morning of surgery.  Please wear clean clothes to the hospital/surgery center. ? ?FAILURE TO FOLLOW THESE INSTRUCTIONS MAY RESULT IN THE CANCELLATION OF YOUR  SURGERY ? ? ?________________________________________________________________________  ? ?Incentive Spirometer ? ?An incentive spirometer is a tool that can help keep your lungs clear and active. This tool measures how well you are filling your lungs with each breath. Taking long deep breaths may help reverse or decrease the chance of developing breathing (pulmonary) problems (especially infection) following: ?A long period of time when you are unable to move or be active. ?BEFORE THE PROCEDURE  ?If the spirometer includes an indicator to show your best effort, your nurse or respiratory therapist will set it to a desired goal. ?If possible, sit up straight or lean slightly forward. Try not to slouch. ?Hold the incentive spirometer in an upright position. ?INSTRUCTIONS FOR USE  ?Sit on the edge of your bed if possible, or sit up as far as you can in bed or on a chair. ?Hold the incentive spirometer in an upright position. ?Breathe out normally. ?Place the mouthpiece in your mouth and seal your lips tightly around it. ?Breathe in slowly and as deeply as possible, raising the piston or the ball toward the top of the column. ?Hold your breath for 3-5 seconds or for as long as possible. Allow the piston or ball to fall to the bottom of the column. ?Remove the mouthpiece from your mouth and breathe out normally. ?Rest for a few seconds and repeat Steps 1 through 7 at least 10 times every 1-2 hours when you are awake. Take your time and take a few normal breaths between deep breaths. ?The spirometer may include an indicator to show your best effort. Use the indicator as a goal to work toward during each repetition. ?After each set of 10 deep breaths, practice coughing to be sure your lungs are clear. If you have an incision (the cut made at the time of surgery), support your incision when coughing by placing a pillow or rolled up towels firmly against it. ?Once you are able to get out of bed, walk around indoors and cough  well. You may stop using the incentive spirometer when instructed by your caregiver.  ?RISKS AND COMPLICATIONS ?Take your time so you do not get dizzy or light-headed. ?If you are in pain, you may need to take or ask for pain medication  before doing incentive

## 2021-06-20 ENCOUNTER — Encounter (HOSPITAL_COMMUNITY): Payer: Self-pay

## 2021-06-20 ENCOUNTER — Encounter (HOSPITAL_COMMUNITY)
Admission: RE | Admit: 2021-06-20 | Discharge: 2021-06-20 | Disposition: A | Discharge status: Home / Self Care | Payer: Medicare HMO | Source: Ambulatory Visit | Attending: Orthopedic Surgery | Admitting: Orthopedic Surgery

## 2021-06-20 ENCOUNTER — Other Ambulatory Visit: Payer: Self-pay

## 2021-06-20 VITALS — BP 127/92 | HR 72 | Temp 98.2°F | Resp 18 | Ht 64.0 in | Wt 205.4 lb

## 2021-06-20 DIAGNOSIS — I10 Essential (primary) hypertension: Secondary | ICD-10-CM

## 2021-06-20 DIAGNOSIS — R7303 Prediabetes: Secondary | ICD-10-CM | POA: Diagnosis not present

## 2021-06-20 DIAGNOSIS — Z01812 Encounter for preprocedural laboratory examination: Secondary | ICD-10-CM | POA: Insufficient documentation

## 2021-06-20 DIAGNOSIS — Z01818 Encounter for other preprocedural examination: Secondary | ICD-10-CM

## 2021-06-20 LAB — BASIC METABOLIC PANEL
Anion gap: 4 — ABNORMAL LOW (ref 5–15)
BUN: 19 mg/dL (ref 8–23)
CO2: 28 mmol/L (ref 22–32)
Calcium: 9 mg/dL (ref 8.9–10.3)
Chloride: 106 mmol/L (ref 98–111)
Creatinine, Ser: 0.83 mg/dL (ref 0.44–1.00)
GFR, Estimated: >60 mL/min (ref >60)
Glucose, Bld: 100 mg/dL — ABNORMAL HIGH (ref 70–99)
Potassium: 4.3 mmol/L (ref 3.5–5.1)
Sodium: 138 mmol/L (ref 135–145)

## 2021-06-20 LAB — HEMOGLOBIN A1C
Hgb A1c MFr Bld: 6 % — ABNORMAL HIGH (ref 4.8–5.6)
Mean Plasma Glucose: 125.5 mg/dL

## 2021-06-20 LAB — CBC
HCT: 43.6 % (ref 36.0–46.0)
Hemoglobin: 14.1 g/dL (ref 12.0–15.0)
MCH: 30 pg (ref 26.0–34.0)
MCHC: 32.3 g/dL (ref 30.0–36.0)
MCV: 92.8 fL (ref 80.0–100.0)
Platelets: 393 10*3/uL (ref 150–400)
RBC: 4.7 MIL/uL (ref 3.87–5.11)
RDW: 13.1 % (ref 11.5–15.5)
WBC: 10.3 10*3/uL (ref 4.0–10.5)
nRBC: 0 % (ref 0.0–0.2)

## 2021-06-20 LAB — GLUCOSE, CAPILLARY: Glucose-Capillary: 102 mg/dL — ABNORMAL HIGH (ref 70–99)

## 2021-06-20 LAB — TYPE AND SCREEN
ABO/RH(D): O POS
Antibody Screen: NEGATIVE

## 2021-06-20 LAB — SURGICAL PCR SCREEN
MRSA, PCR: NEGATIVE
Staphylococcus aureus: NEGATIVE

## 2021-06-20 NOTE — Progress Notes (Signed)
Anesthesia note: ? ?Bowel prep reminder:NA ? ?PCP - Dr. Raliegh Ip. Cox ?Cardiologist -no ?Other-  ? ?Chest x-ray - 05/14/21-chart ?EKG - 05/10/21-chart ?Stress Test - 2019-epic ?ECHO - 2019-epic ?Cardiac Cath - NA ? ?Pacemaker/ICD device last checked:NA ? ?Sleep Study - yes ?CPAP - no can't tolerate the mask ? ?Pt is pre diabetic-yes ?Fasting Blood Sugar -  ?Checks Blood Sugar _____ ? ?Blood Thinner:ASA 81 mg /Dr. Tobie Poet ?Blood Thinner Instructions:Stop 5 days prior to DOS/ Dr. Tobie Poet ?Aspirin Instructions: ?Last Dose:06/27/21 ? ?Anesthesia review: yes ? ?Patient denies shortness of breath, fever, cough and chest pain at PAT appointment ?Pt has no SOB with activities. Her back is full of hardware and fussed. She will need to be positioned while awake because of her neck. She has som limited ROM in her neck. ? ?Patient verbalized understanding of instructions that were given to them at the PAT appointment. Patient was also instructed that they will need to review over the PAT instructions again at home before surgery. Yes. Pt was with her husband. ?

## 2021-06-27 NOTE — Care Plan (Signed)
Ortho Bundle Case Management Note ? ?Patient Details  ?Name: Caitlin Ho ?MRN: [id number] ?Date of Birth: 04-25-1955 ? ?  Met with patient in the office prior to surgery. She will discharge to home with her family to assist. Has equipment at home. OPPT set up with [facility]. Patient and MD in agreement with plan. Choice offered               ? ? ? ?DME Arranged:    ?DME Agency:    ? ?HH Arranged:    ?[facility] Agency:    ? ?Additional Comments: ?Please contact me with any questions of if this plan should need to change. ? ?Mardelle Matte  [facility] Orthopaedic Specialist  [phone] ?06/27/2021, 11:40 AM ?  ?

## 2021-07-03 ENCOUNTER — Ambulatory Visit (HOSPITAL_COMMUNITY): Payer: Medicare HMO

## 2021-07-03 ENCOUNTER — Encounter (HOSPITAL_COMMUNITY): Payer: Self-pay | Admitting: Orthopedic Surgery

## 2021-07-03 ENCOUNTER — Encounter (HOSPITAL_COMMUNITY)
Admission: RE | Disposition: A | Discharge status: Home / Self Care | Payer: Self-pay | Source: Ambulatory Visit | Attending: Orthopedic Surgery

## 2021-07-03 ENCOUNTER — Ambulatory Visit (HOSPITAL_COMMUNITY)
Admission: RE | Admit: 2021-07-03 | Discharge: 2021-07-03 | Disposition: A | Discharge status: Home / Self Care | Payer: Medicare HMO | Source: Ambulatory Visit | Attending: Orthopedic Surgery | Admitting: Orthopedic Surgery

## 2021-07-03 ENCOUNTER — Other Ambulatory Visit: Payer: Self-pay

## 2021-07-03 ENCOUNTER — Ambulatory Visit (HOSPITAL_COMMUNITY): Payer: Medicare HMO | Admitting: Physician Assistant

## 2021-07-03 ENCOUNTER — Ambulatory Visit (HOSPITAL_BASED_OUTPATIENT_CLINIC_OR_DEPARTMENT_OTHER): Payer: Medicare HMO | Admitting: Anesthesiology

## 2021-07-03 DIAGNOSIS — Z471 Aftercare following joint replacement surgery: Secondary | ICD-10-CM | POA: Diagnosis not present

## 2021-07-03 DIAGNOSIS — M1611 Unilateral primary osteoarthritis, right hip: Secondary | ICD-10-CM | POA: Diagnosis not present

## 2021-07-03 DIAGNOSIS — G4730 Sleep apnea, unspecified: Secondary | ICD-10-CM

## 2021-07-03 DIAGNOSIS — E890 Postprocedural hypothyroidism: Secondary | ICD-10-CM | POA: Diagnosis not present

## 2021-07-03 DIAGNOSIS — I10 Essential (primary) hypertension: Secondary | ICD-10-CM

## 2021-07-03 DIAGNOSIS — I69398 Other sequelae of cerebral infarction: Secondary | ICD-10-CM | POA: Diagnosis not present

## 2021-07-03 DIAGNOSIS — M797 Fibromyalgia: Secondary | ICD-10-CM | POA: Diagnosis not present

## 2021-07-03 DIAGNOSIS — H538 Other visual disturbances: Secondary | ICD-10-CM | POA: Diagnosis not present

## 2021-07-03 DIAGNOSIS — E039 Hypothyroidism, unspecified: Secondary | ICD-10-CM | POA: Diagnosis not present

## 2021-07-03 DIAGNOSIS — H5347 Heteronymous bilateral field defects: Secondary | ICD-10-CM | POA: Insufficient documentation

## 2021-07-03 DIAGNOSIS — K219 Gastro-esophageal reflux disease without esophagitis: Secondary | ICD-10-CM | POA: Diagnosis not present

## 2021-07-03 DIAGNOSIS — Z96641 Presence of right artificial hip joint: Secondary | ICD-10-CM

## 2021-07-03 HISTORY — PX: TOTAL HIP ARTHROPLASTY: SHX124

## 2021-07-03 HISTORY — DX: Presence of right artificial hip joint: Z96.641

## 2021-07-03 SURGERY — ARTHROPLASTY, HIP, TOTAL, ANTERIOR APPROACH
Anesthesia: Monitor Anesthesia Care | Site: Hip | Laterality: Right

## 2021-07-03 MED ORDER — CHLORHEXIDINE GLUCONATE 0.12 % MT SOLN
15.0000 mL | Freq: Once | OROMUCOSAL | Status: AC
  Administered 2021-07-03: 15 mL via OROMUCOSAL

## 2021-07-03 MED ORDER — OXYCODONE HCL 5 MG PO TABS
5.0000 mg | ORAL_TABLET | Freq: Once | ORAL | Status: AC | PRN
  Administered 2021-07-03: 5 mg via ORAL

## 2021-07-03 MED ORDER — METHOCARBAMOL 500 MG PO TABS: 500.0000 mg | ORAL_TABLET | Freq: Four times a day (QID) | ORAL | Status: DC | PRN

## 2021-07-03 MED ORDER — LACTATED RINGERS IV BOLUS
250.0000 mL | Freq: Once | INTRAVENOUS | Status: AC
  Administered 2021-07-03: 250 mL via INTRAVENOUS

## 2021-07-03 MED ORDER — ACETAMINOPHEN 500 MG PO TABS: 1000.0000 mg | ORAL_TABLET | Freq: Four times a day (QID) | ORAL | 0 refills | Status: DC | PRN

## 2021-07-03 MED ORDER — AMISULPRIDE (ANTIEMETIC) 5 MG/2ML IV SOLN
INTRAVENOUS | Status: AC
  Filled 2021-07-03: qty 4

## 2021-07-03 MED ORDER — FENTANYL CITRATE (PF) 100 MCG/2ML IJ SOLN
INTRAMUSCULAR | Status: DC | PRN
  Administered 2021-07-03: 100 ug via INTRAVENOUS

## 2021-07-03 MED ORDER — AMISULPRIDE (ANTIEMETIC) 5 MG/2ML IV SOLN
10.0000 mg | Freq: Once | INTRAVENOUS | Status: AC | PRN
  Administered 2021-07-03: 10 mg via INTRAVENOUS

## 2021-07-03 MED ORDER — PROPOFOL 500 MG/50ML IV EMUL
INTRAVENOUS | Status: DC | PRN
  Administered 2021-07-03: 75 ug/kg/min via INTRAVENOUS

## 2021-07-03 MED ORDER — FENTANYL CITRATE PF 50 MCG/ML IJ SOSY: 25.0000 ug | PREFILLED_SYRINGE | INTRAMUSCULAR | Status: DC | PRN

## 2021-07-03 MED ORDER — POVIDONE-IODINE 10 % EX SWAB: 2.0000 "application " | Freq: Once | CUTANEOUS | Status: DC

## 2021-07-03 MED ORDER — ACETAMINOPHEN 500 MG PO TABS
1000.0000 mg | ORAL_TABLET | Freq: Once | ORAL | Status: AC
  Administered 2021-07-03: 1000 mg via ORAL
  Filled 2021-07-03: qty 2

## 2021-07-03 MED ORDER — OXYCODONE HCL 5 MG PO TABS: 5.0000 mg | ORAL_TABLET | Freq: Four times a day (QID) | ORAL | 0 refills | Status: DC | PRN

## 2021-07-03 MED ORDER — ACETAMINOPHEN 500 MG PO TABS: 1000.0000 mg | ORAL_TABLET | Freq: Once | ORAL | Status: DC

## 2021-07-03 MED ORDER — DEXAMETHASONE SODIUM PHOSPHATE 10 MG/ML IJ SOLN
8.0000 mg | Freq: Once | INTRAMUSCULAR | Status: AC
  Administered 2021-07-03: 10 mg via INTRAVENOUS

## 2021-07-03 MED ORDER — LACTATED RINGERS IV SOLN: INTRAVENOUS | Status: DC

## 2021-07-03 MED ORDER — TRANEXAMIC ACID-NACL 1000-0.7 MG/100ML-% IV SOLN
1000.0000 mg | INTRAVENOUS | Status: AC
  Administered 2021-07-03: 1000 mg via INTRAVENOUS
  Filled 2021-07-03: qty 100

## 2021-07-03 MED ORDER — 0.9 % SODIUM CHLORIDE (POUR BTL) OPTIME
TOPICAL | Status: DC | PRN
  Administered 2021-07-03: 1000 mL

## 2021-07-03 MED ORDER — ONDANSETRON HCL 4 MG/2ML IJ SOLN: 4.0000 mg | Freq: Once | INTRAMUSCULAR | Status: DC | PRN

## 2021-07-03 MED ORDER — SODIUM CHLORIDE FLUSH 0.9 % IV SOLN
INTRAVENOUS | Status: DC | PRN
Start: 2021-07-03 — End: 2021-07-03
  Administered 2021-07-03: 20 mL via INTRAVENOUS

## 2021-07-03 MED ORDER — BUPIVACAINE IN DEXTROSE 0.75-8.25 % IT SOLN
INTRATHECAL | Status: DC | PRN
  Administered 2021-07-03: 1.6 mL via INTRATHECAL

## 2021-07-03 MED ORDER — PROPOFOL 1000 MG/100ML IV EMUL
INTRAVENOUS | Status: AC
  Filled 2021-07-03: qty 100

## 2021-07-03 MED ORDER — OXYCODONE HCL 5 MG PO TABS
ORAL_TABLET | ORAL | Status: AC
  Filled 2021-07-03: qty 1

## 2021-07-03 MED ORDER — CEFAZOLIN SODIUM-DEXTROSE 2-4 GM/100ML-% IV SOLN
2.0000 g | INTRAVENOUS | Status: AC
  Administered 2021-07-03: 2 g via INTRAVENOUS
  Filled 2021-07-03: qty 100

## 2021-07-03 MED ORDER — BUPIVACAINE LIPOSOME 1.3 % IJ SUSP: 10.0000 mL | Freq: Once | INTRAMUSCULAR | Status: DC

## 2021-07-03 MED ORDER — MELOXICAM 15 MG PO TABS: 15.0000 mg | ORAL_TABLET | Freq: Every day | ORAL | 0 refills | Status: DC | PRN

## 2021-07-03 MED ORDER — CEFAZOLIN SODIUM-DEXTROSE 2-4 GM/100ML-% IV SOLN: 2.0000 g | Freq: Four times a day (QID) | INTRAVENOUS | Status: DC

## 2021-07-03 MED ORDER — BUPIVACAINE LIPOSOME 1.3 % IJ SUSP
INTRAMUSCULAR | Status: AC
  Filled 2021-07-03: qty 20

## 2021-07-03 MED ORDER — ORAL CARE MOUTH RINSE: 15.0000 mL | Freq: Once | OROMUCOSAL | Status: AC

## 2021-07-03 MED ORDER — SODIUM CHLORIDE (PF) 0.9 % IJ SOLN
INTRAMUSCULAR | Status: AC
  Filled 2021-07-03: qty 20

## 2021-07-03 MED ORDER — OXYCODONE HCL 5 MG/5ML PO SOLN: 5.0000 mg | Freq: Once | ORAL | Status: AC | PRN

## 2021-07-03 MED ORDER — LACTATED RINGERS IV BOLUS
500.0000 mL | Freq: Once | INTRAVENOUS | Status: AC
  Administered 2021-07-03: 500 mL via INTRAVENOUS

## 2021-07-03 MED ORDER — TRANEXAMIC ACID-NACL 1000-0.7 MG/100ML-% IV SOLN: 1000.0000 mg | Freq: Once | INTRAVENOUS | Status: DC

## 2021-07-03 MED ORDER — METHOCARBAMOL 500 MG IVPB - SIMPLE MED
500.0000 mg | Freq: Four times a day (QID) | INTRAVENOUS | Status: DC | PRN
  Filled 2021-07-03: qty 50

## 2021-07-03 MED ORDER — RIVAROXABAN 10 MG PO TABS: 10.0000 mg | ORAL_TABLET | Freq: Every day | ORAL | 0 refills | Status: DC

## 2021-07-03 MED ORDER — WATER FOR IRRIGATION, STERILE IR SOLN
Status: DC | PRN
  Administered 2021-07-03: 2000 mL

## 2021-07-03 MED ORDER — FENTANYL CITRATE (PF) 100 MCG/2ML IJ SOLN
INTRAMUSCULAR | Status: AC
  Filled 2021-07-03: qty 2

## 2021-07-03 MED ORDER — ONDANSETRON HCL 4 MG/2ML IJ SOLN
INTRAMUSCULAR | Status: DC | PRN
  Administered 2021-07-03: 4 mg via INTRAVENOUS

## 2021-07-03 MED ORDER — ONDANSETRON 4 MG PO TBDP: 4.0000 mg | ORAL_TABLET | Freq: Two times a day (BID) | ORAL | 0 refills | Status: DC | PRN

## 2021-07-03 MED ORDER — BUPIVACAINE LIPOSOME 1.3 % IJ SUSP
INTRAMUSCULAR | Status: DC | PRN
  Administered 2021-07-03: 20 mL

## 2021-07-03 SURGICAL SUPPLY — 48 items
BAG COUNTER SPONGE SURGICOUNT (BAG) IMPLANT
BAG SPEC THK2 15X12 ZIP CLS (MISCELLANEOUS)
BAG SPNG CNTER NS LX DISP (BAG)
BAG ZIPLOCK 12X15 (MISCELLANEOUS) IMPLANT
BLADE SAG 18X100X1.27 (BLADE) ×2 IMPLANT
BLADE SURG SZ10 CARB STEEL (BLADE) ×2 IMPLANT
CHLORAPREP W/TINT 26 (MISCELLANEOUS) ×2 IMPLANT
CLSR STERI-STRIP ANTIMIC 1/2X4 (GAUZE/BANDAGES/DRESSINGS) ×2 IMPLANT
COVER PERINEAL POST (MISCELLANEOUS) ×2 IMPLANT
COVER SURGICAL LIGHT HANDLE (MISCELLANEOUS) ×2 IMPLANT
DRAPE IMP U-DRAPE 54X76 (DRAPES) ×2 IMPLANT
DRAPE STERI IOBAN 125X83 (DRAPES) ×2 IMPLANT
DRAPE U-SHAPE 47X51 STRL (DRAPES) ×4 IMPLANT
DRSG MEPILEX BORDER 4X8 (GAUZE/BANDAGES/DRESSINGS) ×2 IMPLANT
ELECT REM PT RETURN 15FT ADLT (MISCELLANEOUS) ×2 IMPLANT
GLOVE BIO SURGEON STRL SZ7.5 (GLOVE) ×4 IMPLANT
GLOVE BIOGEL PI IND STRL 7.5 (GLOVE) ×1 IMPLANT
GLOVE BIOGEL PI IND STRL 8 (GLOVE) ×1 IMPLANT
GLOVE BIOGEL PI INDICATOR 7.5 (GLOVE) ×1
GLOVE BIOGEL PI INDICATOR 8 (GLOVE) ×1
GLOVE SURG POLYISO LF SZ7.5 (GLOVE) ×2 IMPLANT
GLOVE SURG SYN 7.5  E (GLOVE) ×2
GLOVE SURG SYN 7.5 E (GLOVE) ×1 IMPLANT
GLOVE SURG SYN 7.5 PF PI (GLOVE) ×1 IMPLANT
GOWN STRL REUS W/ TWL LRG LVL3 (GOWN DISPOSABLE) ×1 IMPLANT
GOWN STRL REUS W/TWL LRG LVL3 (GOWN DISPOSABLE) ×2
HEAD BIOLOX HIP 36/+2.5 (Joint) IMPLANT
HIP BIOLOX HD 36/+2.5 (Joint) ×2 IMPLANT
HOLDER FOLEY CATH W/STRAP (MISCELLANEOUS) IMPLANT
INSERT 0 DEGREE 36 (Miscellaneous) ×1 IMPLANT
KIT TURNOVER KIT A (KITS) IMPLANT
MANIFOLD NEPTUNE II (INSTRUMENTS) ×2 IMPLANT
NS IRRIG 1000ML POUR BTL (IV SOLUTION) ×2 IMPLANT
PACK ANTERIOR HIP CUSTOM (KITS) ×2 IMPLANT
PROTECTOR NERVE ULNAR (MISCELLANEOUS) ×2 IMPLANT
SCREW HEX LP 6.5X20 (Screw) ×1 IMPLANT
SHELL ACETAB TRIDENT 48 (Shell) ×1 IMPLANT
SPIKE FLUID TRANSFER (MISCELLANEOUS) ×4 IMPLANT
SPONGE T-LAP 18X18 ~~LOC~~+RFID (SPONGE) ×6 IMPLANT
STEM HIP 4 127DEG (Stem) ×1 IMPLANT
SUT MNCRL AB 3-0 PS2 18 (SUTURE) ×2 IMPLANT
SUT VIC AB 0 CT1 36 (SUTURE) ×2 IMPLANT
SUT VIC AB 1 CT1 36 (SUTURE) ×2 IMPLANT
SUT VIC AB 2-0 CT1 27 (SUTURE) ×4
SUT VIC AB 2-0 CT1 TAPERPNT 27 (SUTURE) ×2 IMPLANT
TRAY FOLEY MTR SLVR 16FR STAT (SET/KITS/TRAYS/PACK) IMPLANT
TUBE SUCTION HIGH CAP CLEAR NV (SUCTIONS) ×2 IMPLANT
WATER STERILE IRR 1000ML POUR (IV SOLUTION) ×4 IMPLANT

## 2021-07-03 NOTE — Op Note (Signed)
07/03/2021 ? ?12:05 PM ? ?PATIENT:  Caitlin Ho  ? ?MRN: [id number] ? ?PRE-OPERATIVE DIAGNOSIS:  OA RIGHT HIP ? ?POST-OPERATIVE DIAGNOSIS:  OA RIGHT HIP ? ?PROCEDURE:  Procedure(s): ?TOTAL HIP ARTHROPLASTY ANTERIOR APPROACH ? ?PREOPERATIVE INDICATIONS:   ? ?Caitlin Ho is an 66 y.o. female who has a diagnosis of <principal problem not specified> and elected for surgical management after failing conservative treatment.  The risks benefits and alternatives were discussed with the patient including but not limited to the risks of nonoperative treatment, versus surgical intervention including infection, bleeding, nerve injury, periprosthetic fracture, the need for revision surgery, dislocation, leg length discrepancy, blood clots, cardiopulmonary complications, morbidity, mortality, among others, and they were willing to proceed.   ? ? ?OPERATIVE REPORT  ?   ?SURGEON:   Renette Butters, MD ?   ?ASSISTANT:  Aggie Moats, PA-C, he was present and scrubbed throughout the case, critical for completion in a timely fashion, and for retraction, instrumentation, and closure. ? ?   ?ANESTHESIA:  General ?   ?COMPLICATIONS:  None.  ?   ?COMPONENTS:  [employer] acolade fit femur size 4 with a 36 mm +2.5 head ball and an acetabular shell size 48 with a  polyethylene liner ?   ?PROCEDURE IN DETAIL:  ? ?The patient was met in the holding area and  identified.  The appropriate hip was identified and marked at the operative site.  The patient was then transported to the OR  and  placed under anesthesia per that record.  At that point, the patient was  placed in the supine position and  secured to the operating room table and all bony prominences padded. He received pre-operative antibiotics ?   ?The operative lower extremity was prepped from the iliac crest to the distal leg.  Sterile draping was performed.  Time out was performed prior to incision.   ?   ?Skin incision was made just 2 cm lateral to the ASIS  extending in line with the  tensor fascia lata. Electrocautery was used to control all bleeders. I dissected down sharply to the fascia of the tensor fascia lata was confirmed that the muscle fibers beneath were running posteriorly. I then incised the fascia over the superficial tensor fascia lata in line with the incision. The fascia was elevated off the anterior aspect of the muscle the muscle was retracted posteriorly and protected throughout the case. I then used electrocautery to incise the tensor fascia lata fascia control and all bleeders. Immediately visible was the fat over top of the anterior neck and capsule. ? ?I removed the anterior fat from the capsule and elevated the rectus muscle off of the anterior capsule. I then removed a large time of capsule. The retractors were then placed over the anterior acetabulum as well as around the superior and inferior neck. ? ?I then made a femoral neck cut. Then used the power corkscrew to remove the femoral head from the acetabulum and thoroughly irrigated the acetabulum. I sized the femoral head. ?   ?I then exposed the deep acetabulum, cleared out any tissue including the ligamentum teres.   After adequate visualization, I excised the labrum, and then sequentially reamed.  I then impacted the acetabular implant into place using fluoroscopy for guidance.  Appropriate version and inclination was confirmed clinically matching their bony anatomy, and with fluoroscopy. ? ?I placed a 20 mm screw in the posterior/superio position with an excellent bite.  ? ? I then placed the polyethylene liner in  place ? ?I then adducted the leg and released the external rotators from the posterior femur allowing it to be easily delivered up lateral and anterior to the acetabulum for preparation of the femoral canal. ?   ?I then prepared the proximal femur using the cookie-cutter and then sequentially reamed and broached. ? ?A trial broach, neck, and head was utilized, and I reduced the hip and used floroscopy to  assess the neck length and femoral implant. ? ?I then impacted the femoral prosthesis into place into the appropriate version. The hip was then reduced and fluoroscopy confirmed appropriate position. Leg lengths were restored. ? ?I then irrigated the hip copiously again with, and repaired the fascia with Vicryl, followed by monocryl for the subcutaneous tissue, Monocryl for the skin, Steri-Strips and sterile gauze. The patient was then awakened and returned to PACU in stable and satisfactory condition. There were no complications. ? ?POST OPERATIVE PLAN: WBAT, DVT px: SCD's/TED, ambulation and chemical dvt px ? ?Edmonia Lynch, MD ?Orthopedic Surgeon ?[phone]  ? ? ? ?

## 2021-07-03 NOTE — Discharge Instructions (Signed)

## 2021-07-03 NOTE — Transfer of Care (Signed)
Immediate Anesthesia Transfer of Care Note ? ?Patient: Caitlin Ho ? ?Procedure(s) Performed: TOTAL HIP ARTHROPLASTY ANTERIOR APPROACH (Right: Hip) ? ?Patient Location: PACU ? ?Anesthesia Type:Spinal ? ?Level of Consciousness: sedated, patient cooperative and responds to stimulation ? ?Airway & Oxygen Therapy: Patient Spontanous Breathing and Patient connected to face mask oxygen ? ?Post-op Assessment: Report given to RN and Post -op Vital signs reviewed and stable ? ?Post vital signs: Reviewed and stable ? ?Last Vitals:  ?Vitals Value Taken Time  ?BP    ?Temp    ?Pulse    ?Resp    ?SpO2    ? ? ?Last Pain:  ?Vitals:  ? 07/03/21 0835  ?TempSrc:   ?PainSc: 5   ?   ? ?Patients Stated Pain Goal: 3 (07/03/21 7858) ? ?Complications: No notable events documented. ?

## 2021-07-03 NOTE — Anesthesia Preprocedure Evaluation (Addendum)
Anesthesia Evaluation  ?Patient identified by MRN, date of birth, ID band ?Patient awake ? ? ? ?Reviewed: ?Allergy & Precautions, NPO status , Patient's Chart, lab work & pertinent test results ? ?History of Anesthesia Complications ?(+) PONV and history of anesthetic complications ? ?Airway ?Mallampati: III ? ?TM Distance: <3 FB ?Neck ROM: Full ? ? ?Comment: Narrow palate Dental ?no notable dental hx. ? ?  ?Pulmonary ?sleep apnea ,  ?  ?Pulmonary exam normal ?breath sounds clear to auscultation ? ? ? ? ? ? Cardiovascular ?hypertension, negative cardio ROS ?Normal cardiovascular exam ?Rhythm:Regular Rate:Normal ? ? ?  ?Neuro/Psych ? Headaches,  Neuromuscular disease (lumbar radiculopathy) CVA (hemianopsia), Residual Symptoms negative psych ROS  ? GI/Hepatic ?Neg liver ROS, GERD  ,  ?Endo/Other  ?Hypothyroidism Morbid obesityprediabetes ? Renal/GU ?negative Renal ROS  ?negative genitourinary ?  ?Musculoskeletal ? ?(+) Arthritis , Osteoarthritis,  Fibromyalgia - ? Abdominal ?  ?Peds ?negative pediatric ROS ?(+)  Hematology ?negative hematology ROS ?(+)   ?Anesthesia Other Findings ? ? Reproductive/Obstetrics ?negative OB ROS ? ?  ? ? ? ? ? ? ? ? ? ? ? ? ? ?  ?  ? ? ? ? ? ? ?Anesthesia Physical ?Anesthesia Plan ? ?ASA: 3 ? ?Anesthesia Plan: Spinal and MAC  ? ?Post-op Pain Management: Tylenol PO (pre-op)*  ? ?Induction: Intravenous ? ?PONV Risk Score and Plan: 3 and Treatment may vary due to age or medical condition, Ondansetron, Dexamethasone and Propofol infusion ? ?Airway Management Planned: Oral ETT ? ?Additional Equipment: None ? ?Intra-op Plan:  ? ?Post-operative Plan: Extubation in OR ? ?Informed Consent: I have reviewed the patients History and Physical, chart, labs and discussed the procedure including the risks, benefits and alternatives for the proposed anesthesia with the patient or authorized representative who has indicated his/her understanding and acceptance.   ? ? ? ?Dental advisory given ? ?Plan Discussed with: CRNA, Anesthesiologist and Surgeon ? ?Anesthesia Plan Comments: (Patient has had extensive back surgery but would still prefer to try a spinal due to her history of PONV. Will attempt spinal with GETA as backup plan. Norton Blizzard, MD  ?)  ? ? ? ? ?Anesthesia Quick Evaluation ? ?

## 2021-07-03 NOTE — Interval H&P Note (Signed)
History and Physical Interval Note: ? ?07/03/2021 ?9:07 AM ? ?Caitlin Ho  has presented today for surgery, with the diagnosis of OA RIGHT HIP.  The various methods of treatment have been discussed with the patient and family. After consideration of risks, benefits and other options for treatment, the patient has consented to  Procedure(s): ?TOTAL HIP ARTHROPLASTY ANTERIOR APPROACH (Right) as a surgical intervention.  The patient's history has been reviewed, patient examined, no change in status, stable for surgery.  I have reviewed the patient's chart and labs.  Questions were answered to the patient's satisfaction.   ? ? ?Renette Butters ? ? ?

## 2021-07-03 NOTE — Evaluation (Signed)
Physical Therapy Evaluation ?Patient Details ?Name: Caitlin Ho ?MRN: [id number] ?DOB: 1956-01-07 ?Today's Date: 07/03/2021 ? ?History of Present Illness ? Pt is a 66yo female presenting s/p R-THA, anterior approach on 07/03/21. PMH: fibromyalgia, GERD, hypothyroidism, HLD, HTN, hx of stroke 2019, lumbar fusion (multiple levels), cervical fusion.  ? ?Clinical Impression ?  ?Caitlin Ho is a 66 y.o. female POD 0 s/p R-THA, anterior approach. Patient reports modified independence using SPC and RW alternately with mobility at baseline. Patient is now limited by functional impairments (see PT problem list below) and requires min guard for transfers and gait with RW. Patient was able to ambulate 80 feet with RW and min guard and cues for safe walker management. Patient educated on safe sequencing for stair mobility and pt's husband demonstrated safe guarding position for people assisting with mobility. Patient instructed in exercises to facilitate ROM and circulation. Patient will benefit from continued skilled PT interventions to address impairments and progress towards PLOF. Patient has met mobility goals at adequate level for discharge home; will continue to follow if pt continues acute stay to progress towards Mod I goals.   ?   ? ?Recommendations for follow up therapy are one component of a multi-disciplinary discharge planning process, led by the attending physician.  Recommendations may be updated based on patient status, additional functional criteria and insurance authorization. ? ?Follow Up Recommendations Follow physician's recommendations for discharge plan and follow up therapies ? ?  ?Assistance Recommended at Discharge Set up Supervision/Assistance  ?Patient can return home with the following ? A little help with walking and/or transfers;A little help with bathing/dressing/bathroom;Assistance with cooking/housework;Assist for transportation;Help with stairs or ramp for entrance ? ?  ?Equipment  Recommendations None recommended by PT (pt has recommended DME)  ?Recommendations for Other Services ?    ?  ?Functional Status Assessment Patient has had a recent decline in their functional status and demonstrates the ability to make significant improvements in function in a reasonable and predictable amount of time.  ? ?  ?Precautions / Restrictions Restrictions ?Weight Bearing Restrictions: No ?Other Position/Activity Restrictions: WBAT  ? ?  ? ?Mobility ? Bed Mobility ?Overal bed mobility: Needs Assistance ?Bed Mobility: Supine to Sit ?  ?  ?Supine to sit: Min assist ?  ?  ?General bed mobility comments: Pt min assist to bring RLE off bed and elevate trunk. ?  ? ?Transfers ?Overall transfer level: Needs assistance ?Equipment used: Rolling walker (2 wheels) ?Transfers: Sit to/from Stand ?Sit to Stand: Min guard ?  ?  ?  ?  ?  ?General transfer comment: Pt min guard for safety only, no physical assist required ?  ? ?Ambulation/Gait ?Ambulation/Gait assistance: Min guard, Supervision ?Gait Distance (Feet): 80 Feet ?Assistive device: Rolling walker (2 wheels) ?Gait Pattern/deviations: Step-to pattern, Decreased stance time - right, Decreased step length - left ?Gait velocity: decreased ?  ?  ?General Gait Details: Pt ambulated with RW and min guard assist progressed to supervision. Pt had one minor LOB to L but was able to self correct, reported "I tried to turn too quickly," suspect it was secondary to decreased sesnsation on L foot. Pt's husband provided safe guarding for last 26f of ambulation task. ? ?Stairs ?Stairs: Yes ?Stairs assistance: Supervision ?Stair Management: One rail Left, Step to pattern, Forwards ?Number of Stairs: 2 ?General stair comments: Pt educated on safe stair mobility with 1 rail and HHA from husband, verbalized understanding. PT supervised pt and husband utilizing safe technique with step-to pattern. No overt  LOB ? ?Wheelchair Mobility ?  ? ?Modified Rankin (Stroke Patients Only) ?  ? ?   ? ?Balance Overall balance assessment: Needs assistance ?Sitting-balance support: Feet unsupported, No upper extremity supported ?Sitting balance-Leahy Scale: Fair ?  ?  ?Standing balance support: Reliant on assistive device for balance, During functional activity, Bilateral upper extremity supported ?Standing balance-Leahy Scale: Poor ?  ?  ?  ?  ?  ?  ?  ?  ?  ?  ?  ?  ?   ? ? ? ?Pertinent Vitals/Pain Pain Assessment ?Pain Assessment: 0-10 ?Pain Score: 7  ?Pain Location: R hip ?Pain Descriptors / Indicators: Operative site guarding, Discomfort ?Pain Intervention(s): Limited activity within patient's tolerance, Monitored during session, Repositioned, Ice applied  ? ? ?Home Living Family/patient expects to be discharged to:: Private residence ?Living Arrangements: Spouse/significant other ?Available Help at Discharge: Family;Available 24 hours/day ?Type of Home: House ?Home Access: Stairs to enter ?Entrance Stairs-Rails: Can reach both;Left;Right ?Entrance Stairs-Number of Steps: 5 ?  ?Home Layout: One level ?Home Equipment: Rolling Walker (2 wheels);BSC/3in1;Shower seat - built in;Grab bars - tub/shower ?   ?  ?Prior Function Prior Level of Function : Independent/Modified Independent ?  ?  ?  ?  ?  ?  ?Mobility Comments: RW early in the day, switch to the cane later in the day ?ADLs Comments: Assistance with cooking, IND with bathing/dressing ?  ? ? ?Hand Dominance  ? Dominant Hand: Right ? ?  ?Extremity/Trunk Assessment  ? Upper Extremity Assessment ?Upper Extremity Assessment: Overall WFL for tasks assessed ?  ? ?Lower Extremity Assessment ?Lower Extremity Assessment: RLE deficits/detail;LLE deficits/detail ?RLE Deficits / Details: MMT ank pf/df 5/5 ?RLE Sensation: WNL ?LLE Deficits / Details: MMT ank pf/df 5/5 ?LLE Sensation: decreased light touch ?  ? ?Cervical / Trunk Assessment ?Cervical / Trunk Assessment: Back Surgery;Neck Surgery  ?Communication  ? Communication: No difficulties  ?Cognition  Arousal/Alertness: Awake/alert ?Behavior During Therapy: [facility] for tasks assessed/performed ?Overall Cognitive Status: Within Functional Limits for tasks assessed ?  ?  ?  ?  ?  ?  ?  ?  ?  ?  ?  ?  ?  ?  ?  ?  ?  ?  ?  ? ?  ?General Comments   ? ?  ?Exercises Total Joint Exercises ?Ankle Circles/Pumps: AROM, Both, 20 reps ?Quad Sets: AROM, Right, Other reps (comment) (2) ?Short Arc Quad: AROM, Right, Other reps (comment) (2) ?Heel Slides: AAROM, Right, Other reps (comment) (3) ?Hip ABduction/ADduction: AAROM, Right, Other reps (comment) (3)  ? ?Assessment/Plan  ?  ?PT Assessment Patient needs continued PT services  ?PT Problem List Decreased strength;Decreased range of motion;Decreased activity tolerance;Decreased balance;Decreased mobility;Decreased coordination;Decreased knowledge of use of DME ? ?   ?  ?PT Treatment Interventions DME instruction;Gait training;Stair training;Functional mobility training;Therapeutic activities;Therapeutic exercise;Balance training;Neuromuscular re-education;Patient/family education   ? ?PT Goals (Current goals can be found in the Care Plan section)  ?Acute Rehab PT Goals ?Patient Stated Goal: I want to be able to go out and walk when I want to ?PT Goal Formulation: With patient ?Time For Goal Achievement: 07/10/21 ?Potential to Achieve Goals: Good ? ?  ?Frequency 7X/week ?  ? ? ?Co-evaluation   ?  ?  ?  ?  ? ? ?  ?AM-PAC PT "6 Clicks" Mobility  ?Outcome Measure Help needed turning from your back to your side while in a flat bed without using bedrails?: None ?Help needed moving from lying on your back to sitting  on the side of a flat bed without using bedrails?: A Little ?Help needed moving to and from a bed to a chair (including a wheelchair)?: A Little ?Help needed standing up from a chair using your arms (e.g., wheelchair or bedside chair)?: A Little ?Help needed to walk in hospital room?: A Little ?Help needed climbing 3-5 steps with a railing? : A Little ?6 Click Score: 19 ? ?   ?End of Session Equipment Utilized During Treatment: Gait belt ?Activity Tolerance: Patient tolerated treatment well ?Patient left: in chair;with call bell/phone within reach;with nursing/sitter in room;with family/visitor pres

## 2021-07-03 NOTE — Anesthesia Procedure Notes (Signed)
Spinal ? ?Patient location during procedure: OR ?Start time: 07/03/2021 10:40 AM ?End time: 07/03/2021 10:47 AM ?Reason for block: surgical anesthesia ?Staffing ?Performed: anesthesiologist  ?Anesthesiologist: Merlinda Frederick, MD ?Preanesthetic Checklist ?Completed: patient identified, IV checked, risks and benefits discussed, surgical consent, monitors and equipment checked, pre-op evaluation and timeout performed ?Spinal Block ?Patient position: sitting ?Prep: DuraPrep ?Patient monitoring: cardiac monitor, continuous pulse ox and blood pressure ?Approach: midline ?Location: L2-3 ?Injection technique: single-shot ?Needle ?Needle type: Pencan  ?Needle gauge: 24 G ?Needle length: 9 cm ?Assessment ?Events: CSF return ?Additional Notes ?Functioning IV was confirmed and monitors were applied. Sterile prep and drape, including hand hygiene and sterile gloves were used. The patient was positioned and the spine was prepped. The skin was anesthetized with lidocaine.  Free flow of clear CSF was obtained prior to injecting local anesthetic into the CSF.  The spinal needle aspirated freely following injection.  The needle was carefully withdrawn.  The patient tolerated the procedure well.  ? ? ? ?

## 2021-07-04 ENCOUNTER — Encounter (HOSPITAL_COMMUNITY): Payer: Self-pay | Admitting: Orthopedic Surgery

## 2021-07-04 NOTE — Anesthesia Postprocedure Evaluation (Signed)
Anesthesia Post Note ? ?Patient: Caitlin Ho ? ?Procedure(s) Performed: TOTAL HIP ARTHROPLASTY ANTERIOR APPROACH (Right: Hip) ? ?  ? ?Patient location during evaluation: PACU ?Anesthesia Type: MAC and Spinal ?Level of consciousness: oriented and awake and alert ?Pain management: pain level controlled ?Vital Signs Assessment: post-procedure vital signs reviewed and stable ?Respiratory status: spontaneous breathing and respiratory function stable ?Cardiovascular status: blood pressure returned to baseline and stable ?Postop Assessment: no headache, no backache, no apparent nausea or vomiting and patient able to bend at knees ?Anesthetic complications: no ? ? ?No notable events documented. ? ?Last Vitals:  ?Vitals:  ? 07/03/21 1430 07/03/21 1445  ?BP: 135/78 128/82  ?Pulse: 74 79  ?Resp: 18   ?Temp: 36.5 ?C   ?SpO2: 91% 93%  ?  ?Last Pain:  ?Vitals:  ? 07/03/21 1445  ?TempSrc:   ?PainSc: 5   ? ? ?  ?  ?  ?  ?  ?  ? ?Merlinda Frederick ? ? ? ? ?

## 2021-07-05 DIAGNOSIS — Z96642 Presence of left artificial hip joint: Secondary | ICD-10-CM | POA: Diagnosis not present

## 2021-07-05 DIAGNOSIS — R2689 Other abnormalities of gait and mobility: Secondary | ICD-10-CM | POA: Diagnosis not present

## 2021-07-05 DIAGNOSIS — M25551 Pain in right hip: Secondary | ICD-10-CM | POA: Diagnosis not present

## 2021-07-10 DIAGNOSIS — Z96642 Presence of left artificial hip joint: Secondary | ICD-10-CM | POA: Diagnosis not present

## 2021-07-10 DIAGNOSIS — M25551 Pain in right hip: Secondary | ICD-10-CM | POA: Diagnosis not present

## 2021-07-10 DIAGNOSIS — R2689 Other abnormalities of gait and mobility: Secondary | ICD-10-CM | POA: Diagnosis not present

## 2021-07-12 DIAGNOSIS — M25551 Pain in right hip: Secondary | ICD-10-CM | POA: Diagnosis not present

## 2021-07-12 DIAGNOSIS — R2689 Other abnormalities of gait and mobility: Secondary | ICD-10-CM | POA: Diagnosis not present

## 2021-07-12 DIAGNOSIS — Z96642 Presence of left artificial hip joint: Secondary | ICD-10-CM | POA: Diagnosis not present

## 2021-07-16 ENCOUNTER — Other Ambulatory Visit: Payer: Self-pay | Admitting: Family Medicine

## 2021-07-16 NOTE — Telephone Encounter (Signed)
Refill sent to pharmacy.   

## 2021-07-17 DIAGNOSIS — R2689 Other abnormalities of gait and mobility: Secondary | ICD-10-CM | POA: Diagnosis not present

## 2021-07-17 DIAGNOSIS — Z96642 Presence of left artificial hip joint: Secondary | ICD-10-CM | POA: Diagnosis not present

## 2021-07-17 DIAGNOSIS — M25551 Pain in right hip: Secondary | ICD-10-CM | POA: Diagnosis not present

## 2021-07-18 DIAGNOSIS — M1611 Unilateral primary osteoarthritis, right hip: Secondary | ICD-10-CM | POA: Diagnosis not present

## 2021-07-19 DIAGNOSIS — R2689 Other abnormalities of gait and mobility: Secondary | ICD-10-CM | POA: Diagnosis not present

## 2021-07-19 DIAGNOSIS — Z96642 Presence of left artificial hip joint: Secondary | ICD-10-CM | POA: Diagnosis not present

## 2021-07-19 DIAGNOSIS — M25551 Pain in right hip: Secondary | ICD-10-CM | POA: Diagnosis not present

## 2021-07-24 DIAGNOSIS — M25551 Pain in right hip: Secondary | ICD-10-CM | POA: Diagnosis not present

## 2021-07-24 DIAGNOSIS — R2689 Other abnormalities of gait and mobility: Secondary | ICD-10-CM | POA: Diagnosis not present

## 2021-07-24 DIAGNOSIS — Z96642 Presence of left artificial hip joint: Secondary | ICD-10-CM | POA: Diagnosis not present

## 2021-07-26 ENCOUNTER — Ambulatory Visit (INDEPENDENT_AMBULATORY_CARE_PROVIDER_SITE_OTHER): Payer: Medicare HMO | Admitting: Nurse Practitioner

## 2021-07-26 ENCOUNTER — Encounter: Payer: Self-pay | Admitting: Nurse Practitioner

## 2021-07-26 VITALS — BP 132/78 | HR 77 | Temp 96.2°F | Ht 65.0 in | Wt 209.0 lb

## 2021-07-26 DIAGNOSIS — R7303 Prediabetes: Secondary | ICD-10-CM

## 2021-07-26 DIAGNOSIS — R2689 Other abnormalities of gait and mobility: Secondary | ICD-10-CM | POA: Diagnosis not present

## 2021-07-26 DIAGNOSIS — E782 Mixed hyperlipidemia: Secondary | ICD-10-CM

## 2021-07-26 DIAGNOSIS — I10 Essential (primary) hypertension: Secondary | ICD-10-CM

## 2021-07-26 DIAGNOSIS — E038 Other specified hypothyroidism: Secondary | ICD-10-CM | POA: Diagnosis not present

## 2021-07-26 DIAGNOSIS — Z96642 Presence of left artificial hip joint: Secondary | ICD-10-CM | POA: Diagnosis not present

## 2021-07-26 DIAGNOSIS — Z5320 Procedure and treatment not carried out because of patient's decision for unspecified reasons: Secondary | ICD-10-CM

## 2021-07-26 DIAGNOSIS — Z2820 Immunization not carried out because of patient decision for unspecified reason: Secondary | ICD-10-CM

## 2021-07-26 DIAGNOSIS — K219 Gastro-esophageal reflux disease without esophagitis: Secondary | ICD-10-CM | POA: Diagnosis not present

## 2021-07-26 DIAGNOSIS — M25551 Pain in right hip: Secondary | ICD-10-CM | POA: Diagnosis not present

## 2021-07-26 NOTE — Progress Notes (Signed)
Subjective:  Patient ID: Caitlin Ho, female    DOB: Jun 07, 1955  Age: 66 y.o. MRN: [id number]  Chief Complaint  Patient presents with   Hyperlipidemia   Hypertension   Hypothyroidism   Prediabetes   Gastroesophageal Reflux   HPI: Caitlin Ho is a 66 year old Caucasian female that presents for follow-up of hyperlipidemia, HTN, and prediabetes. She underwent Rt THA with Dr Percell Miller 3 weeks ago. States she is currently attending PT and recovery is going well.    Prediabetes  follow-up  Lab Results  Component Value Date   HGBA1C 6.0 (H) 06/20/2021   HGBA1C 6.1 (H) 04/26/2021   HGBA1C 6.2 (H) 01/22/2021   Last seen for diabetes 3 months ago.  Controlled with diet and exercise. She reports excellent compliance with treatment.   --------------------------------------------------------------------------------------------------- Hypertension, follow-up  BP Readings from Last 3 Encounters:  07/03/21 128/82  06/20/21 (!) 127/92  05/10/21 112/66   Wt Readings from Last 3 Encounters:  07/26/21 209 lb (94.8 kg)  06/20/21 205 lb 7 oz (93.2 kg)  05/10/21 205 lb (93 kg)     She was last seen for hypertension 3 months ago.  BP at that visit was 112/66. Management includes Lopressor 25 mg BID She reports excellent compliance with treatment. She is not having side effects.   She does not smoke.  Use of agents associated with hypertension: thyroid hormones.   --------------------------------------------------------------------------------------------------- Lipid/Cholesterol, follow-up  Last Lipid Panel: Lab Results  Component Value Date   CHOL 160 04/26/2021   LDLCALC 90 04/26/2021   HDL 50 04/26/2021   TRIG 113 04/26/2021    She was last seen for this 3 months ago.  Management  includes heart healthy diet.  She reports excellent compliance with treatment. She is not having side effects.   She is following a Regular diet. Current exercise:  PT   Last metabolic panel Lab  Results  Component Value Date   GLUCOSE 100 (H) 06/20/2021   NA 138 06/20/2021   K 4.3 06/20/2021   BUN 19 06/20/2021   CREATININE 0.83 06/20/2021   GFRNONAA >60 06/20/2021   GFRAA 71 02/22/2020   CALCIUM 9.0 06/20/2021   AST 16 06/13/2021   ALT 15 06/13/2021    GERD, Follow up:  The patient was last seen for GERD 3 months ago. Current treatment consist of: Omeprazole  She reports excellent compliance with treatment. She is not having side effects. .     She is NOT experiencing belching and eructation or dysphagia   ---------------------------------------------------------------------------------------------------   Current Outpatient Medications on File Prior to Visit  Medication Sig Dispense Refill   acetaminophen (TYLENOL) 500 MG tablet Take 2 tablets (1,000 mg total) by mouth every 6 (six) hours as needed for mild pain or moderate pain. 60 tablet 0   aspirin EC 81 MG tablet Take 1 tablet (81 mg total) by mouth daily. 30 tablet 11   cetirizine (ZYRTEC) 10 MG tablet Take 10 mg by mouth daily.     fenofibrate 160 MG tablet TAKE 1 TABLET EVERY DAY 90 tablet 1   furosemide (LASIX) 40 MG tablet TAKE 1 TABLET EVERY DAY 90 tablet 1   gabapentin (NEURONTIN) 600 MG tablet Take 1 tablet (600 mg total) by mouth 3 (three) times daily. 270 tablet 3   glucosamine-chondroitin 500-400 MG tablet Take 1 tablet by mouth daily.     levothyroxine (SYNTHROID) 150 MCG tablet Take 1 tablet (150 mcg total) by mouth daily before breakfast. 90 tablet 0   meloxicam (MOBIC)  15 MG tablet Take 1 tablet (15 mg total) by mouth daily as needed for pain (and inflammation). 30 tablet 0   meloxicam (MOBIC) 7.5 MG tablet TAKE 1 TABLET EVERY DAY 90 tablet 0   methocarbamol (ROBAXIN) 500 MG tablet TAKE 1 TABLET THREE TIMES DAILY 270 tablet 1   metoprolol tartrate (LOPRESSOR) 25 MG tablet TAKE 1 TABLET TWICE DAILY 180 tablet 1   omeprazole (PRILOSEC) 40 MG capsule TAKE 1 CAPSULE EVERY DAY 90 capsule 0    ondansetron (ZOFRAN-ODT) 4 MG disintegrating tablet Take 1 tablet (4 mg total) by mouth 2 (two) times daily as needed for nausea or vomiting. 10 tablet 0   polyethylene glycol (MIRALAX / GLYCOLAX) 17 g packet Take 17 g by mouth daily as needed for moderate constipation.     rivaroxaban (XARELTO) 10 MG TABS tablet Take 1 tablet (10 mg total) by mouth daily. To prevent blood clots after surgery 30 tablet 0   traMADol (ULTRAM) 50 MG tablet TAKE 2 TABLETS (100 MG TOTAL) BY MOUTH IN THE MORNING, AT NOON, AND AT BEDTIME. (Patient taking differently: Take 100 mg by mouth 3 (three) times daily as needed for severe pain.) 180 tablet 0   No current facility-administered medications on file prior to visit.   Past Medical History:  Diagnosis Date   Cancer ([place])    cervical cancer cells whwn patient was 22    Cervicalgia    Fibromyalgia    GERD (gastroesophageal reflux disease)    not on medication   Hashimoto's disease    Headache(784.0)    sinus   Homonymous hemianopia    Hyperlipidemia    Hypertension    Hypothyroid    Other sequelae of cerebral infarction    PONV (postoperative nausea and vomiting)    06/20/14 patient reported that BP dropped real low and she had N/V  and was slow to awaken when she had surgery at an outpatient center.   Pre-diabetes    Seasonal allergies    Sleep apnea    no CPAP   Stroke ([place]) 2019   Vitamin D deficiency    Past Surgical History:  Procedure Laterality Date   ABDOMINAL HYSTERECTOMY  1995   APPENDECTOMY  1978   BACK SURGERY  06/29/2020   L5-S1   CERVICAL FUSION  2017   2 levels   DILATION AND CURETTAGE OF UTERUS     LUMBAR DISC SURGERY  10/03/2012   Dr Ellene Route  2 Discectomy   LUMBAR [place] SURGERY  07/2012   LUMBAR FUSION  2015   Fusion (2 levels)   LUMBAR FUSION  2016   fusion 1 level.   LUMBAR FUSION  02/16/2013   Fusion lumbar.   THYROIDECTOMY  2006   TOTAL HIP ARTHROPLASTY Right 07/03/2021   Procedure: TOTAL HIP ARTHROPLASTY ANTERIOR  APPROACH;  Surgeon: Renette Butters, MD;  Location: [facility];  Service: Orthopedics;  Laterality: Right;   TUBAL LIGATION  1990    Family History  Problem Relation Age of Onset   Aneurysm Mother        brain   Heart attack Father 42   Cancer Paternal Grandmother        Breast   Hyperlipidemia Other    Hypertension Other    Diabetes Other    Social History   Socioeconomic History   Marital status: Married    Spouse name: Not on file   Number of children: 1   Years of education: Not on file   Highest  education level: Not on file  Occupational History   Occupation: Disabled  Tobacco Use   Smoking status: Never   Smokeless tobacco: Never  Vaping Use   Vaping Use: Never used  Substance and Sexual Activity   Alcohol use: No   Drug use: No   Sexual activity: Yes    Partners: Male    Birth control/protection: None  Other Topics Concern   Not on file  Social History Narrative   Not on file   Social Determinants of Health   Financial Resource Strain: Not on file  Food Insecurity: Not on file  Transportation Needs: Not on file  Physical Activity: Not on file  Stress: Not on file  Social Connections: Not on file    Review of Systems  Constitutional:  Negative for chills, fatigue and fever.  HENT:  Negative for congestion, ear pain, rhinorrhea and sore throat.   Respiratory:  Negative for cough and shortness of breath.   Cardiovascular:  Negative for chest pain.  Gastrointestinal:  Negative for abdominal pain, constipation, diarrhea, nausea and vomiting.  Genitourinary:  Negative for dysuria and urgency.  Musculoskeletal:  Negative for back pain and myalgias.  Neurological:  Negative for dizziness, weakness, light-headedness and headaches.  Psychiatric/Behavioral:  Negative for dysphoric mood. The patient is not nervous/anxious.     Objective:  BP 132/78   Pulse 77   Temp (!) 96.2 F (35.7 C)   Ht '5\' 5"'$  (1.651 m)   Wt 209 lb (94.8 kg)   LMP  (LMP Unknown)    SpO2 96%   BMI 34.78 kg/m       07/26/2021    8:03 AM 07/03/2021    2:45 PM 07/03/2021    2:30 PM  BP/Weight  Systolic BP  097 353  Diastolic BP  82 78  Wt. (Lbs) 209    BMI 34.78 kg/m2      Physical Exam Vitals reviewed.  Constitutional:      Appearance: Normal appearance.  HENT:     Right Ear: Tympanic membrane normal.     Left Ear: Tympanic membrane normal.     Nose: Nose normal.     Mouth/Throat:     Mouth: Mucous membranes are moist.  Eyes:     Pupils: Pupils are equal, round, and reactive to light.  Cardiovascular:     Rate and Rhythm: Normal rate and regular rhythm.     Pulses: Normal pulses.     Heart sounds: Normal heart sounds.  Pulmonary:     Effort: Pulmonary effort is normal.     Breath sounds: Normal breath sounds.  Abdominal:     General: Bowel sounds are normal.     Palpations: Abdomen is soft.  Musculoskeletal:        General: Tenderness (right hip) present.  Skin:    General: Skin is warm and dry.     Capillary Refill: Capillary refill takes less than 2 seconds.  Neurological:     General: No focal deficit present.     Mental Status: She is alert and oriented to person, place, and time.  Psychiatric:        Mood and Affect: Mood normal.        Behavior: Behavior normal.        Lab Results  Component Value Date   WBC 10.3 06/20/2021   HGB 14.1 06/20/2021   HCT 43.6 06/20/2021   PLT 393 06/20/2021   GLUCOSE 100 (H) 06/20/2021   CHOL 160 04/26/2021  TRIG 113 04/26/2021   HDL 50 04/26/2021   LDLCALC 90 04/26/2021   ALT 15 06/13/2021   AST 16 06/13/2021   NA 138 06/20/2021   K 4.3 06/20/2021   CL 106 06/20/2021   CREATININE 0.83 06/20/2021   BUN 19 06/20/2021   CO2 28 06/20/2021   TSH 5.000 (H) 06/13/2021   HGBA1C 6.0 (H) 06/20/2021      Assessment & Plan:   1. Essential hypertension, benign-well controlled - CBC with Differential/Platelet - Comprehensive metabolic panel -continue Metoprolol 25 mg BID  2. Prediabetes-well  controlled - Hemoglobin A1c - Lipid panel  3. Mixed hyperlipidemia-well controlled - Lipid panel -heart healthy diet  4. Gastroesophageal reflux disease, unspecified whether esophagitis present-well controlled - CBC with Differential/Platelet - Comprehensive metabolic panel -continue Prilosec 40 mg QD -avoid foods that trigger GERD  5. Secondary hypothyroidism- - TSH -continue Levothyroxine 150 mcg QD  6. Colonoscopy refused  7. Vaccine refused by patient  -pt refuses Shingles vaccines     We will call you with lab results Continue medications Follow-up in 53-month, fasting  Follow-up: 375-month fasting  An After Visit Summary was printed and given to the patient.  Signed, ShRip HarbourNP [phone]

## 2021-07-26 NOTE — Patient Instructions (Signed)
We will call you with lab results Continue medications Follow-up in 71-month, fasting  Bone Health Information for Aging Adults Your bones do more than support your body. They also store calcium. The inside of your bones (marrow) makes blood cells. Maintaining bone health becomes more important as you age because your bones replace all their cells about every 10 years. Around age 64 it gets harder to replace those cells, and bones can become weak. Weak bones can lead to osteoporosis and breaks (fractures). Falls also become more likely, which can cause fractures. The good news is that, with diet and exercise, you can improve and maintain your bone health at any age. How to eat for bone health A balanced diet can supply many of the vitamins, minerals, and proteins you need for bone health. Older adults need to make sure they get enough calcium, vitamin D, and magnesium. You may need more of these as you age. Calcium  Calcium is the most important (essential) mineral for bone health. The daily requirement for calcium for adult men aged 60-70 years is 1,000 mg. For adult women aged 661-70years, it is 1,200 mg. At age 4475or older, it is 1,200 mg for both men and women. Sources of calcium in your diet include: Dairy foods like milk, yogurt, and cheese. Dairy foods are the best sources. If you cannot eat dairy, you may need a calcium supplement. Leafy, dark green vegetables. These include collard greens, kale, broccoli, bok choy, and okra. Fatty fish, like sardines and canned salmon with bones. Almonds. Tofu.  Vitamin D You need vitamin D for bone health because it helps your body absorb calcium from your diet. It is not found naturally in many foods. Many people can benefit from taking a supplement. The daily requirement for vitamin D at age 5824or older is 600-1,000 international units (IU). Dietary sources for vitamin D include: Fatty fish, such as swordfish, salmon, sardine, and mackerel. Foods  that have vitamin D added to them (are fortified), like cereal and dairy products. Egg yolks. Magnesium Magnesium helps your body use both calcium and vitamin D. The recommended daily intake for adult men is 400-420 mg. For adult women, it is 310-320 mg. Dietary sources of magnesium include: Green vegetables, such as collard greens, kale, bok choy, and okra. Poppy, sesame, and chia seeds. Legumes, including peas and beans. Whole grains. Avocados. Nuts. If you drink alcohol regularly or take a type of antacid called a proton pump inhibitor, you might benefit from a magnesium supplement. How to exercise for bone health Exercise is important for bone health because it strengthens bones and muscles. Bones are living organs that get stronger when you exercise them, just like your heart and other muscles. Muscles support your bones and protect your joints. Strong muscles also help prevent bone loss and falls. Weight-bearing exercises and resistance exercises are the two types of exercise that are most important for bone health. Weight-bearing exercises include running or walking, climbing stairs, and playing sports like tennis. Resistance exercises include those done using free weights, weight machines, or resistance bands. Try to get at least 30 minutes of exercise every day. You can also include stretching, balance, and flexibility exercises, like yoga or tai chi. These lower your risk of falls. Follow these instructions at home Ask your health care provider if: You need a bone density test. This is especially important for women older than 647and men older than 70. You have been losing height. Loss of height may  be a sign of weakening bones in your spine. Any of your medicines or medical conditions could affect your bone health. He or she could recommend an exercise program that is safe for you. Be sure it includes both weight-bearing and resistance exercises. Do not use any products that contain  nicotine or tobacco. These products include cigarettes, chewing tobacco, and vaping devices, such as e-cigarettes. These can reduce bone density. If you need help quitting, ask your health care provider. Drink alcohol in moderation. Alcohol reduces bone density and increases your risk for falls. If you drink alcohol: Limit how much you have to: 0-1 drink a day for women who are not pregnant. 0-2 drinks a day for men. Know how much alcohol is in a drink. In the [place] one drink equals one 12 oz bottle of beer (355 mL), one 5 oz glass of wine (148 mL), or one 1 oz glass of hard liquor (44 mL). Take over-the-counter and prescription medicines only as told by your health care provider. Ask your health care provider if you could benefit from taking calcium, vitamin D, or magnesium supplements. Keep all follow-up visits. This is important. Where to find more information American Bone Health: CardKnowledge.fi American Academy of Orthopaedic Surgeons: orthoinfo.[place]: bones.SouthEx[place].es Summary Maintaining your bone health becomes more important as you age. You can improve and maintain your bone health with diet and exercise at any age. A balanced diet can supply many of the vitamins, minerals, and proteins you need for bone health. Older adults need to make sure they get enough calcium, vitamin D, and magnesium. Ask your health care provider if you could benefit from taking calcium, vitamin D, or magnesium supplements. Exercise is important for bone health because it strengthens bones and muscles. Do both weight-bearing and resistance exercises. This information is not intended to replace advice given to you by your health care provider. Make sure you discuss any questions you have with your health care provider. Document Revised: 08/02/2020 Document Reviewed: 08/02/2020 Elsevier Patient Education  [place].  Prediabetes Eating Plan Prediabetes is a  condition that causes blood sugar (glucose) levels to be higher than normal. This increases the risk for developing type 2 diabetes (type 2 diabetes mellitus). Working with a health care provider or nutrition specialist (dietitian) to make diet and lifestyle changes can help prevent the onset of diabetes. These changes may help you: Control your blood glucose levels. Improve your cholesterol levels. Manage your blood pressure. What are tips for following this plan? Reading food labels Read food labels to check the amount of fat, salt (sodium), and sugar in prepackaged foods. Avoid foods that have: Saturated fats. Trans fats. Added sugars. Avoid foods that have more than 300 milligrams (mg) of sodium per serving. Limit your sodium intake to less than 2,300 mg each day. Shopping Avoid buying pre-made and processed foods. Avoid buying drinks with added sugar. Cooking Cook with olive oil. Do not use butter, lard, or ghee. Bake, broil, grill, steam, or boil foods. Avoid frying. Meal planning  Work with your dietitian to create an eating plan that is right for you. This may include tracking how many calories you take in each day. Use a food diary, notebook, or mobile application to track what you eat at each meal. Consider following a Mediterranean diet. This includes: Eating several servings of fresh fruits and vegetables each day. Eating fish at least twice a week. Eating one serving each day of whole grains, beans, nuts, and  seeds. Using olive oil instead of other fats. Limiting alcohol. Limiting red meat. Using nonfat or low-fat dairy products. Consider following a plant-based diet. This includes dietary choices that focus on eating mostly vegetables and fruit, grains, beans, nuts, and seeds. If you have high blood pressure, you may need to limit your sodium intake or follow a diet such as the DASH (Dietary Approaches to Stop Hypertension) eating plan. The DASH diet aims to lower high  blood pressure. Lifestyle Set weight loss goals with help from your health care team. It is recommended that most people with prediabetes lose 7% of their body weight. Exercise for at least 30 minutes 5 or more days a week. Attend a support group or seek support from a mental health counselor. Take over-the-counter and prescription medicines only as told by your health care provider. What foods are recommended? Fruits Berries. Bananas. Apples. Oranges. Grapes. Papaya. Mango. Pomegranate. Kiwi. Grapefruit. Cherries. Vegetables Lettuce. Spinach. Peas. Beets. Cauliflower. Cabbage. Broccoli. Carrots. Tomatoes. Squash. Eggplant. Herbs. Peppers. Onions. Cucumbers. Brussels sprouts. Grains Whole grains, such as whole-wheat or whole-grain breads, crackers, cereals, and pasta. Unsweetened oatmeal. Bulgur. Barley. Quinoa. Brown rice. Corn or whole-wheat flour tortillas or taco shells. Meats and other proteins Seafood. Poultry without skin. Lean cuts of pork and beef. Tofu. Eggs. Nuts. Beans. Dairy Low-fat or fat-free dairy products, such as yogurt, cottage cheese, and cheese. Beverages Water. Tea. Coffee. Sugar-free or diet soda. Seltzer water. Low-fat or nonfat milk. Milk alternatives, such as soy or almond milk. Fats and oils Olive oil. Canola oil. Sunflower oil. Grapeseed oil. Avocado. Walnuts. Sweets and desserts Sugar-free or low-fat pudding. Sugar-free or low-fat ice cream and other frozen treats. Seasonings and condiments Herbs. Sodium-free spices. Mustard. Relish. Low-salt, low-sugar ketchup. Low-salt, low-sugar barbecue sauce. Low-fat or fat-free mayonnaise. The items listed above may not be a complete list of recommended foods and beverages. Contact a dietitian for more information. What foods are not recommended? Fruits Fruits canned with syrup. Vegetables Canned vegetables. Frozen vegetables with butter or cream sauce. Grains Refined white flour and flour products, such as bread,  pasta, snack foods, and cereals. Meats and other proteins Fatty cuts of meat. Poultry with skin. Breaded or fried meat. Processed meats. Dairy Full-fat yogurt, cheese, or milk. Beverages Sweetened drinks, such as iced tea and soda. Fats and oils Butter. Lard. Ghee. Sweets and desserts Baked goods, such as cake, cupcakes, pastries, cookies, and cheesecake. Seasonings and condiments Spice mixes with added salt. Ketchup. Barbecue sauce. Mayonnaise. The items listed above may not be a complete list of foods and beverages that are not recommended. Contact a dietitian for more information. Where to find more information American Diabetes Association: www.diabetes.org Summary You may need to make diet and lifestyle changes to help prevent the onset of diabetes. These changes can help you control blood sugar, improve cholesterol levels, and manage blood pressure. Set weight loss goals with help from your health care team. It is recommended that most people with prediabetes lose 7% of their body weight. Consider following a Mediterranean diet. This includes eating plenty of fresh fruits and vegetables, whole grains, beans, nuts, seeds, fish, and low-fat dairy, and using olive oil instead of other fats. This information is not intended to replace advice given to you by your health care provider. Make sure you discuss any questions you have with your health care provider. Document Revised: 05/20/2019 Document Reviewed: 05/20/2019 Elsevier Patient Education  [place].

## 2021-07-27 LAB — COMPREHENSIVE METABOLIC PANEL
ALT: 18 IU/L (ref 0–32)
AST: 23 IU/L (ref 0–40)
Albumin/Globulin Ratio: 1.7 (ref 1.2–2.2)
Albumin: 4 g/dL (ref 3.8–4.8)
Alkaline Phosphatase: 96 IU/L (ref 44–121)
BUN/Creatinine Ratio: 24 (ref 12–28)
BUN: 20 mg/dL (ref 8–27)
Bilirubin Total: 0.3 mg/dL (ref 0.0–1.2)
CO2: 24 mmol/L (ref 20–29)
Calcium: 9.5 mg/dL (ref 8.7–10.3)
Chloride: 106 mmol/L (ref 96–106)
Creatinine, Ser: 0.84 mg/dL (ref 0.57–1.00)
Globulin, Total: 2.4 g/dL (ref 1.5–4.5)
Glucose: 111 mg/dL — ABNORMAL HIGH (ref 70–99)
Potassium: 5.2 mmol/L (ref 3.5–5.2)
Sodium: 143 mmol/L (ref 134–144)
Total Protein: 6.4 g/dL (ref 6.0–8.5)
eGFR: 78 mL/min/{1.73_m2} (ref >59)

## 2021-07-27 LAB — CBC WITH DIFFERENTIAL/PLATELET
Basophils Absolute: 0.1 10*3/uL (ref 0.0–0.2)
Basos: 1 %
EOS (ABSOLUTE): 0.5 10*3/uL — ABNORMAL HIGH (ref 0.0–0.4)
Eos: 6 %
Hematocrit: 37.9 % (ref 34.0–46.6)
Hemoglobin: 12.4 g/dL (ref 11.1–15.9)
Immature Grans (Abs): 0 10*3/uL (ref 0.0–0.1)
Immature Granulocytes: 0 %
Lymphocytes Absolute: 3.2 10*3/uL — ABNORMAL HIGH (ref 0.7–3.1)
Lymphs: 41 %
MCH: 29.7 pg (ref 26.6–33.0)
MCHC: 32.7 g/dL (ref 31.5–35.7)
MCV: 91 fL (ref 79–97)
Monocytes Absolute: 0.6 10*3/uL (ref 0.1–0.9)
Monocytes: 8 %
Neutrophils Absolute: 3.4 10*3/uL (ref 1.4–7.0)
Neutrophils: 44 %
Platelets: 370 10*3/uL (ref 150–450)
RBC: 4.18 x10E6/uL (ref 3.77–5.28)
RDW: 13.1 % (ref 11.7–15.4)
WBC: 7.7 10*3/uL (ref 3.4–10.8)

## 2021-07-27 LAB — LIPID PANEL
Chol/HDL Ratio: 4 ratio (ref 0.0–4.4)
Cholesterol, Total: 178 mg/dL (ref 100–199)
HDL: 44 mg/dL (ref >39)
LDL Chol Calc (NIH): 105 mg/dL — ABNORMAL HIGH (ref 0–99)
Triglycerides: 163 mg/dL — ABNORMAL HIGH (ref 0–149)
VLDL Cholesterol Cal: 29 mg/dL (ref 5–40)

## 2021-07-27 LAB — HEMOGLOBIN A1C
Est. average glucose Bld gHb Est-mCnc: 123 mg/dL
Hgb A1c MFr Bld: 5.9 % — ABNORMAL HIGH (ref 4.8–5.6)

## 2021-07-27 LAB — CARDIOVASCULAR RISK ASSESSMENT

## 2021-07-27 LAB — TSH: TSH: 1.74 u[IU]/mL (ref 0.450–4.500)

## 2021-08-01 DIAGNOSIS — Z96642 Presence of left artificial hip joint: Secondary | ICD-10-CM | POA: Diagnosis not present

## 2021-08-01 DIAGNOSIS — R2689 Other abnormalities of gait and mobility: Secondary | ICD-10-CM | POA: Diagnosis not present

## 2021-08-01 DIAGNOSIS — M25551 Pain in right hip: Secondary | ICD-10-CM | POA: Diagnosis not present

## 2021-08-08 DIAGNOSIS — Z1231 Encounter for screening mammogram for malignant neoplasm of breast: Secondary | ICD-10-CM | POA: Diagnosis not present

## 2021-08-09 ENCOUNTER — Other Ambulatory Visit: Payer: Self-pay

## 2021-08-09 DIAGNOSIS — Z1231 Encounter for screening mammogram for malignant neoplasm of breast: Secondary | ICD-10-CM

## 2021-08-15 DIAGNOSIS — M1611 Unilateral primary osteoarthritis, right hip: Secondary | ICD-10-CM | POA: Diagnosis not present

## 2021-09-02 ENCOUNTER — Other Ambulatory Visit: Payer: Self-pay | Admitting: Family Medicine

## 2021-09-02 DIAGNOSIS — E038 Other specified hypothyroidism: Secondary | ICD-10-CM

## 2021-09-02 MED ORDER — LEVOTHYROXINE SODIUM 150 MCG PO TABS: 150.0000 ug | ORAL_TABLET | Freq: Every day | ORAL | 0 refills | Status: DC

## 2021-09-18 ENCOUNTER — Telehealth: Payer: Self-pay | Admitting: Family Medicine

## 2021-09-18 NOTE — Telephone Encounter (Signed)
PROVIDER OUT OF OFFICE, LEFT VOICEMAIL TO [place].

## 2021-10-15 ENCOUNTER — Other Ambulatory Visit: Payer: Self-pay | Admitting: Physician Assistant

## 2021-10-15 ENCOUNTER — Other Ambulatory Visit: Payer: Self-pay | Admitting: Legal Medicine

## 2021-10-18 ENCOUNTER — Other Ambulatory Visit: Payer: Self-pay | Admitting: Family Medicine

## 2021-10-29 ENCOUNTER — Telehealth: Payer: Self-pay | Admitting: *Deleted

## 2021-10-29 ENCOUNTER — Ambulatory Visit: Payer: Medicare HMO | Admitting: Nurse Practitioner

## 2021-10-29 NOTE — Patient Outreach (Signed)
  Care Coordination   10/29/2021 Name: Caitlin Ho MRN: [id number] DOB: 07-20-1955   Care Coordination Outreach Attempts:  An unsuccessful telephone outreach was attempted today to offer the patient information about available care coordination services as a benefit of their health plan.   Follow Up Plan:  Additional outreach attempts will be made to offer the patient care coordination information and services.   Encounter Outcome:  No Answer  Care Coordination Interventions Activated:  No   Care Coordination Interventions:  No, not indicated    Eduard Clos MSW, LCSW Licensed Clinical Social Worker      [phone]

## 2021-11-01 ENCOUNTER — Telehealth: Payer: Self-pay

## 2021-11-01 NOTE — Patient Outreach (Signed)
  Care Coordination   11/01/2021 Name: DAYAMI TAITT MRN: [id number] DOB: August 20, 1955   Care Coordination Outreach Attempts:  A second unsuccessful outreach was attempted today to offer the patient with information about available care coordination services as a benefit of their health plan.     Follow Up Plan:  Additional outreach attempts will be made to offer the patient care coordination information and services.   Encounter Outcome:  No Answer  Care Coordination Interventions Activated:  No   Care Coordination Interventions:  No, not indicated     Tomasa Rand, RN, BSN, CEN [facility] [employer] [phone]

## 2021-11-06 ENCOUNTER — Other Ambulatory Visit: Payer: Self-pay | Admitting: Family Medicine

## 2021-11-07 ENCOUNTER — Encounter: Payer: Self-pay | Admitting: Nurse Practitioner

## 2021-11-07 ENCOUNTER — Ambulatory Visit (INDEPENDENT_AMBULATORY_CARE_PROVIDER_SITE_OTHER): Payer: Medicare HMO | Admitting: Nurse Practitioner

## 2021-11-07 VITALS — BP 122/84 | HR 71 | Temp 97.1°F | Ht 65.0 in | Wt 212.0 lb

## 2021-11-07 DIAGNOSIS — Z5320 Procedure and treatment not carried out because of patient's decision for unspecified reasons: Secondary | ICD-10-CM | POA: Diagnosis not present

## 2021-11-07 DIAGNOSIS — M797 Fibromyalgia: Secondary | ICD-10-CM | POA: Diagnosis not present

## 2021-11-07 DIAGNOSIS — K219 Gastro-esophageal reflux disease without esophagitis: Secondary | ICD-10-CM | POA: Diagnosis not present

## 2021-11-07 DIAGNOSIS — E038 Other specified hypothyroidism: Secondary | ICD-10-CM | POA: Diagnosis not present

## 2021-11-07 DIAGNOSIS — I10 Essential (primary) hypertension: Secondary | ICD-10-CM

## 2021-11-07 DIAGNOSIS — R7303 Prediabetes: Secondary | ICD-10-CM | POA: Diagnosis not present

## 2021-11-07 DIAGNOSIS — E782 Mixed hyperlipidemia: Secondary | ICD-10-CM

## 2021-11-07 NOTE — Progress Notes (Signed)
Established Patient Office Visit  Subjective   Patient ID: Caitlin Ho, female    DOB: Aug 30, 1955  Age: 66 y.o. MRN: [id number]  Chief Complaint  Patient presents with   Hypertension    HPI: Pt presents today for follow-up of HTN, prediabetes, hyperlipidemia, and GERD. States she is experiencing back pain. States she has an upcoming appt with neurosurgeon in Oct 2023. Currently prescribed Tramadol TID PRN for chronic pain secondary to fibromyalgia. Underwent right THA surgery in May 2023.    Prediabetes, Follow-up  Lab Results  Component Value Date   HGBA1C 5.9 (H) 07/26/2021   HGBA1C 6.0 (H) 06/20/2021   HGBA1C 6.1 (H) 04/26/2021   GLUCOSE 111 (H) 07/26/2021   GLUCOSE 100 (H) 06/20/2021   GLUCOSE 117 (H) 06/13/2021    Last seen for for this3 months ago.  Management since that visit includes diet modification. Current symptoms include none and have been stable. Prior visit with dietician: no Current diet: in general, a "healthy" diet   Current exercise: none  Pertinent Labs:    Component Value Date/Time   CHOL 178 07/26/2021 0828   TRIG 163 (H) 07/26/2021 0828   CHOLHDL 4.0 07/26/2021 0828   CREATININE 0.84 07/26/2021 0828    Wt Readings from Last 3 Encounters:  11/07/21 212 lb (96.2 kg)  07/26/21 209 lb (94.8 kg)  06/20/21 205 lb 7 oz (93.2 kg)     Lipid/Cholesterol, Follow-up  Last lipid panel Other pertinent labs  Lab Results  Component Value Date   CHOL 178 07/26/2021   HDL 44 07/26/2021   LDLCALC 105 (H) 07/26/2021   TRIG 163 (H) 07/26/2021   CHOLHDL 4.0 07/26/2021   Lab Results  Component Value Date   ALT 18 07/26/2021   AST 23 07/26/2021   PLT 370 07/26/2021   TSH 1.740 07/26/2021     She was last seen for this 3 months ago.  Management includes heart healthy diet. She reports good compliance with treatment. She is not having side effects.  Current diet: in general, a "healthy" diet   Current exercise: none     GERD, Follow  up:  The patient was last seen for GERD 3 months ago. Current treatment consist AS:[id number] 40 mg QD She reports good compliance with treatment. She is not having side effects. . She is NOT experiencing belching and eructation, cough, or difficulty swallowing   Review of Systems  Musculoskeletal:  Positive for back pain.  All other systems reviewed and are negative.     Objective:     BP 122/84 (BP Location: Left Arm, Patient Position: Sitting, Cuff Size: Large)   Pulse 71   Temp (!) 97.1 F (36.2 C) (Temporal)   Ht '5\' 5"'$  (1.651 m)   Wt 212 lb (96.2 kg)   LMP  (LMP Unknown)   SpO2 95%   BMI 35.28 kg/m    Physical Exam Vitals reviewed.  HENT:     Right Ear: Tympanic membrane normal.     Left Ear: Tympanic membrane normal.     Mouth/Throat:     Mouth: Mucous membranes are moist.  Eyes:     Pupils: Pupils are equal, round, and reactive to light.  Cardiovascular:     Rate and Rhythm: Normal rate and regular rhythm.     Pulses: Normal pulses.     Heart sounds: Normal heart sounds.  Pulmonary:     Effort: Pulmonary effort is normal.     Breath sounds: Normal breath sounds.  Abdominal:  General: Bowel sounds are normal.     Palpations: Abdomen is soft.  Skin:    Capillary Refill: Capillary refill takes less than 2 seconds.  Neurological:     General: No focal deficit present.     Mental Status: She is alert.  Psychiatric:        Mood and Affect: Mood normal.        Behavior: Behavior normal.      Assessment & Plan:    1. Prediabetes-stable - CBC with Differential/Platelet - Comprehensive metabolic panel - Hemoglobin A1c  2. Gastroesophageal reflux disease, unspecified whether esophagitis present-well controlled - CBC with Differential/Platelet - Comprehensive metabolic panel -continue Prilosec 40 mg QD -avoid foods that trigger GERD  3. Mixed hyperlipidemia-not at goal - CBC with Differential/Platelet - Comprehensive metabolic panel - Lipid  panel -continue heart healthy diet  4. Secondary hypothyroidism-well controlled - T4, free - TSH -continue Synthroid 150 mcg QD, pending labs  5. Fibromyalgia-well controlled  -continue Tramadol as prescribed  6. Colonoscopy declined   We will call you with lab results Continue medications Follow-up in 62-month, fasting  Follow-up: 363-month fasting  I, ShRip HarbourNP, have reviewed all documentation for this visit. The documentation on 11/07/21 for the exam, diagnosis, procedures, and orders are all accurate and complete.   Signed, ShRip HarbourNP

## 2021-11-07 NOTE — Patient Instructions (Signed)
We will call you with lab results Continue medications Follow-up in 23-months, fasting   Bone Health Information for Aging Adults Your bones do more than support your body. They also store calcium. The inside of your bones (marrow) makes blood cells. Maintaining bone health becomes more important as you age because your bones replace all their cells about every 10 years. Around age 64, it gets harder to replace those cells, and bones can become weak. Weak bones can lead to osteoporosis and breaks (fractures). Falls also become more likely, which can cause fractures. The good news is that, with diet and exercise, you can improve and maintain your bone health at any age. How to eat for bone health A balanced diet can supply many of the vitamins, minerals, and proteins you need for bone health. Older adults need to make sure they get enough calcium, vitamin D, and magnesium. You may need more of these as you age. Calcium  Calcium is the most important (essential) mineral for bone health. The daily requirement for calcium for adult men aged 60-70 years is 1,000 mg. For adult women aged 33-70 years, it is 1,200 mg. At age 18 or older, it is 1,200 mg for both men and women. Sources of calcium in your diet include: Dairy foods like milk, yogurt, and cheese. Dairy foods are the best sources. If you cannot eat dairy, you may need a calcium supplement. Leafy, dark green vegetables. These include collard greens, kale, broccoli, bok choy, and okra. Fatty fish, like sardines and canned salmon with bones. Almonds. Tofu.  Vitamin D You need vitamin D for bone health because it helps your body absorb calcium from your diet. It is not found naturally in many foods. Many people can benefit from taking a supplement. The daily requirement for vitamin D at age 53 or older is 600-1,000 international units (IU). Dietary sources for vitamin D include: Fatty fish, such as swordfish, salmon, sardine, and mackerel. Foods  that have vitamin D added to them (are fortified), like cereal and dairy products. Egg yolks. Magnesium Magnesium helps your body use both calcium and vitamin D. The recommended daily intake for adult men is 400-420 mg. For adult women, it is 310-320 mg. Dietary sources of magnesium include: Green vegetables, such as collard greens, kale, bok choy, and okra. Poppy, sesame, and chia seeds. Legumes, including peas and beans. Whole grains. Avocados. Nuts. If you drink alcohol regularly or take a type of antacid called a proton pump inhibitor, you might benefit from a magnesium supplement. How to exercise for bone health Exercise is important for bone health because it strengthens bones and muscles. Bones are living organs that get stronger when you exercise them, just like your heart and other muscles. Muscles support your bones and protect your joints. Strong muscles also help prevent bone loss and falls. Weight-bearing exercises and resistance exercises are the two types of exercise that are most important for bone health. Weight-bearing exercises include running or walking, climbing stairs, and playing sports like tennis. Resistance exercises include those done using free weights, weight machines, or resistance bands. Try to get at least 30 minutes of exercise every day. You can also include stretching, balance, and flexibility exercises, like yoga or tai chi. These lower your risk of falls. Follow these instructions at home Ask your health care provider if: You need a bone density test. This is especially important for women older than 5 and men older than 70. You have been losing height. Loss of height  may be a sign of weakening bones in your spine. Any of your medicines or medical conditions could affect your bone health. He or she could recommend an exercise program that is safe for you. Be sure it includes both weight-bearing and resistance exercises. Do not use any products that contain  nicotine or tobacco. These products include cigarettes, chewing tobacco, and vaping devices, such as e-cigarettes. These can reduce bone density. If you need help quitting, ask your health care provider. Drink alcohol in moderation. Alcohol reduces bone density and increases your risk for falls. If you drink alcohol: Limit how much you have to: 0-1 drink a day for women who are not pregnant. 0-2 drinks a day for men. Know how much alcohol is in a drink. In the [place] one drink equals one 12 oz bottle of beer (355 mL), one 5 oz glass of wine (148 mL), or one 1 oz glass of hard liquor (44 mL). Take over-the-counter and prescription medicines only as told by your health care provider. Ask your health care provider if you could benefit from taking calcium, vitamin D, or magnesium supplements. Keep all follow-up visits. This is important. Where to find more information American Bone Health: CardKnowledge.fi American Academy of Orthopaedic Surgeons: orthoinfo.[place]: bones.SouthExposed.es Summary Maintaining your bone health becomes more important as you age. You can improve and maintain your bone health with diet and exercise at any age. A balanced diet can supply many of the vitamins, minerals, and proteins you need for bone health. Older adults need to make sure they get enough calcium, vitamin D, and magnesium. Ask your health care provider if you could benefit from taking calcium, vitamin D, or magnesium supplements. Exercise is important for bone health because it strengthens bones and muscles. Do both weight-bearing and resistance exercises. This information is not intended to replace advice given to you by your health care provider. Make sure you discuss any questions you have with your health care provider. Document Revised: 08/02/2020 Document Reviewed: 08/02/2020 Elsevier Patient Education  [place].

## 2021-11-08 LAB — CBC WITH DIFFERENTIAL/PLATELET
Basophils Absolute: 0.1 10*3/uL (ref 0.0–0.2)
Basos: 2 %
EOS (ABSOLUTE): 0.4 10*3/uL (ref 0.0–0.4)
Eos: 5 %
Hematocrit: 44.3 % (ref 34.0–46.6)
Hemoglobin: 14.4 g/dL (ref 11.1–15.9)
Immature Grans (Abs): 0.1 10*3/uL (ref 0.0–0.1)
Immature Granulocytes: 1 %
Lymphocytes Absolute: 3.7 10*3/uL — ABNORMAL HIGH (ref 0.7–3.1)
Lymphs: 41 %
MCH: 28.6 pg (ref 26.6–33.0)
MCHC: 32.5 g/dL (ref 31.5–35.7)
MCV: 88 fL (ref 79–97)
Monocytes Absolute: 0.7 10*3/uL (ref 0.1–0.9)
Monocytes: 8 %
Neutrophils Absolute: 3.7 10*3/uL (ref 1.4–7.0)
Neutrophils: 43 %
Platelets: 327 10*3/uL (ref 150–450)
RBC: 5.03 x10E6/uL (ref 3.77–5.28)
RDW: 11.9 % (ref 11.7–15.4)
WBC: 8.7 10*3/uL (ref 3.4–10.8)

## 2021-11-08 LAB — LIPID PANEL
Chol/HDL Ratio: 3.7 ratio (ref 0.0–4.4)
Cholesterol, Total: 157 mg/dL (ref 100–199)
HDL: 43 mg/dL (ref >39)
LDL Chol Calc (NIH): 89 mg/dL (ref 0–99)
Triglycerides: 141 mg/dL (ref 0–149)
VLDL Cholesterol Cal: 25 mg/dL (ref 5–40)

## 2021-11-08 LAB — COMPREHENSIVE METABOLIC PANEL
ALT: 20 IU/L (ref 0–32)
AST: 23 IU/L (ref 0–40)
Albumin/Globulin Ratio: 1.9 (ref 1.2–2.2)
Albumin: 4.1 g/dL (ref 3.9–4.9)
Alkaline Phosphatase: 68 IU/L (ref 44–121)
BUN/Creatinine Ratio: 20 (ref 12–28)
BUN: 18 mg/dL (ref 8–27)
Bilirubin Total: 0.2 mg/dL (ref 0.0–1.2)
CO2: 24 mmol/L (ref 20–29)
Calcium: 9.5 mg/dL (ref 8.7–10.3)
Chloride: 102 mmol/L (ref 96–106)
Creatinine, Ser: 0.89 mg/dL (ref 0.57–1.00)
Globulin, Total: 2.2 g/dL (ref 1.5–4.5)
Glucose: 106 mg/dL — ABNORMAL HIGH (ref 70–99)
Potassium: 4.8 mmol/L (ref 3.5–5.2)
Sodium: 141 mmol/L (ref 134–144)
Total Protein: 6.3 g/dL (ref 6.0–8.5)
eGFR: 72 mL/min/{1.73_m2} (ref >59)

## 2021-11-08 LAB — TSH: TSH: 0.369 u[IU]/mL — ABNORMAL LOW (ref 0.450–4.500)

## 2021-11-08 LAB — HEMOGLOBIN A1C
Est. average glucose Bld gHb Est-mCnc: 134 mg/dL
Hgb A1c MFr Bld: 6.3 % — ABNORMAL HIGH (ref 4.8–5.6)

## 2021-11-08 LAB — T4, FREE: Free T4: 2.3 ng/dL — ABNORMAL HIGH (ref 0.82–1.77)

## 2021-11-08 LAB — CARDIOVASCULAR RISK ASSESSMENT

## 2021-11-09 ENCOUNTER — Other Ambulatory Visit: Payer: Self-pay

## 2021-11-09 DIAGNOSIS — E038 Other specified hypothyroidism: Secondary | ICD-10-CM

## 2021-11-09 MED ORDER — LEVOTHYROXINE SODIUM 125 MCG PO TABS: 125.0000 ug | ORAL_TABLET | Freq: Every day | ORAL | 1 refills | Status: DC

## 2021-11-17 ENCOUNTER — Other Ambulatory Visit: Payer: Self-pay | Admitting: Physician Assistant

## 2021-11-19 DIAGNOSIS — H35013 Changes in retinal vascular appearance, bilateral: Secondary | ICD-10-CM | POA: Diagnosis not present

## 2021-11-19 DIAGNOSIS — H5203 Hypermetropia, bilateral: Secondary | ICD-10-CM | POA: Diagnosis not present

## 2021-11-19 DIAGNOSIS — H2513 Age-related nuclear cataract, bilateral: Secondary | ICD-10-CM | POA: Diagnosis not present

## 2021-11-19 DIAGNOSIS — H524 Presbyopia: Secondary | ICD-10-CM | POA: Diagnosis not present

## 2021-11-19 DIAGNOSIS — E119 Type 2 diabetes mellitus without complications: Secondary | ICD-10-CM | POA: Diagnosis not present

## 2021-11-19 DIAGNOSIS — H53453 Other localized visual field defect, bilateral: Secondary | ICD-10-CM | POA: Diagnosis not present

## 2021-11-19 DIAGNOSIS — H16143 Punctate keratitis, bilateral: Secondary | ICD-10-CM | POA: Diagnosis not present

## 2021-11-19 LAB — HM DIABETES EYE EXAM

## 2021-11-28 ENCOUNTER — Telehealth: Payer: Self-pay

## 2021-11-28 NOTE — Patient Outreach (Signed)
  Care Coordination   Initial Visit Note   11/28/2021 Name: KENDALYNN WIDEMAN MRN: [id number] DOB: 12/16/1955  BEVERELY SUEN is a 66 y.o. year old female who sees Cox, Kirsten, MD for primary care. I spoke with  [name] by phone today.  What matters to the patients health and wellness today?  Placed call to patient to explain and offer [facility] care coordination program. Patient reports that she is doing well. Reviewed with patient the importance of her  AWV and encouraged patient to call MD office and schedule.     SDOH assessments and interventions completed:  No     Care Coordination Interventions Activated:  No  Care Coordination Interventions:  No, not indicated   Follow up plan: No further intervention required.   Encounter Outcome:  Pt. Refused   Tomasa Rand, RN, BSN, CEN [facility] [employer] [phone]

## 2021-12-05 DIAGNOSIS — M5416 Radiculopathy, lumbar region: Secondary | ICD-10-CM | POA: Diagnosis not present

## 2021-12-05 DIAGNOSIS — M546 Pain in thoracic spine: Secondary | ICD-10-CM | POA: Diagnosis not present

## 2021-12-05 DIAGNOSIS — Z6834 Body mass index (BMI) 34.0-34.9, adult: Secondary | ICD-10-CM | POA: Diagnosis not present

## 2021-12-10 ENCOUNTER — Other Ambulatory Visit: Payer: Self-pay | Admitting: Nurse Practitioner

## 2021-12-20 DIAGNOSIS — M5115 Intervertebral disc disorders with radiculopathy, thoracolumbar region: Secondary | ICD-10-CM | POA: Diagnosis not present

## 2021-12-20 DIAGNOSIS — M5414 Radiculopathy, thoracic region: Secondary | ICD-10-CM | POA: Diagnosis not present

## 2021-12-25 ENCOUNTER — Other Ambulatory Visit: Payer: Self-pay | Admitting: Family Medicine

## 2021-12-28 ENCOUNTER — Other Ambulatory Visit: Payer: Self-pay | Admitting: Family Medicine

## 2021-12-31 ENCOUNTER — Other Ambulatory Visit: Payer: Self-pay | Admitting: Physician Assistant

## 2022-01-30 ENCOUNTER — Other Ambulatory Visit: Payer: Self-pay | Admitting: Family Medicine

## 2022-02-12 ENCOUNTER — Ambulatory Visit: Payer: Medicare HMO | Admitting: Family Medicine

## 2022-02-21 ENCOUNTER — Other Ambulatory Visit: Payer: Self-pay | Admitting: Nurse Practitioner

## 2022-02-27 ENCOUNTER — Encounter: Payer: Self-pay | Admitting: Family Medicine

## 2022-02-27 ENCOUNTER — Ambulatory Visit (INDEPENDENT_AMBULATORY_CARE_PROVIDER_SITE_OTHER): Payer: Medicare HMO | Admitting: Family Medicine

## 2022-02-27 VITALS — BP 118/68 | HR 72 | Temp 97.9°F | Ht 65.0 in | Wt 212.0 lb

## 2022-02-27 DIAGNOSIS — Z114 Encounter for screening for human immunodeficiency virus [HIV]: Secondary | ICD-10-CM | POA: Diagnosis not present

## 2022-02-27 DIAGNOSIS — I10 Essential (primary) hypertension: Secondary | ICD-10-CM

## 2022-02-27 DIAGNOSIS — E782 Mixed hyperlipidemia: Secondary | ICD-10-CM | POA: Diagnosis not present

## 2022-02-27 DIAGNOSIS — E559 Vitamin D deficiency, unspecified: Secondary | ICD-10-CM

## 2022-02-27 DIAGNOSIS — R7303 Prediabetes: Secondary | ICD-10-CM

## 2022-02-27 DIAGNOSIS — Z1159 Encounter for screening for other viral diseases: Secondary | ICD-10-CM | POA: Diagnosis not present

## 2022-02-27 DIAGNOSIS — M797 Fibromyalgia: Secondary | ICD-10-CM | POA: Diagnosis not present

## 2022-02-27 DIAGNOSIS — M545 Low back pain, unspecified: Secondary | ICD-10-CM

## 2022-02-27 DIAGNOSIS — E038 Other specified hypothyroidism: Secondary | ICD-10-CM

## 2022-02-27 DIAGNOSIS — K219 Gastro-esophageal reflux disease without esophagitis: Secondary | ICD-10-CM

## 2022-02-27 DIAGNOSIS — G8929 Other chronic pain: Secondary | ICD-10-CM

## 2022-02-27 HISTORY — DX: Gastro-esophageal reflux disease without esophagitis: K21.9

## 2022-02-27 HISTORY — DX: Vitamin D deficiency, unspecified: E55.9

## 2022-02-27 MED ORDER — MELOXICAM 15 MG PO TABS: 15.0000 mg | ORAL_TABLET | Freq: Every day | ORAL | 3 refills | Status: DC | PRN

## 2022-02-27 NOTE — Progress Notes (Signed)
Subjective:  Patient ID: Caitlin Ho, female    DOB: Jun 24, 1955  Age: 66 y.o. MRN: [id number]  Chief Complaint  Patient presents with   Hypertension    HPI Hypertension:  Metoprolol tartrate 25 mg twice daily. Furosemide 40 mg one daily. History of stroke - on aspirin 81 mg daily.  Hyperlipidemia:  Fenofibrate 160 mg daily.  Eating healthy.  Hypothyroidism:  Levothyroxine 125 mcg daily.   GERD:  Omeprazole 40 mg daily.   Chronic back pain. Sees Dr. Ellene Route. On mobic 15 mg daily, robaxin and tramadol. Returning for ESIs with Dr. Ellene Route in January 2024.    Current Outpatient Medications on File Prior to Visit  Medication Sig Dispense Refill   acetaminophen (TYLENOL) 500 MG tablet Take 2 tablets (1,000 mg total) by mouth every 6 (six) hours as needed for mild pain or moderate pain. 60 tablet 0   aspirin EC 81 MG tablet Take 1 tablet (81 mg total) by mouth daily. 30 tablet 11   cetirizine (ZYRTEC) 10 MG tablet Take 10 mg by mouth daily.     fenofibrate 160 MG tablet TAKE 1 TABLET EVERY DAY 90 tablet 1   furosemide (LASIX) 40 MG tablet TAKE 1 TABLET EVERY DAY 90 tablet 3   gabapentin (NEURONTIN) 600 MG tablet TAKE 1 TABLET THREE TIMES DAILY 270 tablet 0   glucosamine-chondroitin 500-400 MG tablet Take 1 tablet by mouth daily.     levothyroxine (SYNTHROID) 125 MCG tablet Take 1 tablet (125 mcg total) by mouth daily before breakfast. 90 tablet 1   methocarbamol (ROBAXIN) 500 MG tablet TAKE 1 TABLET THREE TIMES DAILY 270 tablet 1   metoprolol tartrate (LOPRESSOR) 25 MG tablet TAKE 1 TABLET TWICE DAILY 180 tablet 0   omeprazole (PRILOSEC) 40 MG capsule TAKE 1 CAPSULE EVERY DAY 90 capsule 0   polyethylene glycol (MIRALAX / GLYCOLAX) 17 g packet Take 17 g by mouth daily as needed for moderate constipation.     traMADol (ULTRAM) 50 MG tablet TAKE 2 TABLETS BY MOUTH IN THE MORNING, AT NOON, AND AT BEDTIME. 180 tablet 0   No current facility-administered medications on file prior to visit.    Past Medical History:  Diagnosis Date   Cancer ([place])    cervical cancer cells whwn patient was 22    Cervicalgia    Fibromyalgia    GERD (gastroesophageal reflux disease)    not on medication   Hashimoto's disease    Headache(784.0)    sinus   Homonymous hemianopia    Hyperlipidemia    Hypertension    Hypothyroid    Other sequelae of cerebral infarction    PONV (postoperative nausea and vomiting)    06/20/14 patient reported that BP dropped real low and she had N/V  and was slow to awaken when she had surgery at an outpatient center.   Pre-diabetes    Seasonal allergies    Sleep apnea    no CPAP   Stroke ([place]) 2019   Vitamin D deficiency    Past Surgical History:  Procedure Laterality Date   ABDOMINAL HYSTERECTOMY  1995   APPENDECTOMY  1978   BACK SURGERY  06/29/2020   L5-S1   CERVICAL FUSION  2017   2 levels   DILATION AND CURETTAGE OF UTERUS     LUMBAR DISC SURGERY  10/03/2012   Dr Ellene Route  2 Discectomy   LUMBAR [place] SURGERY  07/2012   LUMBAR FUSION  2015   Fusion (2 levels)   LUMBAR FUSION  2016   fusion 1 level.   LUMBAR FUSION  02/16/2013   Fusion lumbar.   THYROIDECTOMY  2006   TOTAL HIP ARTHROPLASTY Right 07/03/2021   Procedure: TOTAL HIP ARTHROPLASTY ANTERIOR APPROACH;  Surgeon: Renette Butters, MD;  Location: [facility];  Service: Orthopedics;  Laterality: Right;   TUBAL LIGATION  1990    Family History  Problem Relation Age of Onset   Aneurysm Mother        brain   Heart attack Father 48   Cancer Paternal Grandmother        Breast   Hyperlipidemia Other    Hypertension Other    Diabetes Other    Social History   Socioeconomic History   Marital status: Married    Spouse name: Not on file   Number of children: 1   Years of education: Not on file   Highest education level: Not on file  Occupational History   Occupation: Disabled  Tobacco Use   Smoking status: Never   Smokeless tobacco: Never  Vaping Use   Vaping Use: Never used  Substance  and Sexual Activity   Alcohol use: No   Drug use: No   Sexual activity: Yes    Partners: Male    Birth control/protection: None  Other Topics Concern   Not on file  Social History Narrative   Not on file   Social Determinants of Health   Financial Resource Strain: Low Risk  (02/15/2020)   Overall Financial Resource Strain (CARDIA)    Difficulty of Paying Living Expenses: Not hard at all  Food Insecurity: No Food Insecurity (02/15/2020)   Hunger Vital Sign    Worried About Running Out of Food in the Last Year: Never true    [place] in the Last Year: Never true  Transportation Needs: No Transportation Needs (02/15/2020)   PRAPARE - Hydrologist (Medical): No    Lack of Transportation (Non-Medical): No  Physical Activity: Inactive (02/15/2020)   Exercise Vital Sign    Days of Exercise per Week: 0 days    Minutes of Exercise per Session: 0 min  Stress: No Stress Concern Present (02/15/2020)   [place]    Feeling of Stress : Not at all  Social Connections: [place] (02/15/2020)   Social Connection and Isolation Panel [NHANES]    Frequency of Communication with Friends and Family: More than three times a week    Frequency of Social Gatherings with Friends and Family: Once a week    Attends Religious Services: More than 4 times per year    Active Member of [employer] or Organizations: Yes    Attends Music therapist: More than 4 times per year    Marital Status: Married    Review of Systems  Constitutional:  Negative for chills, diaphoresis, fatigue and fever.  HENT:  Negative for congestion, ear pain and sore throat.   Respiratory:  Negative for cough and shortness of breath.   Cardiovascular:  Negative for chest pain.  Gastrointestinal:  Negative for abdominal pain, constipation, diarrhea, nausea and vomiting.  Genitourinary:  Negative for dysuria,  frequency and urgency.  Musculoskeletal:  Positive for arthralgias (hands, greatest in thumbs.  mobic helps some.) and back pain. Negative for myalgias.  Neurological:  Negative for dizziness and headaches.  Psychiatric/Behavioral:  Negative for agitation, dysphoric mood and sleep disturbance. The patient is not nervous/anxious.  Objective:  BP 118/68 (BP Location: Left Arm, Patient Position: Sitting)   Pulse 72   Temp 97.9 F (36.6 C) (Temporal)   Ht '5\' 5"'$  (1.651 m)   Wt 212 lb (96.2 kg)   LMP  (LMP Unknown)   SpO2 94%   BMI 35.28 kg/m      02/27/2022    8:46 AM 11/07/2021    8:00 AM 07/26/2021    8:03 AM  BP/Weight  Systolic BP 096 283 662  Diastolic BP 68 84 78  Wt. (Lbs) 212 212 209  BMI 35.28 kg/m2 35.28 kg/m2 34.78 kg/m2    Physical Exam Vitals reviewed.  Constitutional:      Appearance: Normal appearance. She is normal weight.  Neck:     Vascular: No carotid bruit.  Cardiovascular:     Rate and Rhythm: Normal rate and regular rhythm.     Heart sounds: Normal heart sounds.  Pulmonary:     Effort: Pulmonary effort is normal.     Breath sounds: Normal breath sounds.  Abdominal:     General: Abdomen is flat. Bowel sounds are normal.     Palpations: Abdomen is soft.     Tenderness: There is no abdominal tenderness.  Neurological:     Mental Status: She is alert and oriented to person, place, and time.  Psychiatric:        Mood and Affect: Mood normal.        Behavior: Behavior normal.     Diabetic Foot Exam - Simple   No data filed      Lab Results  Component Value Date   WBC 8.7 11/07/2021   HGB 14.4 11/07/2021   HCT 44.3 11/07/2021   PLT 327 11/07/2021   GLUCOSE 106 (H) 11/07/2021   CHOL 157 11/07/2021   TRIG 141 11/07/2021   HDL 43 11/07/2021   LDLCALC 89 11/07/2021   ALT 20 11/07/2021   AST 23 11/07/2021   NA 141 11/07/2021   K 4.8 11/07/2021   CL 102 11/07/2021   CREATININE 0.89 11/07/2021   BUN 18 11/07/2021   CO2 24 11/07/2021    TSH 0.369 (L) 11/07/2021   HGBA1C 6.3 (H) 11/07/2021      Assessment & Plan:   Problem List Items Addressed This Visit       Cardiovascular and Mediastinum   Essential hypertension, benign - Primary    Well controlled.  No changes to medicines. Currently taking Metoprolol 25 mg daily. Continue to work on eating a healthy diet and exercise.  Labs drawn today.       Relevant Orders   CBC With Diff/Platelet   Comprehensive metabolic panel     Digestive   Gastroesophageal reflux disease    The current medical regimen is effective;  continue present plan and medications.         Endocrine   Secondary hypothyroidism    The current medical regimen with levothyroxine 125 mcg daily is effective; awaiting for labs results to change medication if it necessary.       Relevant Orders   TSH     Other   Mixed hyperlipidemia    Well controlled.  Continue with Fenofibrate 160 mg daily. Continue to work on eating a healthy diet and exercise.  Labs drawn today.       Relevant Orders   Lipid panel   Prediabetes    Recommend continue to work on eating healthy diet and exercise.       Relevant Orders  Hemoglobin A1c   Microalbumin / creatinine urine ratio   Chronic low back pain    The current medical regimen is effective;  continue present plan and medications. Management per specialist.        Relevant Medications   meloxicam (MOBIC) 15 MG tablet   Fibromyalgia    The current medical regimen is effective;  continue present plan and medications.       Relevant Medications   meloxicam (MOBIC) 15 MG tablet   Vitamin D insufficiency   Relevant Orders   VITAMIN D 25 Hydroxy (Vit-D Deficiency, Fractures)   Other Visit Diagnoses     Encounter for screening for HIV       Relevant Orders   HIV Antibody (routine testing w rflx)   Need for hepatitis C screening test       Relevant Orders   HCV Ab w Reflex to Quant PCR     .  Meds ordered this encounter   Medications   meloxicam (MOBIC) 15 MG tablet    Sig: Take 1 tablet (15 mg total) by mouth daily as needed for pain (and inflammation).    Dispense:  90 tablet    Refill:  3    Please cancel all previous rxs for meloxicam.    Orders Placed This Encounter  Procedures   CBC With Diff/Platelet   Comprehensive metabolic panel   Lipid panel   Hemoglobin A1c   TSH   HCV Ab w Reflex to Quant PCR   HIV Antibody (routine testing w rflx)   VITAMIN D 25 Hydroxy (Vit-D Deficiency, Fractures)   Microalbumin / creatinine urine ratio   [facility] DIABETES EYE EXAM     Follow-up: No follow-ups on file.  An After Visit Summary was printed and given to the patient.    I,Lauren M Auman,acting as a scribe for Rochel Brome, MD.,have documented all relevant documentation on the behalf of Rochel Brome, MD,as directed by  Rochel Brome, MD while in the presence of Rochel Brome, MD.   Rochel Brome, MD [place] [phone]

## 2022-02-27 NOTE — Assessment & Plan Note (Signed)
The current medical regimen is effective;  continue present plan and medications.  

## 2022-02-27 NOTE — Assessment & Plan Note (Signed)
The current medical regimen is effective;  continue present plan and medications. Management per specialist.   

## 2022-02-27 NOTE — Assessment & Plan Note (Signed)
Recommend continue to work on eating healthy diet and exercise.  

## 2022-02-27 NOTE — Assessment & Plan Note (Signed)
Well controlled.  Continue with Fenofibrate 160 mg daily. Continue to work on eating a healthy diet and exercise.  Labs drawn today.

## 2022-02-27 NOTE — Assessment & Plan Note (Signed)
The current medical regimen with levothyroxine 125 mcg daily is effective; awaiting for labs results to change medication if it necessary.

## 2022-02-27 NOTE — Assessment & Plan Note (Signed)
Well controlled.  No changes to medicines. Currently taking Metoprolol 25 mg daily. Continue to work on eating a healthy diet and exercise.  Labs drawn today.

## 2022-02-28 ENCOUNTER — Other Ambulatory Visit: Payer: Self-pay | Admitting: Family Medicine

## 2022-02-28 LAB — COMPREHENSIVE METABOLIC PANEL
ALT: 15 IU/L (ref 0–32)
AST: 18 IU/L (ref 0–40)
Albumin/Globulin Ratio: 2 (ref 1.2–2.2)
Albumin: 4.3 g/dL (ref 3.9–4.9)
Alkaline Phosphatase: 55 IU/L (ref 44–121)
BUN/Creatinine Ratio: 18 (ref 12–28)
BUN: 15 mg/dL (ref 8–27)
Bilirubin Total: 0.4 mg/dL (ref 0.0–1.2)
CO2: 24 mmol/L (ref 20–29)
Calcium: 9.7 mg/dL (ref 8.7–10.3)
Chloride: 104 mmol/L (ref 96–106)
Creatinine, Ser: 0.83 mg/dL (ref 0.57–1.00)
Globulin, Total: 2.2 g/dL (ref 1.5–4.5)
Glucose: 108 mg/dL — ABNORMAL HIGH (ref 70–99)
Potassium: 4.7 mmol/L (ref 3.5–5.2)
Sodium: 144 mmol/L (ref 134–144)
Total Protein: 6.5 g/dL (ref 6.0–8.5)
eGFR: 79 mL/min/{1.73_m2} (ref >59)

## 2022-02-28 LAB — LIPID PANEL
Chol/HDL Ratio: 3.9 ratio (ref 0.0–4.4)
Cholesterol, Total: 185 mg/dL (ref 100–199)
HDL: 47 mg/dL (ref >39)
LDL Chol Calc (NIH): 113 mg/dL — ABNORMAL HIGH (ref 0–99)
Triglycerides: 139 mg/dL (ref 0–149)
VLDL Cholesterol Cal: 25 mg/dL (ref 5–40)

## 2022-02-28 LAB — CBC WITH DIFF/PLATELET
Basophils Absolute: 0.2 10*3/uL (ref 0.0–0.2)
Basos: 2 %
EOS (ABSOLUTE): 0.4 10*3/uL (ref 0.0–0.4)
Eos: 4 %
Hematocrit: 42.1 % (ref 34.0–46.6)
Hemoglobin: 13.7 g/dL (ref 11.1–15.9)
Immature Grans (Abs): 0.1 10*3/uL (ref 0.0–0.1)
Immature Granulocytes: 1 %
Lymphocytes Absolute: 3.3 10*3/uL — ABNORMAL HIGH (ref 0.7–3.1)
Lymphs: 33 %
MCH: 29.5 pg (ref 26.6–33.0)
MCHC: 32.5 g/dL (ref 31.5–35.7)
MCV: 91 fL (ref 79–97)
Monocytes Absolute: 0.7 10*3/uL (ref 0.1–0.9)
Monocytes: 7 %
Neutrophils Absolute: 5.3 10*3/uL (ref 1.4–7.0)
Neutrophils: 53 %
Platelets: 391 10*3/uL (ref 150–450)
RBC: 4.65 x10E6/uL (ref 3.77–5.28)
RDW: 13.1 % (ref 11.7–15.4)
WBC: 9.9 10*3/uL (ref 3.4–10.8)

## 2022-02-28 LAB — TSH: TSH: 3.73 u[IU]/mL (ref 0.450–4.500)

## 2022-02-28 LAB — MICROALBUMIN / CREATININE URINE RATIO
Creatinine, Urine: 198.4 mg/dL
Microalb/Creat Ratio: 4 mg/g creat (ref 0–29)
Microalbumin, Urine: 7.3 ug/mL

## 2022-02-28 LAB — HCV INTERPRETATION

## 2022-02-28 LAB — HCV AB W REFLEX TO QUANT PCR: HCV Ab: NONREACTIVE

## 2022-02-28 LAB — CARDIOVASCULAR RISK ASSESSMENT

## 2022-02-28 LAB — VITAMIN D 25 HYDROXY (VIT D DEFICIENCY, FRACTURES): Vit D, 25-Hydroxy: 23 ng/mL — ABNORMAL LOW (ref 30.0–100.0)

## 2022-02-28 LAB — HIV ANTIBODY (ROUTINE TESTING W REFLEX): HIV Screen 4th Generation wRfx: NONREACTIVE

## 2022-02-28 LAB — HEMOGLOBIN A1C
Est. average glucose Bld gHb Est-mCnc: 128 mg/dL
Hgb A1c MFr Bld: 6.1 % — ABNORMAL HIGH (ref 4.8–5.6)

## 2022-02-28 MED ORDER — VITAMIN D (ERGOCALCIFEROL) 1.25 MG (50000 UNIT) PO CAPS: 50000.0000 [IU] | ORAL_CAPSULE | ORAL | 1 refills | Status: DC

## 2022-03-13 DIAGNOSIS — M546 Pain in thoracic spine: Secondary | ICD-10-CM | POA: Diagnosis not present

## 2022-03-13 DIAGNOSIS — Z6834 Body mass index (BMI) 34.0-34.9, adult: Secondary | ICD-10-CM | POA: Diagnosis not present

## 2022-03-17 ENCOUNTER — Other Ambulatory Visit: Payer: Self-pay | Admitting: Family Medicine

## 2022-03-17 IMAGING — RF DG C-ARM 1-60 MIN
1 series · 2 of 2 positions shown · non-contrast
Comparison: Lumbar spine MRI 05/31/2020.

CLINICAL DATA: Surgery, elective. Additional history provided:
Lumbar 5-sacral 1 posterior lumbar interbody fusion with fixation to
pelvis. Provided fluoroscopy time 18.5 seconds (14.134 mGy).

EXAM:
LUMBAR SPINE - 2-3 VIEW; DG C-ARM 1-60 MIN

[Series 1: unknown protocol · 0.14mm/px · 2 of 2 slices shown]
[im 1/2]
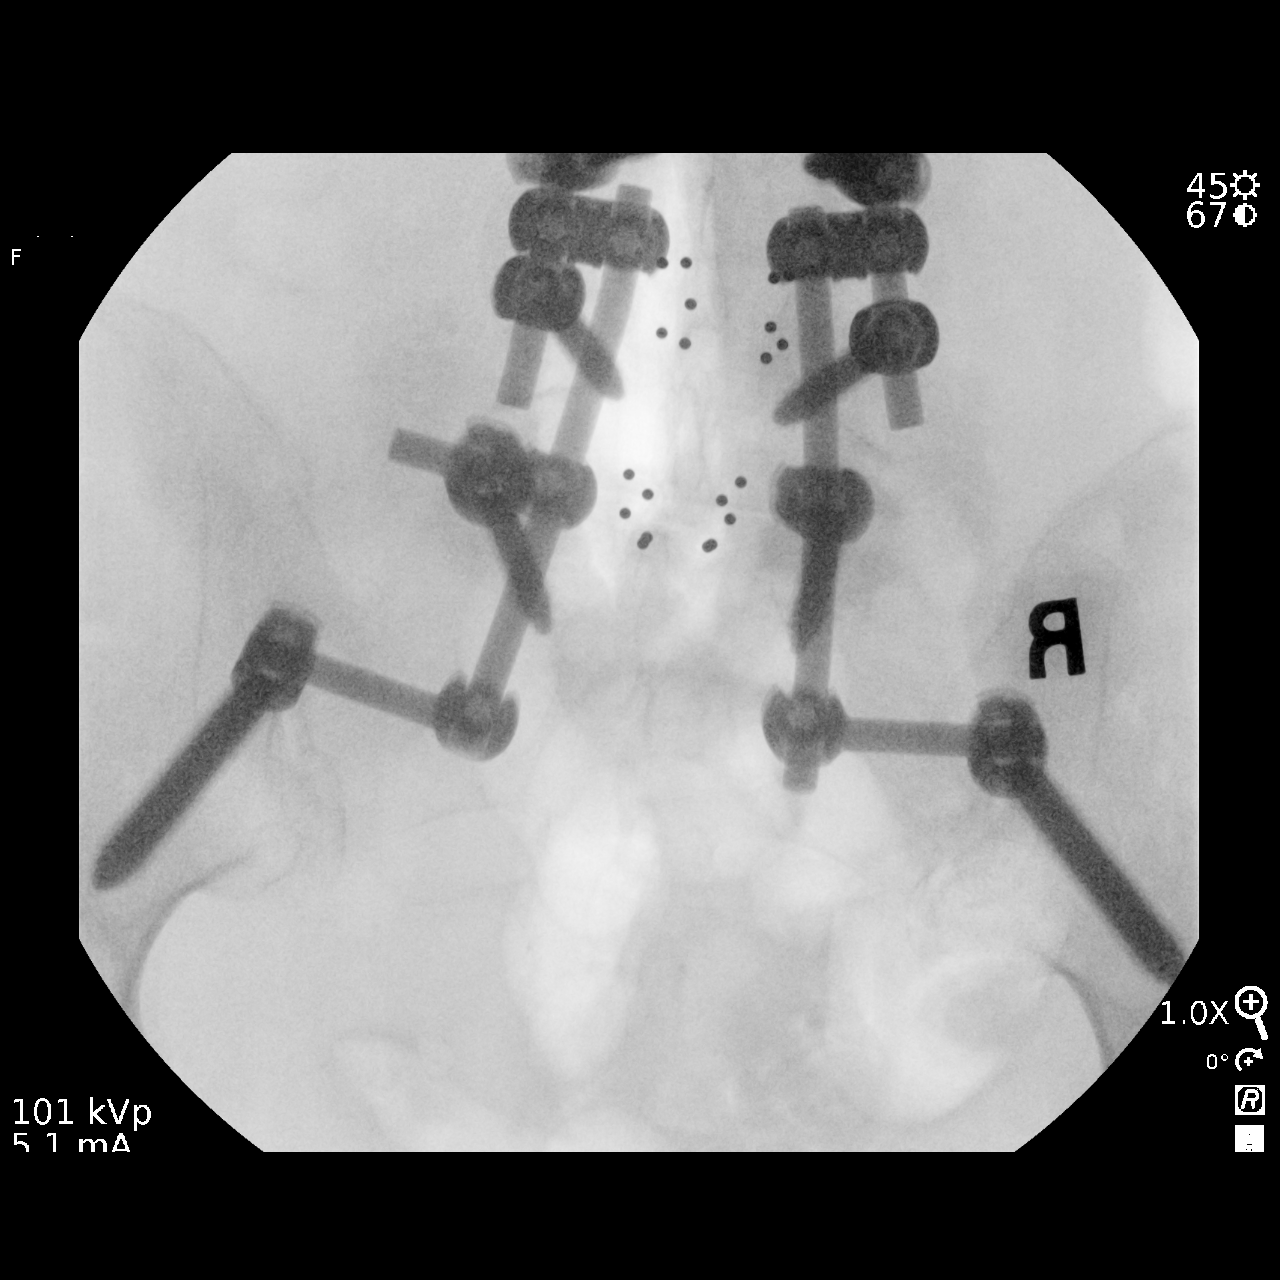
[im 2/2]
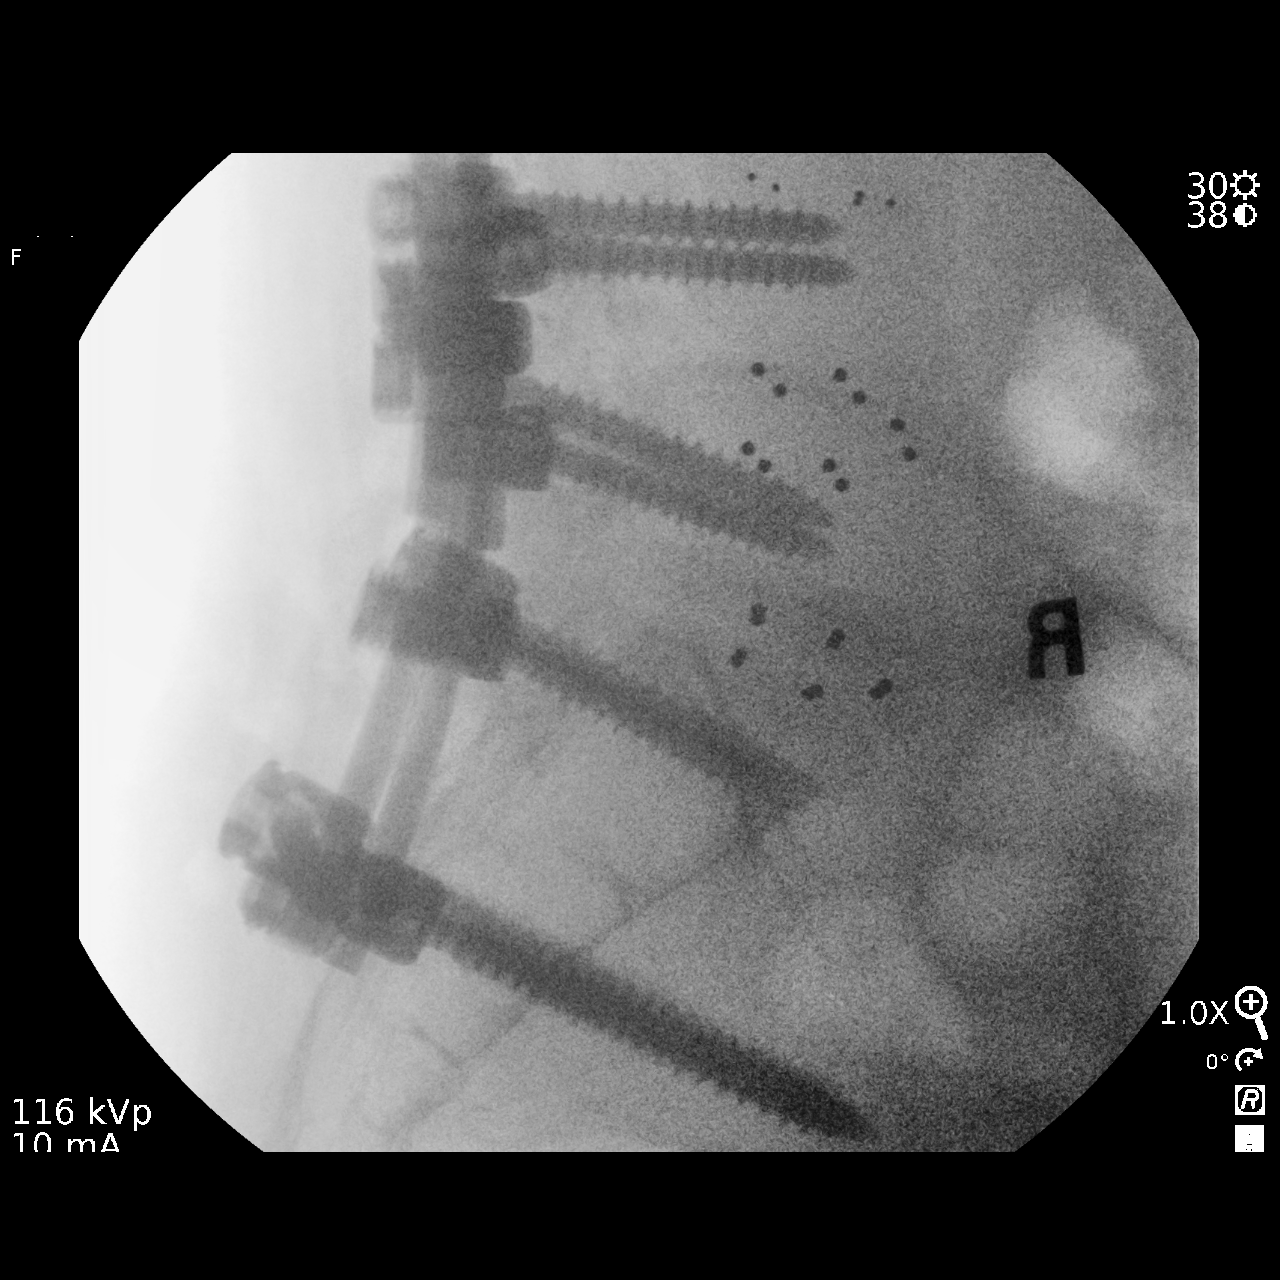

[2 of 2 positions shown; findings below may reference images not displayed]

FINDINGS: AP and lateral view intraoperative fluoroscopic images of the
lumbosacral spine and portions of the pelvis are submitted, 2 images
total. On the provided images, a partially imaged posterior spinal
fusion construct (consisting of bilateral pedicle screws and
vertical interconnecting rods) now extends caudally to the S1 level.
An interbody device is now present at L5-S1. Additionally,
components now extend from the caudal aspect of the posterior spinal
fusion construct to screws in the bilateral iliac bones.
IMPRESSION: Two intraoperative fluoroscopic images of the lumbosacral spine and
pelvis, as described

## 2022-04-01 ENCOUNTER — Ambulatory Visit (INDEPENDENT_AMBULATORY_CARE_PROVIDER_SITE_OTHER): Payer: Medicare HMO

## 2022-04-01 VITALS — BP 112/72 | HR 68 | Resp 16 | Ht 65.0 in | Wt 213.2 lb

## 2022-04-01 DIAGNOSIS — Z0000 Encounter for general adult medical examination without abnormal findings: Secondary | ICD-10-CM

## 2022-04-01 NOTE — Progress Notes (Signed)
Subjective:   Caitlin Ho is a 67 y.o. female who presents for Medicare Annual (Subsequent) preventive examination.  This wellness visit is conducted by a nurse.  The patient's medications were reviewed and reconciled since the patient's last visit.  History details were provided by the patient.  The history appears to be reliable.    Medical History: Patient history and Family history was reviewed  Medications, Allergies, and preventative health maintenance was reviewed and updated.  Cardiac Risk Factors include: diabetes mellitus;dyslipidemia;hypertension;obesity (BMI >30kg/m2)     Objective:    Today's Vitals   04/01/22 1403 04/01/22 1415  BP:  112/72  Pulse:  68  Resp:  16  SpO2:  93%  Weight:  213 lb 3.2 oz (96.7 kg)  Height:  '5\' 5"'$  (1.651 m)  PainSc: 4  4   PainLoc:  Back   Body mass index is 35.48 kg/m.     04/01/2022    2:25 PM 06/20/2021    2:16 PM 06/29/2020    3:50 PM 06/29/2020    6:33 AM 06/27/2020    2:34 PM 02/20/2018   10:31 AM 06/27/2014   12:34 PM  Advanced Directives  Does Patient Have a Medical Advance Directive? Yes No;Yes Yes Yes Yes No Yes  Type of Arts administrator Power of [place] of [place]  Does patient want to make changes to medical advance directive? No - Patient declined  No - Patient declined No - Patient declined   No - Patient declined  Copy of [name] in Chart? No - copy requested  No - copy requested No - copy requested No - copy requested  No - copy requested  Would patient like information on creating a medical advance directive?  No - Patient declined    No - Patient declined No - patient declined information    Current Medications (verified) Outpatient Encounter Medications as of 04/01/2022  Medication Sig   acetaminophen (TYLENOL) 500 MG tablet Take 2 tablets (1,000  mg total) by mouth every 6 (six) hours as needed for mild pain or moderate pain.   aspirin EC 81 MG tablet Take 1 tablet (81 mg total) by mouth daily.   cetirizine (ZYRTEC) 10 MG tablet Take 10 mg by mouth daily.   fenofibrate 160 MG tablet TAKE 1 TABLET EVERY DAY   furosemide (LASIX) 40 MG tablet TAKE 1 TABLET EVERY DAY   gabapentin (NEURONTIN) 600 MG tablet TAKE 1 TABLET THREE TIMES DAILY   levothyroxine (SYNTHROID) 125 MCG tablet Take 1 tablet (125 mcg total) by mouth daily before breakfast.   meloxicam (MOBIC) 15 MG tablet Take 1 tablet (15 mg total) by mouth daily as needed for pain (and inflammation).   methocarbamol (ROBAXIN) 500 MG tablet TAKE 1 TABLET THREE TIMES DAILY   metoprolol tartrate (LOPRESSOR) 25 MG tablet TAKE 1 TABLET TWICE DAILY   omeprazole (PRILOSEC) 40 MG capsule TAKE 1 CAPSULE EVERY DAY   polyethylene glycol (MIRALAX / GLYCOLAX) 17 g packet Take 17 g by mouth daily as needed for moderate constipation.   traMADol (ULTRAM) 50 MG tablet TAKE 2 TABLETS BY MOUTH IN THE MORNING, AT NOON, AND AT BEDTIME.   Vitamin D, Ergocalciferol, (DRISDOL) 1.25 MG (50000 UNIT) CAPS capsule Take 1 capsule (50,000 Units total) by mouth every 7 (seven) days.   glucosamine-chondroitin 500-400 MG tablet Take 1 tablet by mouth daily. (Patient  not taking: Reported on 04/01/2022)   No facility-administered encounter medications on file as of 04/01/2022.    Allergies (verified) Topamax [topiramate], Lipitor [atorvastatin], and Pravachol [pravastatin]   History: Past Medical History:  Diagnosis Date   Cancer ([place])    cervical cancer cells whwn patient was 22    Cervicalgia    Fibromyalgia    GERD (gastroesophageal reflux disease)    not on medication   Hashimoto's disease    Headache(784.0)    sinus   Homonymous hemianopia    Hyperlipidemia    Hypertension    Hypothyroid    Other sequelae of cerebral infarction    PONV (postoperative nausea and vomiting)    06/20/14 patient reported  that BP dropped real low and she had N/V  and was slow to awaken when she had surgery at an outpatient center.   Pre-diabetes    Seasonal allergies    Sleep apnea    no CPAP   Stroke ([place]) 2019   Vitamin D deficiency    Past Surgical History:  Procedure Laterality Date   ABDOMINAL HYSTERECTOMY  1995   APPENDECTOMY  1978   BACK SURGERY  06/29/2020   L5-S1   CERVICAL FUSION  2017   2 levels   DILATION AND CURETTAGE OF UTERUS     LUMBAR DISC SURGERY  10/03/2012   Dr Ellene Route  2 Discectomy   LUMBAR [place] SURGERY  07/2012   LUMBAR FUSION  2015   Fusion (2 levels)   LUMBAR FUSION  2016   fusion 1 level.   LUMBAR FUSION  02/16/2013   Fusion lumbar.   THYROIDECTOMY  2006   TOTAL HIP ARTHROPLASTY Right 07/03/2021   Procedure: TOTAL HIP ARTHROPLASTY ANTERIOR APPROACH;  Surgeon: Renette Butters, MD;  Location: [facility];  Service: Orthopedics;  Laterality: Right;   TUBAL LIGATION  1990   Family History  Problem Relation Age of Onset   Aneurysm Mother        brain   Heart attack Father 50   Cancer Paternal Grandmother        Breast   Hyperlipidemia Other    Hypertension Other    Diabetes Other    Social History   Socioeconomic History   Marital status: Married    Spouse name: Fritz Pickerel   Number of children: 1   Years of education: Not on file   Highest education level: Not on file  Occupational History   Occupation: Disabled  Tobacco Use   Smoking status: Never   Smokeless tobacco: Never  Vaping Use   Vaping Use: Never used  Substance and Sexual Activity   Alcohol use: No   Drug use: No   Sexual activity: Yes    Partners: Male    Birth control/protection: None  Other Topics Concern   Not on file  Social History Narrative   Not on file   Social Determinants of Health   Financial Resource Strain: Low Risk  (04/01/2022)   Overall Financial Resource Strain (CARDIA)    Difficulty of Paying Living Expenses: Not hard at all  Food Insecurity: No Food Insecurity (04/01/2022)    Hunger Vital Sign    Worried About Running Out of Food in the Last Year: Never true    [place] in the Last Year: Never true  Transportation Needs: No Transportation Needs (04/01/2022)   PRAPARE - Hydrologist (Medical): No    Lack of Transportation (Non-Medical): No  Physical Activity: Inactive (  04/01/2022)   Exercise Vital Sign    Days of Exercise per Week: 0 days    Minutes of Exercise per Session: 0 min  Stress: No Stress Concern Present (04/01/2022)   [place] of Stress : Not at all  Social Connections: [place] (04/01/2022)   Social Connection and Isolation Panel [NHANES]    Frequency of Communication with Friends and Family: More than three times a week    Frequency of Social Gatherings with Friends and Family: Once a week    Attends Religious Services: More than 4 times per year    Active Member of [employer] or Organizations: Yes    Attends Music therapist: More than 4 times per year    Marital Status: Married    Tobacco Counseling Counseling given: Not Answered   Clinical Intake:  Pre-visit preparation completed: Yes  Pain : 0-10 Pain Score: 4  Pain Type: Chronic pain Pain Location: Back Pain Descriptors / Indicators: Aching Pain Relieving Factors: injections, tramadol Effect of Pain on Daily Activities: moderate Pain Relieving Factors: injections, tramadol BMI - recorded: 35.48 Nutritional Status: BMI > 30  Obese Nutritional Risks: None Diabetes: Yes (last A1C 6.1) CBG done?: No Did pt. bring in CBG monitor from home?: No How often do you need to have someone help you when you read instructions, pamphlets, or other written materials from your doctor or pharmacy?: 1 - Never Interpreter Needed?: No    Activities of Daily Living    04/01/2022    2:06 PM 06/20/2021    2:18 PM  In your present state of health, do you have any  difficulty performing the following activities:  Hearing? 0   Vision? 0   Difficulty concentrating or making decisions? 0   Walking or climbing stairs? 0   Dressing or bathing? 0   Doing errands, shopping? 0 0  Preparing Food and eating ? N   Using the Toilet? N   In the past six months, have you accidently leaked urine? N   Do you have problems with loss of bowel control? N   Managing your Medications? N   Managing your Finances? N   Housekeeping or managing your Housekeeping? N     Patient Care Team: Rochel Brome, MD as PCP - General (Family Medicine) Lynnell Dike, [place] (Optometry) Kristeen Miss, MD as Consulting Physician (Neurosurgery) [facility]ists, [place] (Orthopedic Surgery)     Assessment:   This is a routine wellness examination for [place].  Hearing/Vision screen No results found.  Dietary issues and exercise activities discussed: Current Exercise Habits: The patient does not participate in regular exercise at present, Exercise limited by: orthopedic condition(s) (back pain)   Goals Addressed             This Visit's Progress    Weight (lb) < 200 lb (90.7 kg)   213 lb 3.2 oz (96.7 kg)      Depression Screen    04/01/2022    2:05 PM 05/10/2021    2:01 PM 02/15/2020    4:21 PM 05/24/2019   12:41 PM  PHQ 2/9 Scores  PHQ - 2 Score 0 0 0 0  PHQ- 9 Score   0     Fall Risk    04/01/2022    2:05 PM 05/10/2021    2:01 PM 02/15/2020   10:13 AM 02/16/2018    1:19 PM  Fall Risk   Falls in the  past year? 0 0 1 0  Number falls in past yr: 0 0 0   Injury with Fall? 0 0 0   Risk for fall due to : No Fall Risks No Fall Risks Orthopedic patient   Follow up Falls evaluation completed;Education provided Falls evaluation completed Falls evaluation completed;Education provided;Falls prevention discussed     FALL RISK PREVENTION PERTAINING TO THE HOME:  Home free of loose throw rugs in walkways, pet beds, electrical cords, etc? Yes  Adequate lighting  in your home to reduce risk of falls? Yes   ASSISTIVE DEVICES UTILIZED TO PREVENT FALLS:  Life alert? No  Use of a cane, walker or w/c? No  Grab bars in the bathroom? Yes  Shower chair or bench in shower? No  Elevated toilet seat or a handicapped toilet? No   Gait slow and steady without use of assistive device  Cognitive Function:        04/01/2022    2:26 PM 02/15/2020   10:02 AM  6CIT Screen  What Year? 0 points 0 points  What month? 0 points 0 points  What time? 0 points 0 points  Count back from 20 0 points 0 points  Months in reverse 0 points 0 points  Repeat phrase 2 points 0 points  Total Score 2 points 0 points    Immunizations Immunization History  Administered Date(s) Administered   Influenza Inj Mdck Quad Pf 02/15/2020, 01/22/2021   Influenza-Unspecified 01/14/2019, 12/26/2021   PFIZER Comirnaty(Gray Top)Covid-19 Tri-Sucrose Vaccine 10/16/2020   PFIZER(Purple Top)SARS-COV-2 Vaccination 05/06/2019, 05/25/2019, 01/12/2020   Pneumococcal Polysaccharide-23 01/12/2018   Tdap 01/12/2018   Zoster Recombinat (Shingrix) 08/20/2021, 12/13/2021    TDAP status: Up to date  Flu Vaccine status: Up to date  Pneumococcal vaccine status: Up to date  Covid-19 vaccine status: Information provided on how to obtain vaccines.   Qualifies for Shingles Vaccine? Yes   Zostavax completed No   Shingrix Completed?: Yes  Screening Tests Health Maintenance  Topic Date Due   Medicare Annual Wellness (AWV)  02/14/2021   FOOT EXAM  05/25/2021   COVID-19 Vaccine (5 - 2023-24 season) 11/02/2021   COLONOSCOPY (Pts 45-50yr Insurance coverage will need to be confirmed)  07/27/2022 (Originally 07/29/2002)   MAMMOGRAM  08/09/2022   HEMOGLOBIN A1C  08/29/2022   OPHTHALMOLOGY EXAM  11/20/2022   Diabetic kidney evaluation - eGFR measurement  02/28/2023   Diabetic kidney evaluation - Urine ACR  02/28/2023   DTaP/Tdap/Td (2 - Td or Tdap) 01/13/2028   INFLUENZA VACCINE  Completed    Hepatitis C Screening  Completed   HIV Screening  Completed   Zoster Vaccines- Shingrix  Completed   HPV VACCINES  Aged Out   PAP SMEAR-Modifier  Discontinued    Health Maintenance  Health Maintenance Due  Topic Date Due   Medicare Annual Wellness (AWV)  02/14/2021   FOOT EXAM  05/25/2021   COVID-19 Vaccine (5 - 2023-24 season) 11/02/2021    Colorectal cancer screening: Patient declined  Mammogram status: Completed 08/08/21. Repeat every year  Lung Cancer Screening: (Low Dose CT Chest recommended if Age 65-80years, 30 pack-year currently smoking OR have quit w/in 15years.) does not qualify.   Lung Cancer Screening Referral: N/A  Additional Screening:  Vision Screening: Recommended annual ophthalmology exams for early detection of glaucoma and other disorders of the eye. Is the patient up to date with their annual eye exam?  Yes  Who is the provider or what is the name of the office in which  the patient attends annual eye exams? Dr Renaldo Fiddler [facility] If pt is not established with a provider, would they like to be referred to a provider to establish care?  N/A .   Dental Screening: Recommended annual dental exams for proper oral hygiene  Community Resource Referral / Chronic Care Management: CRR required this visit?  No   CCM required this visit?  No      Plan:     I have personally reviewed and noted the following in the patient's chart:   Medical and social history Use of alcohol, tobacco or illicit drugs  Current medications and supplements including opioid prescriptions. Patient is not currently taking opioid prescriptions. Functional ability and status Nutritional status Physical activity Advanced directives List of other physicians Hospitalizations, surgeries, and ER visits in previous 12 months Vitals Screenings to include cognitive, depression, and falls Referrals and appointments  In addition, I have reviewed and discussed with patient certain  preventive protocols, quality metrics, and best practice recommendations. A written personalized care plan for preventive services as well as general preventive health recommendations were provided to patient.     Erie Noe, LPN   03/22/4172

## 2022-04-01 NOTE — Patient Instructions (Signed)
[name] , Thank you for taking time to come for your Medicare Wellness Visit. I appreciate your ongoing commitment to your health goals. Please review the following plan we discussed and let me know if I can assist you in the future.   Screening recommendations/referrals: Colonoscopy: Due - patient declined Mammogram: Due in June Recommended yearly ophthalmology/optometry visit for glaucoma screening and checkup Recommended yearly dental visit for hygiene and checkup  Vaccinations: Influenza vaccine: up-to-date Pneumococcal vaccine: up-to-date Tdap vaccine: Due 01/2028 Shingles vaccine: Complete    Advanced directives: Please bring a copy for your medical record    Preventive Care 40-64 Years, Female Preventive care refers to lifestyle choices and visits with your health care provider that can promote health and wellness. What does preventive care include? A yearly physical exam. This is also called an annual well check. Dental exams once or twice a year. Routine eye exams. Ask your health care provider how often you should have your eyes checked. Personal lifestyle choices, including: Daily care of your teeth and gums. Regular physical activity. Eating a healthy diet. Avoiding tobacco and drug use. Limiting alcohol use. Practicing safe sex. Taking low-dose aspirin daily starting at age 64. Taking vitamin and mineral supplements as recommended by your health care provider. What happens during an annual well check? The services and screenings done by your health care provider during your annual well check will depend on your age, overall health, lifestyle risk factors, and family history of disease. Counseling  Your health care provider may ask you questions about your: Alcohol use. Tobacco use. Drug use. Emotional well-being. Home and relationship well-being. Sexual activity. Eating habits. Work and work Statistician. Method of birth control. Menstrual  cycle. Pregnancy history. Screening  You may have the following tests or measurements: Height, weight, and BMI. Blood pressure. Lipid and cholesterol levels. These may be checked every 5 years, or more frequently if you are over 45 years old. Skin check. Lung cancer screening. You may have this screening every year starting at age 82 if you have a 30-pack-year history of smoking and currently smoke or have quit within the past 15 years. Fecal occult blood test (FOBT) of the stool. You may have this test every year starting at age 59. Flexible sigmoidoscopy or colonoscopy. You may have a sigmoidoscopy every 5 years or a colonoscopy every 10 years starting at age 73. Hepatitis C blood test. Hepatitis B blood test. Sexually transmitted disease (STD) testing. Diabetes screening. This is done by checking your blood sugar (glucose) after you have not eaten for a while (fasting). You may have this done every 1-3 years. Mammogram. This may be done every 1-2 years. Talk to your health care provider about when you should start having regular mammograms. This may depend on whether you have a family history of breast cancer. BRCA-related cancer screening. This may be done if you have a family history of breast, ovarian, tubal, or peritoneal cancers. Pelvic exam and Pap test. This may be done every 3 years starting at age 77. Starting at age 46, this may be done every 5 years if you have a Pap test in combination with an HPV test. Bone density scan. This is done to screen for osteoporosis. You may have this scan if you are at high risk for osteoporosis. Discuss your test results, treatment options, and if necessary, the need for more tests with your health care provider. Vaccines  Your health care provider may recommend certain vaccines, such as: Influenza vaccine. This  is recommended every year. Tetanus, diphtheria, and acellular pertussis (Tdap, Td) vaccine. You may need a Td booster every 10  years. Zoster vaccine. You may need this after age 7. Pneumococcal 13-valent conjugate (PCV13) vaccine. You may need this if you have certain conditions and were not previously vaccinated. Pneumococcal polysaccharide (PPSV23) vaccine. You may need one or two doses if you smoke cigarettes or if you have certain conditions. Talk to your health care provider about which screenings and vaccines you need and how often you need them. This information is not intended to replace advice given to you by your health care provider. Make sure you discuss any questions you have with your health care provider. Document Released: 03/17/2015 Document Revised: 11/08/2015 Document Reviewed: 12/20/2014 Elsevier Interactive Patient Education  2017 [place] Prevention in the Home Falls can cause injuries. They can happen to people of all ages. There are many things you can do to make your home safe and to help prevent falls. What can I do on the outside of my home? Regularly fix the edges of walkways and driveways and fix any cracks. Remove anything that might make you trip as you walk through a door, such as a raised step or threshold. Trim any bushes or trees on the path to your home. Use bright outdoor lighting. Clear any walking paths of anything that might make someone trip, such as rocks or tools. Regularly check to see if handrails are loose or broken. Make sure that both sides of any steps have handrails. Any raised decks and porches should have guardrails on the edges. Have any leaves, snow, or ice cleared regularly. Use sand or salt on walking paths during winter. Clean up any spills in your garage right away. This includes oil or grease spills. What can I do in the bathroom? Use night lights. Install grab bars by the toilet and in the tub and shower. Do not use towel bars as grab bars. Use non-skid mats or decals in the tub or shower. If you need to sit down in the shower, use a  plastic, non-slip stool. Keep the floor dry. Clean up any water that spills on the floor as soon as it happens. Remove soap buildup in the tub or shower regularly. Attach bath mats securely with double-sided non-slip rug tape. Do not have throw rugs and other things on the floor that can make you trip. What can I do in the bedroom? Use night lights. Make sure that you have a light by your bed that is easy to reach. Do not use any sheets or blankets that are too big for your bed. They should not hang down onto the floor. Have a firm chair that has side arms. You can use this for support while you get dressed. Do not have throw rugs and other things on the floor that can make you trip. What can I do in the kitchen? Clean up any spills right away. Avoid walking on wet floors. Keep items that you use a lot in easy-to-reach places. If you need to reach something above you, use a strong step stool that has a grab bar. Keep electrical cords out of the way. Do not use floor polish or wax that makes floors slippery. If you must use wax, use non-skid floor wax. Do not have throw rugs and other things on the floor that can make you trip. What can I do with my stairs? Do not leave any items on the  stairs. Make sure that there are handrails on both sides of the stairs and use them. Fix handrails that are broken or loose. Make sure that handrails are as long as the stairways. Check any carpeting to make sure that it is firmly attached to the stairs. Fix any carpet that is loose or worn. Avoid having throw rugs at the top or bottom of the stairs. If you do have throw rugs, attach them to the floor with carpet tape. Make sure that you have a light switch at the top of the stairs and the bottom of the stairs. If you do not have them, ask someone to add them for you. What else can I do to help prevent falls? Wear shoes that: Do not have high heels. Have rubber bottoms. Are comfortable and fit you  well. Are closed at the toe. Do not wear sandals. If you use a stepladder: Make sure that it is fully opened. Do not climb a closed stepladder. Make sure that both sides of the stepladder are locked into place. Ask someone to hold it for you, if possible. Clearly mark and make sure that you can see: Any grab bars or handrails. First and last steps. Where the edge of each step is. Use tools that help you move around (mobility aids) if they are needed. These include: Canes. Walkers. Scooters. Crutches. Turn on the lights when you go into a dark area. Replace any light bulbs as soon as they burn out. Set up your furniture so you have a clear path. Avoid moving your furniture around. If any of your floors are uneven, fix them. If there are any pets around you, be aware of where they are. Review your medicines with your doctor. Some medicines can make you feel dizzy. This can increase your chance of falling. Ask your doctor what other things that you can do to help prevent falls. This information is not intended to replace advice given to you by your health care provider. Make sure you discuss any questions you have with your health care provider. Document Released: 12/15/2008 Document Revised: 07/27/2015 Document Reviewed: 03/25/2014 Elsevier Interactive Patient Education  2017 [employer].

## 2022-04-14 ENCOUNTER — Other Ambulatory Visit: Payer: Self-pay | Admitting: Physician Assistant

## 2022-04-18 DIAGNOSIS — M5116 Intervertebral disc disorders with radiculopathy, lumbar region: Secondary | ICD-10-CM | POA: Diagnosis not present

## 2022-04-18 DIAGNOSIS — M5415 Radiculopathy, thoracolumbar region: Secondary | ICD-10-CM | POA: Diagnosis not present

## 2022-04-23 ENCOUNTER — Telehealth: Payer: Medicare HMO | Admitting: Family Medicine

## 2022-04-23 ENCOUNTER — Telehealth: Payer: Self-pay

## 2022-04-23 DIAGNOSIS — R6889 Other general symptoms and signs: Secondary | ICD-10-CM | POA: Diagnosis not present

## 2022-04-23 MED ORDER — OSELTAMIVIR PHOSPHATE 75 MG PO CAPS: 75.0000 mg | ORAL_CAPSULE | Freq: Two times a day (BID) | ORAL | 0 refills | Status: AC

## 2022-04-23 NOTE — Telephone Encounter (Signed)
Patient called and stated that she started getting sick Sunday, ST, HA, Congestion. Tested for Covid on 04/22/22 and it was negative. Woke up this morning feeling worst.   Recommended patient do a my chart virtual visit with  due to office not having any appointments.  Patient Made Aware, Verbalized Understanding.

## 2022-04-23 NOTE — Progress Notes (Signed)
Virtual Visit Consent   Caitlin Ho, you are scheduled for a virtual visit with a [facility] provider today. Just as with appointments in the office, your consent must be obtained to participate. Your consent will be active for this visit and any virtual visit you may have with one of our providers in the next 365 days. If you have a MyChart account, a copy of this consent can be sent to you electronically.  As this is a virtual visit, video technology does not allow for your provider to perform a traditional examination. This may limit your provider's ability to fully assess your condition. If your provider identifies any concerns that need to be evaluated in person or the need to arrange testing (such as labs, EKG, etc.), we will make arrangements to do so. Although advances in technology are sophisticated, we cannot ensure that it will always work on either your end or our end. If the connection with a video visit is poor, the visit may have to be switched to a telephone visit. With either a video or telephone visit, we are not always able to ensure that we have a secure connection.  By engaging in this virtual visit, you consent to the provision of healthcare and authorize for your insurance to be billed (if applicable) for the services provided during this visit. Depending on your insurance coverage, you may receive a charge related to this service.  I need to obtain your verbal consent now. Are you willing to proceed with your visit today? Caitlin Ho has provided verbal consent on 04/23/2022 for a virtual visit (video or telephone). Caitlin Mayo, NP  Date: 04/23/2022 12:29 PM  Virtual Visit via Video Note   I, Caitlin Ho, connected with  Caitlin Ho  ([id number], 28-Aug-1955) on 04/23/22 at 12:45 PM EST by a video-enabled telemedicine application and verified that I am speaking with the correct person using two identifiers.  Location: Patient: Virtual Visit Location Patient:  Home Provider: Virtual Visit Location Provider: Home Office   I discussed the limitations of evaluation and management by telemedicine and the availability of in person appointments. The patient expressed understanding and agreed to proceed.    History of Present Illness: Caitlin Ho is a 67 y.o. who identifies as a female who was assigned female at birth, and is being seen today for flu like illness.  Onset was Sunday night- 2 days - sneezing. Associated symptoms are sneezing, chills (unsure of fever), sore throat, headache, cough-dry, taste is off. Modifying factors are coricidin (not helped much)  Denies chest pain, shortness of breath, congestion. 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   Prediabetes 08/06/2019   Chronic neck pain with history of cervical spinal surgery 06/29/2019   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 [id number]   Stroke ([place]) 06/29/2019   Class 2 severe obesity with body mass index (BMI) of 35 to 39.9 with serious comorbidity ([place]) 05/24/2019   Essential hypertension, benign 05/05/2019   Mixed hyperlipidemia 05/05/2019   Secondary hypothyroidism  05/05/2019   Impaired glucose metabolism 05/05/2019   Comorbid sleep-related hypoventilation 10/25/2018   Occipital cerebral infarction ([place]) 01/01/2018   Lumbar radiculopathy 07/19/2013   Lumbosacral radiculitis 06/10/2013   Chronic low back pain 03/11/2013   Lumbar stenosis 02/16/2013    Allergies:  Allergies  Allergen Reactions    Topamax [Topiramate] Other (See Comments)    Fatigue, constipation, gum tenderness, breast tenderness   Lipitor [Atorvastatin]     Myalgia   Pravachol [Pravastatin]     Joint pain   Medications:  Current Outpatient Medications:    acetaminophen (TYLENOL) 500 MG tablet, Take 2 tablets (1,000 mg total) by mouth every 6 (six) hours as needed for mild pain or moderate pain., Disp: 60 tablet, Rfl: 0   aspirin EC 81 MG tablet, Take 1 tablet (81 mg total) by mouth daily., Disp: 30 tablet, Rfl: 11   cetirizine (ZYRTEC) 10 MG tablet, Take 10 mg by mouth daily., Disp: , Rfl:    fenofibrate 160 MG tablet, TAKE 1 TABLET EVERY DAY, Disp: 90 tablet, Rfl: 1   furosemide (LASIX) 40 MG tablet, TAKE 1 TABLET EVERY DAY, Disp: 90 tablet, Rfl: 3   gabapentin (NEURONTIN) 600 MG tablet, TAKE 1 TABLET THREE TIMES DAILY, Disp: 270 tablet, Rfl: 0   glucosamine-chondroitin 500-400 MG tablet, Take 1 tablet by mouth daily. (Patient not taking: Reported on 04/01/2022), Disp: , Rfl:    levothyroxine (SYNTHROID) 125 MCG tablet, Take 1 tablet (125 mcg total) by mouth daily before breakfast., Disp: 90 tablet, Rfl: 1   meloxicam (MOBIC) 15 MG tablet, Take 1 tablet (15 mg total) by mouth daily as needed for pain (and inflammation)., Disp: 90 tablet, Rfl: 3   methocarbamol (ROBAXIN) 500 MG tablet, TAKE 1 TABLET THREE TIMES DAILY, Disp: 270 tablet, Rfl: 1   metoprolol tartrate (LOPRESSOR) 25 MG tablet, TAKE 1 TABLET TWICE DAILY, Disp: 180 tablet, Rfl: 1   omeprazole (PRILOSEC) 40 MG capsule, TAKE 1 CAPSULE EVERY DAY, Disp: 90 capsule, Rfl: 0   polyethylene glycol (MIRALAX / GLYCOLAX) 17 g packet, Take 17 g by mouth daily as needed for moderate constipation., Disp: , Rfl:    traMADol (ULTRAM) 50 MG tablet, TAKE 2 TABLETS BY MOUTH IN THE MORNING, AT NOON, AND AT BEDTIME., Disp: 180 tablet, Rfl: 2   Vitamin D, Ergocalciferol, (DRISDOL) 1.25 MG (50000 UNIT) CAPS capsule, Take 1 capsule (50,000 Units total) by mouth every 7 (seven)  days., Disp: 12 capsule, Rfl: 1  Observations/Objective: Patient is well-developed, well-nourished in no acute distress.  Resting comfortably  at home.  Head is normocephalic, atraumatic.  No labored breathing.  Speech is clear and coherent with logical content.  Patient is alert and oriented at baseline.    Assessment and Plan:  1. Flu-like symptoms  - oseltamivir (TAMIFLU) 75 MG capsule; Take 1 capsule (75 mg total) by mouth 2 (two) times daily for 5 days.  Dispense: 10 capsule; Refill: 0  -Take meds as prescribed -Rest -Use a cool mist humidifier especially during the winter months when heat dries out the air. - Use saline nose sprays frequently to help soothe nasal passages and promote drainage. -Saline irrigations of the nose can be very helpful if done frequently.             * 4X daily for 1 week*             * Use of a nettie pot can be helpful with this.  *Follow directions with this* *Boiled or distilled water only -  stay hydrated by drinking plenty of fluids - Keep thermostat turn down low to prevent drying out sinuses - For any cough or congestion- robitussin DM or Delsym as needed - For fever or aches or pains- take tylenol or ibuprofen as directed on bottle             * for fevers greater than 101 orally you may alternate ibuprofen and tylenol every 3 hours.  If you do not improve you will need a follow up visit in person.                 Reviewed side effects, risks and benefits of medication.    Patient acknowledged agreement and understanding of the plan.   Past Medical, Surgical, Social History, Allergies, and Medications have been Reviewed.    Follow Up Instructions: I discussed the assessment and treatment plan with the patient. The patient was provided an opportunity to ask questions and all were answered. The patient agreed with the plan and demonstrated an understanding of the instructions.  A copy of instructions were sent to the patient via  MyChart unless otherwise noted below.    The patient was advised to call back or seek an in-person evaluation if the symptoms worsen or if the condition fails to improve as anticipated.  Time:  I spent 7 minutes with the patient via telehealth technology discussing the above problems/concerns.    Caitlin Mayo, NP

## 2022-04-23 NOTE — Patient Instructions (Signed)
Caitlin Ho, thank you for joining [name], NP for today's virtual visit.  While this provider is not your primary care provider (PCP), if your PCP is located in our provider database this encounter information will be shared with them immediately following your visit.   [facility] account gives you access to today's visit and all your visits, tests, and labs performed at [facility] " click here if you don't have a [facility] account or go to [email or web address]  Consent: (Patient) Caitlin Ho provided verbal consent for this virtual visit at the beginning of the encounter.  Current Medications:  Current Outpatient Medications:    oseltamivir (TAMIFLU) 75 MG capsule, Take 1 capsule (75 mg total) by mouth 2 (two) times daily for 5 days., Disp: 10 capsule, Rfl: 0   acetaminophen (TYLENOL) 500 MG tablet, Take 2 tablets (1,000 mg total) by mouth every 6 (six) hours as needed for mild pain or moderate pain., Disp: 60 tablet, Rfl: 0   aspirin EC 81 MG tablet, Take 1 tablet (81 mg total) by mouth daily., Disp: 30 tablet, Rfl: 11   cetirizine (ZYRTEC) 10 MG tablet, Take 10 mg by mouth daily., Disp: , Rfl:    fenofibrate 160 MG tablet, TAKE 1 TABLET EVERY DAY, Disp: 90 tablet, Rfl: 1   furosemide (LASIX) 40 MG tablet, TAKE 1 TABLET EVERY DAY, Disp: 90 tablet, Rfl: 3   gabapentin (NEURONTIN) 600 MG tablet, TAKE 1 TABLET THREE TIMES DAILY, Disp: 270 tablet, Rfl: 0   glucosamine-chondroitin 500-400 MG tablet, Take 1 tablet by mouth daily. (Patient not taking: Reported on 04/01/2022), Disp: , Rfl:    levothyroxine (SYNTHROID) 125 MCG tablet, Take 1 tablet (125 mcg total) by mouth daily before breakfast., Disp: 90 tablet, Rfl: 1   meloxicam (MOBIC) 15 MG tablet, Take 1 tablet (15 mg total) by mouth daily as needed for pain (and inflammation)., Disp: 90 tablet, Rfl: 3   methocarbamol (ROBAXIN) 500 MG tablet, TAKE 1 TABLET THREE TIMES DAILY, Disp: 270 tablet,  Rfl: 1   metoprolol tartrate (LOPRESSOR) 25 MG tablet, TAKE 1 TABLET TWICE DAILY, Disp: 180 tablet, Rfl: 1   omeprazole (PRILOSEC) 40 MG capsule, TAKE 1 CAPSULE EVERY DAY, Disp: 90 capsule, Rfl: 0   polyethylene glycol (MIRALAX / GLYCOLAX) 17 g packet, Take 17 g by mouth daily as needed for moderate constipation., Disp: , Rfl:    traMADol (ULTRAM) 50 MG tablet, TAKE 2 TABLETS BY MOUTH IN THE MORNING, AT NOON, AND AT BEDTIME., Disp: 180 tablet, Rfl: 2   Vitamin D, Ergocalciferol, (DRISDOL) 1.25 MG (50000 UNIT) CAPS capsule, Take 1 capsule (50,000 Units total) by mouth every 7 (seven) days., Disp: 12 capsule, Rfl: 1   Medications ordered in this encounter:  Meds ordered this encounter  Medications   oseltamivir (TAMIFLU) 75 MG capsule    Sig: Take 1 capsule (75 mg total) by mouth 2 (two) times daily for 5 days.    Dispense:  10 capsule    Refill:  0    Order Specific Question:   Supervising Provider    Answer:   Chase Picket [id number]     *If you need refills on other medications prior to your next appointment, please contact your pharmacy*  Follow-Up: Call back or seek an in-person evaluation if the symptoms worsen or if the condition fails to improve as anticipated.  [place] [phone]  Other Instructions  -Take meds as prescribed -Rest -Use a  cool mist humidifier especially during the winter months when heat dries out the air. - Use saline nose sprays frequently to help soothe nasal passages and promote drainage. -Saline irrigations of the nose can be very helpful if done frequently.             * 4X daily for 1 week*             * Use of a nettie pot can be helpful with this.  *Follow directions with this* *Boiled or distilled water only -stay hydrated by drinking plenty of fluids - Keep thermostat turn down low to prevent drying out sinuses - For any cough or congestion- robitussin DM or Delsym as needed - For fever or aches or pains- take tylenol  or ibuprofen as directed on bottle             * for fevers greater than 101 orally you may alternate ibuprofen and tylenol every 3 hours.  If you do not improve you will need a follow up visit in person.                  If you have been instructed to have an in-person evaluation today at a local Urgent Care facility, please use the link below. It will take you to a list of all of our available [facility], including address, phone number and hours of operation. Please do not delay care.  [facility]  If you or a family member do not have a primary care provider, use the link below to schedule a visit and establish care. When you choose a [facility] primary care physician or advanced practice provider, you gain a long-term partner in health. Find a Primary Care Provider  Learn more about 's in-office and virtual care options: [facility]

## 2022-05-13 ENCOUNTER — Other Ambulatory Visit: Payer: Self-pay | Admitting: Physician Assistant

## 2022-05-18 ENCOUNTER — Other Ambulatory Visit: Payer: Self-pay | Admitting: Physician Assistant

## 2022-05-18 ENCOUNTER — Other Ambulatory Visit: Payer: Self-pay | Admitting: Family Medicine

## 2022-05-20 ENCOUNTER — Other Ambulatory Visit: Payer: Self-pay

## 2022-05-20 MED ORDER — FUROSEMIDE 40 MG PO TABS: 40.0000 mg | ORAL_TABLET | Freq: Every day | ORAL | 1 refills | Status: DC

## 2022-05-20 MED ORDER — FENOFIBRATE 160 MG PO TABS: 160.0000 mg | ORAL_TABLET | Freq: Every day | ORAL | 1 refills | Status: DC

## 2022-05-22 ENCOUNTER — Other Ambulatory Visit: Payer: Self-pay

## 2022-05-23 ENCOUNTER — Other Ambulatory Visit: Payer: Self-pay

## 2022-05-23 MED ORDER — FENOFIBRATE 160 MG PO TABS: 160.0000 mg | ORAL_TABLET | Freq: Every day | ORAL | 0 refills | Status: DC

## 2022-06-03 ENCOUNTER — Ambulatory Visit (INDEPENDENT_AMBULATORY_CARE_PROVIDER_SITE_OTHER): Payer: Medicare HMO | Admitting: Family Medicine

## 2022-06-03 ENCOUNTER — Encounter: Payer: Self-pay | Admitting: Family Medicine

## 2022-06-03 VITALS — BP 124/68 | HR 69 | Temp 96.8°F | Ht 65.0 in | Wt 212.0 lb

## 2022-06-03 DIAGNOSIS — I10 Essential (primary) hypertension: Secondary | ICD-10-CM

## 2022-06-03 DIAGNOSIS — M545 Low back pain, unspecified: Secondary | ICD-10-CM | POA: Diagnosis not present

## 2022-06-03 DIAGNOSIS — E038 Other specified hypothyroidism: Secondary | ICD-10-CM

## 2022-06-03 DIAGNOSIS — K219 Gastro-esophageal reflux disease without esophagitis: Secondary | ICD-10-CM

## 2022-06-03 DIAGNOSIS — G8929 Other chronic pain: Secondary | ICD-10-CM | POA: Diagnosis not present

## 2022-06-03 DIAGNOSIS — E782 Mixed hyperlipidemia: Secondary | ICD-10-CM | POA: Diagnosis not present

## 2022-06-03 DIAGNOSIS — E559 Vitamin D deficiency, unspecified: Secondary | ICD-10-CM | POA: Diagnosis not present

## 2022-06-03 DIAGNOSIS — R7303 Prediabetes: Secondary | ICD-10-CM | POA: Diagnosis not present

## 2022-06-03 NOTE — Progress Notes (Signed)
Subjective:  Patient ID: Caitlin Ho, female    DOB: 29-Dec-1955  Age: 67 y.o. MRN: [id number]  Chief Complaint  Patient presents with   Hyperlipidemia   Hypertension   Hypothyroidism    HPI:  Hypertension:  Metoprolol tartrate 25 mg twice daily. Furosemide 40 mg one daily. History of stroke - on aspirin 81 mg daily.  Hyperlipidemia:  Fenofibrate 160 mg daily.  Eating healthy.  Hypothyroidism:  Levothyroxine 125 mcg daily.   GERD:  Omeprazole 40 mg daily.   Chronic back pain. Sees Dr. Danielle DessElsner. On mobic 15 mg daily (but not very effective, advil causes upset stomach), robaxin and tramadol (2 BID) Esi injections in February which helped for approx 2 week.     06/03/2022    9:35 AM 04/01/2022    2:05 PM 05/10/2021    2:01 PM 02/15/2020    4:21 PM 05/24/2019   12:41 PM  Depression screen PHQ 2/9  Decreased Interest 0 0 0 0 0  Down, Depressed, Hopeless 0 0 0 0 0  PHQ - 2 Score 0 0 0 0 0  Altered sleeping    0   Tired, decreased energy    0   Change in appetite    0   Feeling bad or failure about yourself     0   Trouble concentrating    0   Moving slowly or fidgety/restless    0   Suicidal thoughts    0   PHQ-9 Score    0   Difficult doing work/chores    Not difficult at all          07/01/2020    8:00 AM 05/10/2021    2:01 PM 07/03/2021    8:35 AM 04/01/2022    2:05 PM 06/03/2022    9:35 AM  Fall Risk  Falls in the past year?  0  0 0  Was there an injury with Fall?  0  0 0  Fall Risk Category Calculator  0  0 0  Fall Risk Category (Retired)  Low     (RETIRED) Patient Fall Risk Level Moderate fall risk Low fall risk Moderate fall risk    Patient at Risk for Falls Due to  No Fall Risks  No Fall Risks No Fall Risks  Fall risk Follow up  Falls evaluation completed  Falls evaluation completed;Education provided Falls evaluation completed      Review of Systems  Constitutional:  Negative for chills, fatigue and fever.  HENT:  Negative for congestion, ear pain, rhinorrhea and  sore throat.   Respiratory:  Negative for cough and shortness of breath.   Cardiovascular:  Negative for chest pain.  Gastrointestinal:  Negative for abdominal pain, constipation, diarrhea, nausea and vomiting.  Genitourinary:  Negative for dysuria and urgency.  Musculoskeletal:  Negative for back pain and myalgias.  Neurological:  Positive for headaches. Negative for dizziness, weakness and light-headedness.  Psychiatric/Behavioral:  Negative for dysphoric mood. The patient is not nervous/anxious.     Current Outpatient Medications on File Prior to Visit  Medication Sig Dispense Refill   acetaminophen (TYLENOL) 500 MG tablet Take 2 tablets (1,000 mg total) by mouth every 6 (six) hours as needed for mild pain or moderate pain. 60 tablet 0   aspirin EC 81 MG tablet Take 1 tablet (81 mg total) by mouth daily. 30 tablet 11   cetirizine (ZYRTEC) 10 MG tablet Take 10 mg by mouth daily.     fenofibrate 160 MG tablet  Take 1 tablet (160 mg total) by mouth daily. 90 tablet 0   furosemide (LASIX) 40 MG tablet Take 1 tablet (40 mg total) by mouth daily. 90 tablet 1   gabapentin (NEURONTIN) 600 MG tablet TAKE 1 TABLET THREE TIMES DAILY 270 tablet 3   glucosamine-chondroitin 500-400 MG tablet Take 1 tablet by mouth daily. (Patient not taking: Reported on 04/01/2022)     levothyroxine (SYNTHROID) 125 MCG tablet Take 1 tablet (125 mcg total) by mouth daily before breakfast. 90 tablet 1   meloxicam (MOBIC) 15 MG tablet Take 1 tablet (15 mg total) by mouth daily as needed for pain (and inflammation). 90 tablet 3   methocarbamol (ROBAXIN) 500 MG tablet TAKE 1 TABLET THREE TIMES DAILY 270 tablet 3   metoprolol tartrate (LOPRESSOR) 25 MG tablet TAKE 1 TABLET TWICE DAILY 180 tablet 1   omeprazole (PRILOSEC) 40 MG capsule TAKE 1 CAPSULE EVERY DAY 90 capsule 3   polyethylene glycol (MIRALAX / GLYCOLAX) 17 g packet Take 17 g by mouth daily as needed for moderate constipation.     traMADol (ULTRAM) 50 MG tablet TAKE  2 TABLETS BY MOUTH IN THE MORNING, AT NOON, AND AT BEDTIME. 180 tablet 2   Vitamin D, Ergocalciferol, (DRISDOL) 1.25 MG (50000 UNIT) CAPS capsule Take 1 capsule (50,000 Units total) by mouth every 7 (seven) days. 12 capsule 1   No current facility-administered medications on file prior to visit.   Past Medical History:  Diagnosis Date   Cancer    cervical cancer cells whwn patient was 22    Cervicalgia    Fibromyalgia    GERD (gastroesophageal reflux disease)    not on medication   Hashimoto's disease    Headache(784.0)    sinus   Homonymous hemianopia    Hyperlipidemia    Hypertension    Hypothyroid    Other sequelae of cerebral infarction    PONV (postoperative nausea and vomiting)    06/20/14 patient reported that BP dropped real low and she had N/V  and was slow to awaken when she had surgery at an outpatient center.   Pre-diabetes    Seasonal allergies    Sleep apnea    no CPAP   Stroke 2019   Vitamin D deficiency    Past Surgical History:  Procedure Laterality Date   ABDOMINAL HYSTERECTOMY  1995   APPENDECTOMY  1978   BACK SURGERY  06/29/2020   L5-S1   CERVICAL FUSION  2017   2 levels   DILATION AND CURETTAGE OF UTERUS     LUMBAR DISC SURGERY  10/03/2012   Dr Danielle Dess  2 Discectomy   LUMBAR DISC SURGERY  07/2012   LUMBAR FUSION  2015   Fusion (2 levels)   LUMBAR FUSION  2016   fusion 1 level.   LUMBAR FUSION  02/16/2013   Fusion lumbar.   THYROIDECTOMY  2006   TOTAL HIP ARTHROPLASTY Right 07/03/2021   Procedure: TOTAL HIP ARTHROPLASTY ANTERIOR APPROACH;  Surgeon: Sheral Apley, MD;  Location: [facility];  Service: Orthopedics;  Laterality: Right;   TUBAL LIGATION  1990    Family History  Problem Relation Age of Onset   Aneurysm Mother        brain   Heart attack Father 53   Cancer Paternal Grandmother        Breast   Hyperlipidemia Other    Hypertension Other    Diabetes Other    Social History   Socioeconomic History   Marital status:  Married     Spouse name: Caitlin Ho   Number of children: 1   Years of education: Not on file   Highest education level: Not on file  Occupational History   Occupation: Disabled  Tobacco Use   Smoking status: Never   Smokeless tobacco: Never  Vaping Use   Vaping Use: Never used  Substance and Sexual Activity   Alcohol use: No   Drug use: No   Sexual activity: Yes    Partners: Male    Birth control/protection: None  Other Topics Concern   Not on file  Social History Narrative   Not on file   Social Determinants of Health   Financial Resource Strain: Low Risk  (04/01/2022)   Overall Financial Resource Strain (CARDIA)    Difficulty of Paying Living Expenses: Not hard at all  Food Insecurity: No Food Insecurity (04/01/2022)   Hunger Vital Sign    Worried About Running Out of Food in the Last Year: Never true    Ran Out of Food in the Last Year: Never true  Transportation Needs: No Transportation Needs (04/01/2022)   PRAPARE - Administrator, Civil Service (Medical): No    Lack of Transportation (Non-Medical): No  Physical Activity: Inactive (04/01/2022)   Exercise Vital Sign    Days of Exercise per Week: 0 days    Minutes of Exercise per Session: 0 min  Stress: No Stress Concern Present (04/01/2022)   [employer] of Occupational Health - Occupational Stress Questionnaire    Feeling of Stress : Not at all  Social Connections: Socially Integrated (04/01/2022)   Social Connection and Isolation Panel [NHANES]    Frequency of Communication with Friends and Family: More than three times a week    Frequency of Social Gatherings with Friends and Family: Once a week    Attends Religious Services: More than 4 times per year    Active Member of [employer] or Organizations: Yes    Attends Engineer, structural: More than 4 times per year    Marital Status: Married    Objective:  BP 124/68   Pulse 69   Temp (!) 96.8 F (36 C)   Ht 5\' 5"  (1.651 m)   Wt 212 lb (96.2 kg)   LMP   (LMP Unknown)   SpO2 93%   BMI 35.28 kg/m      06/03/2022    9:34 AM 04/01/2022    2:15 PM 02/27/2022    8:46 AM  BP/Weight  Systolic BP 124 112 118  Diastolic BP 68 72 68  Wt. (Lbs) 212 213.2 212  BMI 35.28 kg/m2 35.48 kg/m2 35.28 kg/m2    Physical Exam Vitals reviewed.  Constitutional:      Appearance: Normal appearance. She is normal weight.  Neck:     Vascular: No carotid bruit.  Cardiovascular:     Rate and Rhythm: Normal rate and regular rhythm.     Heart sounds: Normal heart sounds.  Pulmonary:     Effort: Pulmonary effort is normal. No respiratory distress.     Breath sounds: Normal breath sounds.  Abdominal:     General: Abdomen is flat. Bowel sounds are normal.     Palpations: Abdomen is soft.     Tenderness: There is no abdominal tenderness.  Musculoskeletal:        General: Tenderness (lumbar) present.  Neurological:     Mental Status: She is alert and oriented to person, place, and time.  Psychiatric:  Mood and Affect: Mood normal.        Behavior: Behavior normal.     Diabetic Foot Exam - Simple   No data filed      Lab Results  Component Value Date   WBC 9.5 06/03/2022   HGB 14.0 06/03/2022   HCT 42.5 06/03/2022   PLT 354 06/03/2022   GLUCOSE 109 (H) 06/03/2022   CHOL 176 06/03/2022   TRIG 144 06/03/2022   HDL 49 06/03/2022   LDLCALC 102 (H) 06/03/2022   ALT 15 06/03/2022   AST 21 06/03/2022   NA 145 (H) 06/03/2022   K 5.5 (H) 06/03/2022   CL 107 (H) 06/03/2022   CREATININE 0.92 06/03/2022   BUN 22 06/03/2022   CO2 24 06/03/2022   TSH 3.730 02/27/2022   HGBA1C 6.4 (H) 06/03/2022      Assessment & Plan:    Essential hypertension, benign Assessment & Plan: Well controlled.  No changes to medicines. Currently taking Metoprolol 25 mg daily. Continue to work on eating a healthy diet and exercise.    Orders: -     Comprehensive metabolic panel  Secondary hypothyroidism Assessment & Plan: Previously well  controlled Continue Synthroid at current dose     Mixed hyperlipidemia Assessment & Plan: Well controlled.  No changes to medicines. Fenofibrate 160 mg daily. Continue to work on eating a healthy diet and exercise.    Orders: -     Lipid panel  Prediabetes Assessment & Plan: Hemoglobin A1c 6.1%, 3 month avg of blood sugars, is in prediabetic range.  In order to prevent progression to diabetes, recommend low carb diet and regular exercise   Orders: -     CBC with Differential/Platelet -     Hemoglobin A1c  Gastroesophageal reflux disease, unspecified whether esophagitis present Assessment & Plan: The current medical regimen is effective;  continue present plan and medications.    Vitamin D insufficiency Assessment & Plan: The current medical regimen is effective;  continue present plan and medications.   Orders: -     VITAMIN D 25 Hydroxy (Vit-D Deficiency, Fractures)  Other orders -     Celecoxib; Take 1 capsule (200 mg total) by mouth daily.  Dispense: 90 capsule; Refill: 1 -     traMADol HCl; Take 1 tablet (50 mg total) by mouth every 12 (twelve) hours as needed. Take 2 tablets by mouth twice daily  Dispense: 120 tablet; Refill: 0 -     Cardiovascular Risk Assessment     Meds ordered this encounter  Medications   celecoxib (CELEBREX) 200 MG capsule    Sig: Take 1 capsule (200 mg total) by mouth daily.    Dispense:  90 capsule    Refill:  1   traMADol (ULTRAM) 50 MG tablet    Sig: Take 1 tablet (50 mg total) by mouth every 12 (twelve) hours as needed. Take 2 tablets by mouth twice daily    Dispense:  120 tablet    Refill:  0    Orders Placed This Encounter  Procedures   CBC with Differential/Platelet   Comprehensive metabolic panel   Lipid panel   Hemoglobin A1c   VITAMIN D 25 Hydroxy (Vit-D Deficiency, Fractures)   Cardiovascular Risk Assessment     Follow-up: No follow-ups on file.   I,Katherina A Bramblett,acting as a scribe for Blane Ohara,  MD.,have documented all relevant documentation on the behalf of Blane Ohara, MD,as directed by  Blane Ohara, MD while in the presence of Derwood Becraft,  MD.   Clayborn BignessI,Marla I Leal-Borjas,acting as a scribe for Blane OharaKirsten Azoria Abbett, MD.,have documented all relevant documentation on the behalf of Blane OharaKirsten Ott Zimmerle, MD,as directed by  Blane OharaKirsten Kendyn Zaman, MD while in the presence of Blane OharaKirsten Jerrit Horen, MD.    An After Visit Summary was printed and given to the patient.  I attest that I have reviewed this visit and agree with the plan scribed by my staff.   Blane OharaKirsten Binnie Droessler, MD [facility] [phone]

## 2022-06-04 LAB — COMPREHENSIVE METABOLIC PANEL
ALT: 15 IU/L (ref 0–32)
AST: 21 IU/L (ref 0–40)
Albumin/Globulin Ratio: 1.9 (ref 1.2–2.2)
Albumin: 4.1 g/dL (ref 3.9–4.9)
Alkaline Phosphatase: 54 IU/L (ref 44–121)
BUN/Creatinine Ratio: 24 (ref 12–28)
BUN: 22 mg/dL (ref 8–27)
Bilirubin Total: 0.3 mg/dL (ref 0.0–1.2)
CO2: 24 mmol/L (ref 20–29)
Calcium: 9.8 mg/dL (ref 8.7–10.3)
Chloride: 107 mmol/L — ABNORMAL HIGH (ref 96–106)
Creatinine, Ser: 0.92 mg/dL (ref 0.57–1.00)
Globulin, Total: 2.2 g/dL (ref 1.5–4.5)
Glucose: 109 mg/dL — ABNORMAL HIGH (ref 70–99)
Potassium: 5.5 mmol/L — ABNORMAL HIGH (ref 3.5–5.2)
Sodium: 145 mmol/L — ABNORMAL HIGH (ref 134–144)
Total Protein: 6.3 g/dL (ref 6.0–8.5)
eGFR: 70 mL/min/{1.73_m2} (ref >59)

## 2022-06-04 LAB — LIPID PANEL
Chol/HDL Ratio: 3.6 ratio (ref 0.0–4.4)
Cholesterol, Total: 176 mg/dL (ref 100–199)
HDL: 49 mg/dL (ref >39)
LDL Chol Calc (NIH): 102 mg/dL — ABNORMAL HIGH (ref 0–99)
Triglycerides: 144 mg/dL (ref 0–149)
VLDL Cholesterol Cal: 25 mg/dL (ref 5–40)

## 2022-06-04 LAB — CBC WITH DIFFERENTIAL/PLATELET
Basophils Absolute: 0.1 10*3/uL (ref 0.0–0.2)
Basos: 2 %
EOS (ABSOLUTE): 0.3 10*3/uL (ref 0.0–0.4)
Eos: 3 %
Hematocrit: 42.5 % (ref 34.0–46.6)
Hemoglobin: 14 g/dL (ref 11.1–15.9)
Immature Grans (Abs): 0.1 10*3/uL (ref 0.0–0.1)
Immature Granulocytes: 1 %
Lymphocytes Absolute: 3.5 10*3/uL — ABNORMAL HIGH (ref 0.7–3.1)
Lymphs: 37 %
MCH: 29.7 pg (ref 26.6–33.0)
MCHC: 32.9 g/dL (ref 31.5–35.7)
MCV: 90 fL (ref 79–97)
Monocytes Absolute: 0.7 10*3/uL (ref 0.1–0.9)
Monocytes: 7 %
Neutrophils Absolute: 4.7 10*3/uL (ref 1.4–7.0)
Neutrophils: 50 %
Platelets: 354 10*3/uL (ref 150–450)
RBC: 4.72 x10E6/uL (ref 3.77–5.28)
RDW: 12.1 % (ref 11.7–15.4)
WBC: 9.5 10*3/uL (ref 3.4–10.8)

## 2022-06-04 LAB — CARDIOVASCULAR RISK ASSESSMENT

## 2022-06-04 LAB — HEMOGLOBIN A1C
Est. average glucose Bld gHb Est-mCnc: 137 mg/dL
Hgb A1c MFr Bld: 6.4 % — ABNORMAL HIGH (ref 4.8–5.6)

## 2022-06-04 LAB — VITAMIN D 25 HYDROXY (VIT D DEFICIENCY, FRACTURES): Vit D, 25-Hydroxy: 47.4 ng/mL (ref 30.0–100.0)

## 2022-06-05 ENCOUNTER — Other Ambulatory Visit: Payer: Self-pay

## 2022-06-05 ENCOUNTER — Encounter: Payer: Self-pay | Admitting: Family Medicine

## 2022-06-05 DIAGNOSIS — E038 Other specified hypothyroidism: Secondary | ICD-10-CM

## 2022-06-05 DIAGNOSIS — E875 Hyperkalemia: Secondary | ICD-10-CM

## 2022-06-06 NOTE — Assessment & Plan Note (Addendum)
Well controlled.  No changes to medicines. Fenofibrate 160 mg daily. Continue to work on eating a healthy diet and exercise.

## 2022-06-06 NOTE — Assessment & Plan Note (Signed)
The current medical regimen is effective;  continue present plan and medications.  

## 2022-06-06 NOTE — Assessment & Plan Note (Signed)
Hemoglobin A1c 6.1%, 3 month avg of blood sugars, is in prediabetic range.  In order to prevent progression to diabetes, recommend low carb diet and regular exercise  

## 2022-06-06 NOTE — Assessment & Plan Note (Addendum)
Previously well controlled Continue Synthroid at current dose  

## 2022-06-06 NOTE — Assessment & Plan Note (Addendum)
Well controlled.  No changes to medicines. Currently taking Metoprolol 25 mg daily. Continue to work on eating a healthy diet and exercise.

## 2022-06-07 MED ORDER — TRAMADOL HCL 50 MG PO TABS
50.0000 mg | ORAL_TABLET | Freq: Two times a day (BID) | ORAL | 0 refills | Status: DC | PRN
Start: 2022-06-07 — End: 2022-06-12

## 2022-06-07 MED ORDER — CELECOXIB 200 MG PO CAPS: 200.0000 mg | ORAL_CAPSULE | Freq: Every day | ORAL | 1 refills | Status: DC

## 2022-06-11 ENCOUNTER — Other Ambulatory Visit: Payer: Self-pay

## 2022-06-12 ENCOUNTER — Other Ambulatory Visit: Payer: Self-pay

## 2022-06-12 MED ORDER — TRAMADOL HCL 50 MG PO TABS: ORAL_TABLET | ORAL | 2 refills | Status: DC

## 2022-06-28 LAB — T4, FREE: Free T4: 2.29 ng/dL — ABNORMAL HIGH (ref 0.82–1.77)

## 2022-06-28 LAB — SPECIMEN STATUS REPORT

## 2022-06-28 LAB — TSH: TSH: 2.28 u[IU]/mL (ref 0.450–4.500)

## 2022-07-01 ENCOUNTER — Other Ambulatory Visit: Payer: Self-pay

## 2022-07-01 DIAGNOSIS — E038 Other specified hypothyroidism: Secondary | ICD-10-CM

## 2022-07-01 MED ORDER — LEVOTHYROXINE SODIUM 112 MCG PO TABS: 112.0000 ug | ORAL_TABLET | Freq: Every day | ORAL | 1 refills | Status: DC

## 2022-07-03 DIAGNOSIS — M1611 Unilateral primary osteoarthritis, right hip: Secondary | ICD-10-CM | POA: Diagnosis not present

## 2022-07-12 DIAGNOSIS — Z6834 Body mass index (BMI) 34.0-34.9, adult: Secondary | ICD-10-CM | POA: Diagnosis not present

## 2022-07-12 DIAGNOSIS — M546 Pain in thoracic spine: Secondary | ICD-10-CM | POA: Diagnosis not present

## 2022-07-29 ENCOUNTER — Other Ambulatory Visit: Payer: Self-pay | Admitting: Family Medicine

## 2022-07-30 DIAGNOSIS — M5114 Intervertebral disc disorders with radiculopathy, thoracic region: Secondary | ICD-10-CM | POA: Diagnosis not present

## 2022-07-30 DIAGNOSIS — M5414 Radiculopathy, thoracic region: Secondary | ICD-10-CM | POA: Diagnosis not present

## 2022-08-13 ENCOUNTER — Other Ambulatory Visit: Payer: Medicare HMO

## 2022-08-21 ENCOUNTER — Other Ambulatory Visit: Payer: Self-pay | Admitting: Family Medicine

## 2022-08-26 ENCOUNTER — Other Ambulatory Visit: Payer: Self-pay | Admitting: Family Medicine

## 2022-09-08 ENCOUNTER — Other Ambulatory Visit: Payer: Self-pay | Admitting: Family Medicine

## 2022-09-10 DIAGNOSIS — M4804 Spinal stenosis, thoracic region: Secondary | ICD-10-CM | POA: Diagnosis not present

## 2022-09-10 DIAGNOSIS — R531 Weakness: Secondary | ICD-10-CM | POA: Diagnosis not present

## 2022-09-10 DIAGNOSIS — M546 Pain in thoracic spine: Secondary | ICD-10-CM | POA: Diagnosis not present

## 2022-09-10 DIAGNOSIS — M5134 Other intervertebral disc degeneration, thoracic region: Secondary | ICD-10-CM | POA: Diagnosis not present

## 2022-09-13 ENCOUNTER — Other Ambulatory Visit: Payer: Self-pay | Admitting: Family Medicine

## 2022-09-13 DIAGNOSIS — M545 Low back pain, unspecified: Secondary | ICD-10-CM

## 2022-09-19 DIAGNOSIS — M546 Pain in thoracic spine: Secondary | ICD-10-CM | POA: Diagnosis not present

## 2022-09-19 DIAGNOSIS — Z6835 Body mass index (BMI) 35.0-35.9, adult: Secondary | ICD-10-CM | POA: Diagnosis not present

## 2022-09-22 NOTE — Assessment & Plan Note (Signed)
Well controlled.  No changes to medicines. Restart Fenofibrate 160 mg daily. Continue to work on eating a healthy diet and exercise.

## 2022-09-22 NOTE — Assessment & Plan Note (Signed)
Well controlled.  No changes to medicines. Currently taking Metoprolol 25 mg daily. Continue to work on eating a healthy diet and exercise.

## 2022-09-22 NOTE — Assessment & Plan Note (Signed)
The current medical regimen is effective;  continue present plan and medications.

## 2022-09-22 NOTE — Assessment & Plan Note (Signed)
Previously well controlled Continue Synthroid at current dose  Labs draw today.

## 2022-09-22 NOTE — Assessment & Plan Note (Signed)
Hemoglobin A1c 6.4%, 3 month avg of blood sugars, is in prediabetic range.  In order to prevent progression to diabetes, recommend low carb diet and regular exercise  

## 2022-09-22 NOTE — Progress Notes (Unsigned)
Subjective:  Patient ID: Caitlin Ho, female    DOB: March 24, 1955  Age: 67 y.o. MRN: [id number]  Chief Complaint  Patient presents with   Medical Management of Chronic Issues    HPI   Hypertension:  Metoprolol tartrate 25 mg twice daily. Furosemide 40 mg one daily. History of stroke - on aspirin 81 mg daily.  Hyperlipidemia:  Fenofibrate 160 mg daily.  Eating healthy.  Hypothyroidism:  Levothyroxine 125 mcg daily.   GERD:  Omeprazole 40 mg daily.   Chronic back pain. Sees Dr. Danielle Dess. On mobic 15 mg daily (but not very effective, advil causes upset stomach), robaxin and tramadol (2 BID) Esi injections in February which helped for approx 2 week.     06/03/2022    9:35 AM 04/01/2022    2:05 PM 05/10/2021    2:01 PM 02/15/2020    4:21 PM 05/24/2019   12:41 PM  Depression screen PHQ 2/9  Decreased Interest 0 0 0 0 0  Down, Depressed, Hopeless 0 0 0 0 0  PHQ - 2 Score 0 0 0 0 0  Altered sleeping    0   Tired, decreased energy    0   Change in appetite    0   Feeling bad or failure about yourself     0   Trouble concentrating    0   Moving slowly or fidgety/restless    0   Suicidal thoughts    0   PHQ-9 Score    0   Difficult doing work/chores    Not difficult at all         06/03/2022    9:35 AM  Fall Risk   Falls in the past year? 0  Number falls in past yr: 0  Injury with Fall? 0  Risk for fall due to : No Fall Risks  Follow up Falls evaluation completed    Patient Care Team: Blane Ohara, MD as PCP - General (Family Medicine) Albin Felling, OD (Optometry) Barnett Abu, MD as Consulting Physician (Neurosurgery) Specialists, [facility] (Orthopedic Surgery)   Review of Systems  Current Outpatient Medications on File Prior to Visit  Medication Sig Dispense Refill   acetaminophen (TYLENOL) 500 MG tablet Take 2 tablets (1,000 mg total) by mouth every 6 (six) hours as needed for mild pain or moderate pain. 60 tablet 0   aspirin EC 81 MG tablet Take 1 tablet  (81 mg total) by mouth daily. 30 tablet 11   celecoxib (CELEBREX) 200 MG capsule TAKE 1 CAPSULE EVERY DAY 90 capsule 3   cetirizine (ZYRTEC) 10 MG tablet Take 10 mg by mouth daily.     fenofibrate 160 MG tablet TAKE 1 TABLET EVERY DAY 90 tablet 1   furosemide (LASIX) 40 MG tablet Take 1 tablet (40 mg total) by mouth daily. 90 tablet 1   gabapentin (NEURONTIN) 600 MG tablet TAKE 1 TABLET THREE TIMES DAILY 270 tablet 3   glucosamine-chondroitin 500-400 MG tablet Take 1 tablet by mouth daily. (Patient not taking: Reported on 04/01/2022)     levothyroxine (SYNTHROID) 112 MCG tablet TAKE 1 TABLET BY MOUTH DAILY BEFORE BREAKFAST. 90 tablet 0   meloxicam (MOBIC) 15 MG tablet Take 1 tablet (15 mg total) by mouth daily as needed for pain (and inflammation). 90 tablet 3   methocarbamol (ROBAXIN) 500 MG tablet TAKE 1 TABLET THREE TIMES DAILY 270 tablet 3   metoprolol tartrate (LOPRESSOR) 25 MG tablet TAKE 1 TABLET TWICE DAILY 180 tablet  1   omeprazole (PRILOSEC) 40 MG capsule TAKE 1 CAPSULE EVERY DAY 90 capsule 3   polyethylene glycol (MIRALAX / GLYCOLAX) 17 g packet Take 17 g by mouth daily as needed for moderate constipation.     traMADol (ULTRAM) 50 MG tablet TAKE 1 TABLET EVERY 12 HOURS AS NEEDED 60 tablet 2   Vitamin D, Ergocalciferol, (DRISDOL) 1.25 MG (50000 UNIT) CAPS capsule TAKE 1 CAPSULE (50,000 UNITS TOTAL) BY MOUTH EVERY 7 (SEVEN) DAYS 12 capsule 1   No current facility-administered medications on file prior to visit.   Past Medical History:  Diagnosis Date   Cancer (HCC)    cervical cancer cells whwn patient was 22    Cervicalgia    Fibromyalgia    GERD (gastroesophageal reflux disease)    not on medication   Hashimoto's disease    Headache(784.0)    sinus   Homonymous hemianopia    Hyperlipidemia    Hypertension    Hypothyroid    Other sequelae of cerebral infarction    PONV (postoperative nausea and vomiting)    06/20/14 patient reported that BP dropped real low and she had N/V   and was slow to awaken when she had surgery at an outpatient center.   Pre-diabetes    Seasonal allergies    Sleep apnea    no CPAP   Stroke (HCC) 2019   Vitamin D deficiency    Past Surgical History:  Procedure Laterality Date   ABDOMINAL HYSTERECTOMY  1995   APPENDECTOMY  1978   BACK SURGERY  06/29/2020   L5-S1   CERVICAL FUSION  2017   2 levels   DILATION AND CURETTAGE OF UTERUS     LUMBAR DISC SURGERY  10/03/2012   Dr Danielle Dess  2 Discectomy   LUMBAR DISC SURGERY  07/2012   LUMBAR FUSION  2015   Fusion (2 levels)   LUMBAR FUSION  2016   fusion 1 level.   LUMBAR FUSION  02/16/2013   Fusion lumbar.   THYROIDECTOMY  2006   TOTAL HIP ARTHROPLASTY Right 07/03/2021   Procedure: TOTAL HIP ARTHROPLASTY ANTERIOR APPROACH;  Surgeon: Sheral Apley, MD;  Location: [facility];  Service: Orthopedics;  Laterality: Right;   TUBAL LIGATION  1990    Family History  Problem Relation Age of Onset   Aneurysm Mother        brain   Heart attack Father 19   Cancer Paternal Grandmother        Breast   Hyperlipidemia Other    Hypertension Other    Diabetes Other    Social History   Socioeconomic History   Marital status: Married    Spouse name: Peyton Najjar   Number of children: 1   Years of education: Not on file   Highest education level: Not on file  Occupational History   Occupation: Disabled  Tobacco Use   Smoking status: Never   Smokeless tobacco: Never  Vaping Use   Vaping status: Never Used  Substance and Sexual Activity   Alcohol use: No   Drug use: No   Sexual activity: Yes    Partners: Male    Birth control/protection: None  Other Topics Concern   Not on file  Social History Narrative   Not on file   Social Determinants of Health   Financial Resource Strain: Low Risk  (04/01/2022)   Overall Financial Resource Strain (CARDIA)    Difficulty of Paying Living Expenses: Not hard at all  Food Insecurity: No Food Insecurity (  04/01/2022)   Hunger Vital Sign    Worried About  Running Out of Food in the Last Year: Never true    Ran Out of Food in the Last Year: Never true  Transportation Needs: No Transportation Needs (04/01/2022)   PRAPARE - Administrator, Civil Service (Medical): No    Lack of Transportation (Non-Medical): No  Physical Activity: Inactive (04/01/2022)   Exercise Vital Sign    Days of Exercise per Week: 0 days    Minutes of Exercise per Session: 0 min  Stress: No Stress Concern Present (04/01/2022)   [employer] of Occupational Health - Occupational Stress Questionnaire    Feeling of Stress : Not at all  Social Connections: Socially Integrated (04/01/2022)   Social Connection and Isolation Panel [NHANES]    Frequency of Communication with Friends and Family: More than three times a week    Frequency of Social Gatherings with Friends and Family: Once a week    Attends Religious Services: More than 4 times per year    Active Member of Clubs or Organizations: Yes    Attends Banker Meetings: More than 4 times per year    Marital Status: Married    Objective:  LMP  (LMP Unknown)      06/03/2022    9:34 AM 04/01/2022    2:15 PM 02/27/2022    8:46 AM  BP/Weight  Systolic BP 124 112 118  Diastolic BP 68 72 68  Wt. (Lbs) 212 213.2 212  BMI 35.28 kg/m2 35.48 kg/m2 35.28 kg/m2    Physical Exam  Diabetic Foot Exam - Simple   No data filed      Lab Results  Component Value Date   WBC 9.5 06/03/2022   HGB 14.0 06/03/2022   HCT 42.5 06/03/2022   PLT 354 06/03/2022   GLUCOSE 109 (H) 06/03/2022   CHOL 176 06/03/2022   TRIG 144 06/03/2022   HDL 49 06/03/2022   LDLCALC 102 (H) 06/03/2022   ALT 15 06/03/2022   AST 21 06/03/2022   NA 145 (H) 06/03/2022   K 5.5 (H) 06/03/2022   CL 107 (H) 06/03/2022   CREATININE 0.92 06/03/2022   BUN 22 06/03/2022   CO2 24 06/03/2022   TSH 2.280 06/03/2022   HGBA1C 6.4 (H) 06/03/2022      Assessment & Plan:    Essential hypertension, benign Assessment &  Plan: Well controlled.  No changes to medicines. Currently taking Metoprolol 25 mg daily. Continue to work on eating a healthy diet and exercise.     Gastroesophageal reflux disease without esophagitis Assessment & Plan: The current medical regimen is effective;  continue present plan and medications.    Secondary hypothyroidism Assessment & Plan: Previously well controlled Continue Synthroid at current dose     Prediabetes Assessment & Plan: Hemoglobin A1c 6.4%, 3 month avg of blood sugars, is in prediabetic range.  In order to prevent progression to diabetes, recommend low carb diet and regular exercise    Mixed hyperlipidemia Assessment & Plan: Well controlled.  No changes to medicines. Fenofibrate 160 mg daily. Continue to work on eating a healthy diet and exercise.        No orders of the defined types were placed in this encounter.   No orders of the defined types were placed in this encounter.    Follow-up: No follow-ups on file.   Clayborn Bigness I Leal-Borjas,acting as a scribe for Blane Ohara, MD.,have documented all relevant documentation on the behalf of  Blane Ohara, MD,as directed by  Blane Ohara, MD while in the presence of Blane Ohara, MD.   An After Visit Summary was printed and given to the patient.  Blane Ohara, MD [facility] [phone]

## 2022-09-23 ENCOUNTER — Ambulatory Visit (INDEPENDENT_AMBULATORY_CARE_PROVIDER_SITE_OTHER): Payer: Medicare HMO | Admitting: Family Medicine

## 2022-09-23 ENCOUNTER — Encounter: Payer: Self-pay | Admitting: Family Medicine

## 2022-09-23 VITALS — BP 114/60 | HR 70 | Temp 97.0°F | Resp 18 | Ht 65.0 in | Wt 219.0 lb

## 2022-09-23 DIAGNOSIS — Z1231 Encounter for screening mammogram for malignant neoplasm of breast: Secondary | ICD-10-CM | POA: Diagnosis not present

## 2022-09-23 DIAGNOSIS — E782 Mixed hyperlipidemia: Secondary | ICD-10-CM

## 2022-09-23 DIAGNOSIS — Z23 Encounter for immunization: Secondary | ICD-10-CM | POA: Diagnosis not present

## 2022-09-23 DIAGNOSIS — R7303 Prediabetes: Secondary | ICD-10-CM

## 2022-09-23 DIAGNOSIS — I10 Essential (primary) hypertension: Secondary | ICD-10-CM

## 2022-09-23 DIAGNOSIS — M48062 Spinal stenosis, lumbar region with neurogenic claudication: Secondary | ICD-10-CM

## 2022-09-23 DIAGNOSIS — K219 Gastro-esophageal reflux disease without esophagitis: Secondary | ICD-10-CM | POA: Diagnosis not present

## 2022-09-23 DIAGNOSIS — Z13820 Encounter for screening for osteoporosis: Secondary | ICD-10-CM

## 2022-09-23 DIAGNOSIS — Z780 Asymptomatic menopausal state: Secondary | ICD-10-CM

## 2022-09-23 DIAGNOSIS — Z6836 Body mass index (BMI) 36.0-36.9, adult: Secondary | ICD-10-CM

## 2022-09-23 DIAGNOSIS — E038 Other specified hypothyroidism: Secondary | ICD-10-CM | POA: Diagnosis not present

## 2022-09-23 MED ORDER — TRAMADOL HCL 100 MG PO TABS: 100.0000 mg | ORAL_TABLET | Freq: Three times a day (TID) | ORAL | 2 refills | Status: DC | PRN

## 2022-09-23 NOTE — Patient Instructions (Addendum)
Restart Fenofibrate as previously prescribed.   Increase Tramadol 100 mg three times daily as needed for pain. Bone Density ordered today. Mammogram ordered today.

## 2022-09-23 NOTE — Assessment & Plan Note (Signed)
Increase tramadol 100 mg three times daily as needed. Use vicodin sparingly as ordered per Dr. Danielle Dess.

## 2022-09-25 ENCOUNTER — Telehealth: Payer: Self-pay

## 2022-09-25 ENCOUNTER — Other Ambulatory Visit: Payer: Self-pay | Admitting: Family Medicine

## 2022-09-25 DIAGNOSIS — M5416 Radiculopathy, lumbar region: Secondary | ICD-10-CM | POA: Diagnosis not present

## 2022-09-25 DIAGNOSIS — Z23 Encounter for immunization: Secondary | ICD-10-CM

## 2022-09-25 DIAGNOSIS — M545 Low back pain, unspecified: Secondary | ICD-10-CM | POA: Diagnosis not present

## 2022-09-25 DIAGNOSIS — Z13820 Encounter for screening for osteoporosis: Secondary | ICD-10-CM | POA: Insufficient documentation

## 2022-09-25 DIAGNOSIS — Z981 Arthrodesis status: Secondary | ICD-10-CM | POA: Diagnosis not present

## 2022-09-25 DIAGNOSIS — Z1231 Encounter for screening mammogram for malignant neoplasm of breast: Secondary | ICD-10-CM

## 2022-09-25 DIAGNOSIS — Z01810 Encounter for preprocedural cardiovascular examination: Secondary | ICD-10-CM | POA: Insufficient documentation

## 2022-09-25 DIAGNOSIS — M4606 Spinal enthesopathy, lumbar region: Secondary | ICD-10-CM | POA: Diagnosis not present

## 2022-09-25 DIAGNOSIS — M48061 Spinal stenosis, lumbar region without neurogenic claudication: Secondary | ICD-10-CM | POA: Diagnosis not present

## 2022-09-25 HISTORY — DX: Encounter for immunization: Z23

## 2022-09-25 HISTORY — DX: Encounter for screening mammogram for malignant neoplasm of breast: Z12.31

## 2022-09-25 HISTORY — DX: Encounter for screening for osteoporosis: Z13.820

## 2022-09-25 MED ORDER — TRAMADOL HCL 50 MG PO TABS: 100.0000 mg | ORAL_TABLET | Freq: Three times a day (TID) | ORAL | 0 refills | Status: DC | PRN

## 2022-09-25 NOTE — Progress Notes (Signed)
Subjective:  Patient ID: Caitlin Ho Comment, female    DOB: December 06, 1955  Age: 67 y.o. MRN: [id number]  Chief Complaint  Patient presents with   Medical Management of Chronic Issues   HPI: Hypertension:  Metoprolol tartrate 25 mg twice daily and Furosemide 40 mg one daily. History of stroke - on aspirin 81 mg daily.  Hyperlipidemia:  Patient is not taking Fenofibrate 160 mg daily.  She was hoping it would help her pain, but did not so she is willing to restart it. Eating healthy.  Hypothyroidism:  Levothyroxine 112 mcg daily.   GERD:  Omeprazole 40 mg daily.   Chronic back pain. Sees Dr. Danielle Dess. On mobic 15 mg daily (but not very effective, advil causes upset stomach), robaxin 500 mg three times a day and tramadol 50 mg 2 oral twice daily. Gabapentin 600 mg three times a day.  Vitamin D deficiency: on vitamin D 50K weekly.      09/23/2022    9:33 AM 06/03/2022    9:35 AM 04/01/2022    2:05 PM 05/10/2021    2:01 PM 02/15/2020    4:21 PM  Depression screen PHQ 2/9  Decreased Interest 0 0 0 0 0  Down, Depressed, Hopeless 0 0 0 0 0  PHQ - 2 Score 0 0 0 0 0  Altered sleeping 0    0  Tired, decreased energy 1    0  Change in appetite 0    0  Feeling bad or failure about yourself  0    0  Trouble concentrating 0    0  Moving slowly or fidgety/restless 0    0  Suicidal thoughts 0    0  PHQ-9 Score 1    0  Difficult doing work/chores Not difficult at all    Not difficult at all         05/10/2021    2:01 PM 07/03/2021    8:35 AM 04/01/2022    2:05 PM 06/03/2022    9:35 AM 09/23/2022    9:32 AM  Fall Risk  Falls in the past year? 0  0 0 0  Was there an injury with Fall? 0  0 0 0  Fall Risk Category Calculator 0  0 0 0  Fall Risk Category (Retired) Low      (RETIRED) Patient Fall Risk Level Low fall risk Moderate fall risk     Patient at Risk for Falls Due to No Fall Risks  No Fall Risks No Fall Risks Impaired balance/gait;Impaired mobility  Fall risk Follow up Falls evaluation completed  Falls  evaluation completed;Education provided Falls evaluation completed Falls evaluation completed;Falls prevention discussed      Review of Systems  Constitutional:  Negative for chills, fatigue and fever.  HENT:  Negative for congestion, ear pain, rhinorrhea and sore throat.   Respiratory:  Negative for cough and shortness of breath.   Cardiovascular:  Negative for chest pain.  Gastrointestinal:  Negative for abdominal pain, constipation, diarrhea, nausea and vomiting.  Genitourinary:  Negative for dysuria and urgency.  Musculoskeletal:  Negative for back pain and myalgias.  Neurological:  Positive for headaches. Negative for dizziness, weakness and light-headedness.  Psychiatric/Behavioral:  Negative for dysphoric mood. The patient is not nervous/anxious.     Current Outpatient Medications on File Prior to Visit  Medication Sig Dispense Refill   HYDROcodone-acetaminophen (NORCO/VICODIN) 5-325 MG tablet Take 1 tablet by mouth 3 (three) times daily as needed for severe pain.     acetaminophen (  TYLENOL) 500 MG tablet Take 2 tablets (1,000 mg total) by mouth every 6 (six) hours as needed for mild pain or moderate pain. 60 tablet 0   aspirin EC 81 MG tablet Take 1 tablet (81 mg total) by mouth daily. 30 tablet 11   celecoxib (CELEBREX) 200 MG capsule TAKE 1 CAPSULE EVERY DAY 90 capsule 3   cetirizine (ZYRTEC) 10 MG tablet Take 10 mg by mouth daily.     fenofibrate 160 MG tablet TAKE 1 TABLET EVERY DAY (Patient not taking: Reported on 09/23/2022) 90 tablet 1   furosemide (LASIX) 40 MG tablet Take 1 tablet (40 mg total) by mouth daily. 90 tablet 1   gabapentin (NEURONTIN) 600 MG tablet TAKE 1 TABLET THREE TIMES DAILY 270 tablet 3   levothyroxine (SYNTHROID) 112 MCG tablet TAKE 1 TABLET BY MOUTH DAILY BEFORE BREAKFAST. 90 tablet 0   methocarbamol (ROBAXIN) 500 MG tablet TAKE 1 TABLET THREE TIMES DAILY 270 tablet 3   metoprolol tartrate (LOPRESSOR) 25 MG tablet TAKE 1 TABLET TWICE DAILY 180 tablet 1    omeprazole (PRILOSEC) 40 MG capsule TAKE 1 CAPSULE EVERY DAY 90 capsule 3   polyethylene glycol (MIRALAX / GLYCOLAX) 17 g packet Take 17 g by mouth daily as needed for moderate constipation.     Vitamin D, Ergocalciferol, (DRISDOL) 1.25 MG (50000 UNIT) CAPS capsule TAKE 1 CAPSULE (50,000 UNITS TOTAL) BY MOUTH EVERY 7 (SEVEN) DAYS 12 capsule 1   No current facility-administered medications on file prior to visit.   Past Medical History:  Diagnosis Date   Cancer (HCC)    cervical cancer cells whwn patient was 22    Cervicalgia    Fibromyalgia    GERD (gastroesophageal reflux disease)    not on medication   Hashimoto's disease    Headache(784.0)    sinus   Homonymous hemianopia    Hyperlipidemia    Hypertension    Hypothyroid    Other sequelae of cerebral infarction    PONV (postoperative nausea and vomiting)    06/20/14 patient reported that BP dropped real low and she had N/V  and was slow to awaken when she had surgery at an outpatient center.   Pre-diabetes    Seasonal allergies    Sleep apnea    no CPAP   Stroke (HCC) 2019   Vitamin D deficiency    Past Surgical History:  Procedure Laterality Date   ABDOMINAL HYSTERECTOMY  1995   APPENDECTOMY  1978   BACK SURGERY  06/29/2020   L5-S1   CERVICAL FUSION  2017   2 levels   DILATION AND CURETTAGE OF UTERUS     LUMBAR DISC SURGERY  10/03/2012   Dr Danielle Dess  2 Discectomy   LUMBAR DISC SURGERY  07/2012   LUMBAR FUSION  2015   Fusion (2 levels)   LUMBAR FUSION  2016   fusion 1 level.   LUMBAR FUSION  02/16/2013   Fusion lumbar.   THYROIDECTOMY  2006   TOTAL HIP ARTHROPLASTY Right 07/03/2021   Procedure: TOTAL HIP ARTHROPLASTY ANTERIOR APPROACH;  Surgeon: Sheral Apley, MD;  Location: [facility];  Service: Orthopedics;  Laterality: Right;   TUBAL LIGATION  1990    Family History  Problem Relation Age of Onset   Aneurysm Mother        brain   Heart attack Father 23   Cancer Paternal Grandmother        Breast    Hyperlipidemia Other    Hypertension Other  Diabetes Other    Social History   Socioeconomic History   Marital status: Married    Spouse name: Peyton Najjar   Number of children: 1   Years of education: Not on file   Highest education level: Not on file  Occupational History   Occupation: Disabled  Tobacco Use   Smoking status: Never   Smokeless tobacco: Never  Vaping Use   Vaping status: Never Used  Substance and Sexual Activity   Alcohol use: No   Drug use: No   Sexual activity: Yes    Partners: Male    Birth control/protection: None  Other Topics Concern   Not on file  Social History Narrative   Not on file   Social Determinants of Health   Financial Resource Strain: Low Risk  (04/01/2022)   Overall Financial Resource Strain (CARDIA)    Difficulty of Paying Living Expenses: Not hard at all  Food Insecurity: No Food Insecurity (04/01/2022)   Hunger Vital Sign    Worried About Running Out of Food in the Last Year: Never true    Ran Out of Food in the Last Year: Never true  Transportation Needs: No Transportation Needs (04/01/2022)   PRAPARE - Administrator, Civil Service (Medical): No    Lack of Transportation (Non-Medical): No  Physical Activity: Inactive (04/01/2022)   Exercise Vital Sign    Days of Exercise per Week: 0 days    Minutes of Exercise per Session: 0 min  Stress: No Stress Concern Present (04/01/2022)   [employer] of Occupational Health - Occupational Stress Questionnaire    Feeling of Stress : Not at all  Social Connections: Socially Integrated (04/01/2022)   Social Connection and Isolation Panel [NHANES]    Frequency of Communication with Friends and Family: More than three times a week    Frequency of Social Gatherings with Friends and Family: Once a week    Attends Religious Services: More than 4 times per year    Active Member of [employer] or Organizations: Yes    Attends Engineer, structural: More than 4 times per year     Marital Status: Married    Objective:  BP 114/60   Pulse 70   Temp (!) 97 F (36.1 C)   Resp 18   Ht 5\' 5"  (1.651 m)   Wt 219 lb (99.3 kg)   LMP  (LMP Unknown)   BMI 36.44 kg/m      09/23/2022    9:30 AM 06/03/2022    9:34 AM 04/01/2022    2:15 PM  BP/Weight  Systolic BP 114 124 112  Diastolic BP 60 68 72  Wt. (Lbs) 219 212 213.2  BMI 36.44 kg/m2 35.28 kg/m2 35.48 kg/m2    Physical Exam Vitals reviewed.  Constitutional:      Appearance: Normal appearance. She is normal weight.  Neck:     Vascular: No carotid bruit.  Cardiovascular:     Rate and Rhythm: Normal rate and regular rhythm.     Heart sounds: Normal heart sounds.  Pulmonary:     Effort: Pulmonary effort is normal. No respiratory distress.     Breath sounds: Normal breath sounds.  Abdominal:     General: Abdomen is flat. Bowel sounds are normal.     Palpations: Abdomen is soft.     Tenderness: There is no abdominal tenderness.  Musculoskeletal:        General: Tenderness (lumbar) present.  Neurological:     Mental Status: She is  alert and oriented to person, place, and time.  Psychiatric:        Mood and Affect: Mood normal.        Behavior: Behavior normal.     Diabetic Foot Exam - Simple   Simple Foot Form Diabetic Foot exam was performed with the following findings: Yes 09/23/2022 10:01 AM  Visual Inspection No deformities, no ulcerations, no other skin breakdown bilaterally: Yes See comments: Yes Sensation Testing Intact to touch and monofilament testing bilaterally: Yes Pulse Check Posterior Tibialis and Dorsalis pulse intact bilaterally: Yes Comments Thickened nails.        Lab Results  Component Value Date   WBC 9.5 06/03/2022   HGB 14.0 06/03/2022   HCT 42.5 06/03/2022   PLT 354 06/03/2022   GLUCOSE 109 (H) 06/03/2022   CHOL 176 06/03/2022   TRIG 144 06/03/2022   HDL 49 06/03/2022   LDLCALC 102 (H) 06/03/2022   ALT 15 06/03/2022   AST 21 06/03/2022   NA 145 (H) 06/03/2022    K 5.5 (H) 06/03/2022   CL 107 (H) 06/03/2022   CREATININE 0.92 06/03/2022   BUN 22 06/03/2022   CO2 24 06/03/2022   TSH 2.280 06/03/2022   HGBA1C 6.4 (H) 06/03/2022      Assessment & Plan:    Essential hypertension, benign Assessment & Plan: Well controlled.  No changes to medicines. Currently taking Metoprolol 25 mg daily. Continue to work on eating a healthy diet and exercise.    Orders: -     CBC with Differential/Platelet -     Comprehensive metabolic panel  Gastroesophageal reflux disease without esophagitis Assessment & Plan: The current medical regimen is effective;  continue present plan and medications. Continue on Omeprazole 40 mg daily.     Secondary hypothyroidism Assessment & Plan: Previously well controlled Continue Synthroid at current dose  Labs draw today.    Orders: -     TSH  Prediabetes Assessment & Plan: Hemoglobin A1c 6.4%, 3 month avg of blood sugars, is in prediabetic range.  In order to prevent progression to diabetes, recommend low carb diet and regular exercise   Orders: -     Hemoglobin A1c  Mixed hyperlipidemia Assessment & Plan: Well controlled.  No changes to medicines. Restart Fenofibrate 160 mg daily. Continue to work on eating a healthy diet and exercise.    Orders: -     Lipid panel  Need for pneumococcal 20-valent conjugate vaccination -     Pneumococcal conjugate vaccine 20-valent  Encounter for screening mammogram for malignant neoplasm of breast -     3D Screening Mammogram, Left and Right; Future  Encounter for osteoporosis screening in asymptomatic postmenopausal patient -     DG Bone Density; Future  Spinal stenosis, lumbar region, with neurogenic claudication Assessment & Plan: Increase tramadol 100 mg three times daily as needed. Use vicodin sparingly as ordered per Dr. Danielle Dess.     Class 2 severe obesity with body mass index (BMI) of 35 to 39.9 with serious comorbidity [facility]) Assessment &  Plan: Recommend continue to work on eating healthy diet. Comorbidities: hyperlipidemia and hypertension.    Other orders -     traMADol HCl; Take 100 mg by mouth 3 (three) times daily as needed (back pain).  Dispense: 90 tablet; Refill: 2     Meds ordered this encounter  Medications   traMADol HCl 100 MG TABS    Sig: Take 100 mg by mouth 3 (three) times daily as needed (back pain).  Dispense:  90 tablet    Refill:  2    Orders Placed This Encounter  Procedures   MM 3D SCREENING MAMMOGRAM BILATERAL BREAST   DG Bone Density   Pneumococcal conjugate vaccine 20-valent (Prevnar 20)   CBC with Differential/Platelet   Comprehensive metabolic panel   Hemoglobin A1c   Lipid panel   TSH     Follow-up: No follow-ups on file.   I,Katherina A Bramblett,acting as a scribe for Blane Ohara, MD.,have documented all relevant documentation on the behalf of Blane Ohara, MD,as directed by  Blane Ohara, MD while in the presence of Blane Ohara, MD.   Clayborn Bigness I Leal-Borjas,acting as a scribe for Blane Ohara, MD.,have documented all relevant documentation on the behalf of Blane Ohara, MD,as directed by  Blane Ohara, MD while in the presence of Blane Ohara, MD.    An After Visit Summary was printed and given to the patient.  I attest that I have reviewed this visit and agree with the plan scribed by my staff.   Blane Ohara, MD [facility] [phone]    HPI

## 2022-09-25 NOTE — Telephone Encounter (Signed)
Patient called stating that the pharmacy called her and stated that the new rx for tramadol 100 mg daily was going to be 100$ and stated that she can't afford that and would like to go back to the Tramadol 50 mg 1 tablet twice daily to equal the amount sent in. Please advise.

## 2022-09-25 NOTE — Assessment & Plan Note (Signed)
Recommend continue to work on eating healthy diet. Comorbidities: hyperlipidemia and hypertension.

## 2022-09-26 LAB — COMPREHENSIVE METABOLIC PANEL
ALT: 16 IU/L (ref 0–32)
AST: 20 IU/L (ref 0–40)
Albumin: 3.9 g/dL (ref 3.9–4.9)
Alkaline Phosphatase: 82 IU/L (ref 44–121)
BUN/Creatinine Ratio: 22 (ref 12–28)
BUN: 17 mg/dL (ref 8–27)
CO2: 26 mmol/L (ref 20–29)
Calcium: 9.5 mg/dL (ref 8.7–10.3)
Chloride: 102 mmol/L (ref 96–106)
Creatinine, Ser: 0.78 mg/dL (ref 0.57–1.00)
Globulin, Total: 2.4 g/dL (ref 1.5–4.5)
Total Protein: 6.3 g/dL (ref 6.0–8.5)
eGFR: 84 mL/min/{1.73_m2} (ref >59)

## 2022-09-26 LAB — CBC WITH DIFFERENTIAL/PLATELET
Basos: 2 %
EOS (ABSOLUTE): 0.4 10*3/uL (ref 0.0–0.4)
Eos: 4 %
Hematocrit: 46 % (ref 34.0–46.6)
Hemoglobin: 15.1 g/dL (ref 11.1–15.9)
Immature Grans (Abs): 0.1 10*3/uL (ref 0.0–0.1)
Immature Granulocytes: 1 %
Lymphocytes Absolute: 4.4 10*3/uL — ABNORMAL HIGH (ref 0.7–3.1)
MCH: 30.2 pg (ref 26.6–33.0)
MCV: 92 fL (ref 79–97)
Monocytes Absolute: 0.9 10*3/uL (ref 0.1–0.9)
Monocytes: 8 %
Neutrophils Absolute: 5.1 10*3/uL (ref 1.4–7.0)
Platelets: 307 10*3/uL (ref 150–450)
RBC: 5 x10E6/uL (ref 3.77–5.28)
WBC: 11 10*3/uL — ABNORMAL HIGH (ref 3.4–10.8)

## 2022-09-26 LAB — LIPID PANEL
Chol/HDL Ratio: 5.6 ratio — ABNORMAL HIGH (ref 0.0–4.4)
Cholesterol, Total: 236 mg/dL — ABNORMAL HIGH (ref 100–199)
LDL Chol Calc (NIH): 142 mg/dL — ABNORMAL HIGH (ref 0–99)
Triglycerides: 285 mg/dL — ABNORMAL HIGH (ref 0–149)
VLDL Cholesterol Cal: 52 mg/dL — ABNORMAL HIGH (ref 5–40)

## 2022-09-26 LAB — HEMOGLOBIN A1C: Est. average glucose Bld gHb Est-mCnc: 146 mg/dL

## 2022-09-27 ENCOUNTER — Other Ambulatory Visit: Payer: Self-pay

## 2022-09-27 DIAGNOSIS — Z13820 Encounter for screening for osteoporosis: Secondary | ICD-10-CM

## 2022-09-27 DIAGNOSIS — Z1231 Encounter for screening mammogram for malignant neoplasm of breast: Secondary | ICD-10-CM

## 2022-09-27 NOTE — Telephone Encounter (Signed)
Patient informed. 

## 2022-09-30 ENCOUNTER — Other Ambulatory Visit: Payer: Self-pay

## 2022-09-30 MED ORDER — LEVOTHYROXINE SODIUM 125 MCG PO TABS: 125.0000 ug | ORAL_TABLET | Freq: Every day | ORAL | 0 refills | Status: DC

## 2022-10-04 DIAGNOSIS — M5416 Radiculopathy, lumbar region: Secondary | ICD-10-CM | POA: Diagnosis not present

## 2022-10-10 DIAGNOSIS — M5116 Intervertebral disc disorders with radiculopathy, lumbar region: Secondary | ICD-10-CM | POA: Diagnosis not present

## 2022-10-10 DIAGNOSIS — M5415 Radiculopathy, thoracolumbar region: Secondary | ICD-10-CM | POA: Diagnosis not present

## 2022-10-21 ENCOUNTER — Ambulatory Visit: Payer: Medicare HMO | Admitting: Family Medicine

## 2022-10-21 ENCOUNTER — Encounter: Payer: Self-pay | Admitting: Family Medicine

## 2022-10-21 VITALS — BP 110/70 | HR 79 | Temp 97.3°F | Ht 65.0 in | Wt 216.0 lb

## 2022-10-21 DIAGNOSIS — J069 Acute upper respiratory infection, unspecified: Secondary | ICD-10-CM | POA: Insufficient documentation

## 2022-10-21 DIAGNOSIS — J020 Streptococcal pharyngitis: Secondary | ICD-10-CM | POA: Diagnosis not present

## 2022-10-21 LAB — POCT INFLUENZA A/B
Influenza A, POC: NEGATIVE
Influenza B, POC: NEGATIVE

## 2022-10-21 LAB — POCT RAPID STREP A (OFFICE): Rapid Strep A Screen: POSITIVE — AB

## 2022-10-21 LAB — POC COVID19 BINAXNOW: SARS Coronavirus 2 Ag: NEGATIVE

## 2022-10-21 MED ORDER — FLUCONAZOLE 150 MG PO TABS
150.0000 mg | ORAL_TABLET | Freq: Once | ORAL | 0 refills | Status: AC
Start: 2022-10-21 — End: 2022-10-21

## 2022-10-21 MED ORDER — AMOXICILLIN 875 MG PO TABS
875.0000 mg | ORAL_TABLET | Freq: Two times a day (BID) | ORAL | 0 refills | Status: AC
Start: 2022-10-21 — End: 2022-10-31

## 2022-10-21 NOTE — Assessment & Plan Note (Addendum)
Amoxicillin 875 BID for 10 days Diflucan 150 mg once if needed for yeast infection. Discussed good hand hygiene practices, not sharing food/drinks, increase fluid intake, wear mask to avoid spreading germs to her elderly mother. Change toothbrush

## 2022-10-21 NOTE — Progress Notes (Signed)
Acute Office Visit  Subjective:    Patient ID: Caitlin Ho, female    DOB: 08/20/1955, 67 y.o.   MRN: [id number]  Chief Complaint  Patient presents with   Headache   Cough   Nasal Congestion    HPI: Patient is in today for sinus drainage which started yesterday,  dry cough, headache, no facial pain or pressure, fever/chills or body aches. Has taken tylenol sinus which has helped some.  Past Medical History:  Diagnosis Date   Cancer (HCC)    cervical cancer cells whwn patient was 22    Cervicalgia    Fibromyalgia    GERD (gastroesophageal reflux disease)    not on medication   Hashimoto's disease    Headache(784.0)    sinus   Homonymous hemianopia    Hyperlipidemia    Hypertension    Hypothyroid    Other sequelae of cerebral infarction    PONV (postoperative nausea and vomiting)    06/20/14 patient reported that BP dropped real low and she had N/V  and was slow to awaken when she had surgery at an outpatient center.   Pre-diabetes    Seasonal allergies    Sleep apnea    no CPAP   Stroke (HCC) 2019   Vitamin D deficiency     Past Surgical History:  Procedure Laterality Date   ABDOMINAL HYSTERECTOMY  1995   APPENDECTOMY  1978   BACK SURGERY  06/29/2020   L5-S1   CERVICAL FUSION  2017   2 levels   DILATION AND CURETTAGE OF UTERUS     LUMBAR DISC SURGERY  10/03/2012   Dr Danielle Dess  2 Discectomy   LUMBAR DISC SURGERY  07/2012   LUMBAR FUSION  2015   Fusion (2 levels)   LUMBAR FUSION  2016   fusion 1 level.   LUMBAR FUSION  02/16/2013   Fusion lumbar.   THYROIDECTOMY  2006   TOTAL HIP ARTHROPLASTY Right 07/03/2021   Procedure: TOTAL HIP ARTHROPLASTY ANTERIOR APPROACH;  Surgeon: Sheral Apley, MD;  Location: [facility];  Service: Orthopedics;  Laterality: Right;   TUBAL LIGATION  1990    Family History  Problem Relation Age of Onset   Aneurysm Mother        brain   Heart attack Father 10   Cancer Paternal Grandmother        Breast   Hyperlipidemia Other     Hypertension Other    Diabetes Other     Social History   Socioeconomic History   Marital status: Married    Spouse name: Peyton Najjar   Number of children: 1   Years of education: Not on file   Highest education level: Not on file  Occupational History   Occupation: Disabled  Tobacco Use   Smoking status: Never   Smokeless tobacco: Never  Vaping Use   Vaping status: Never Used  Substance and Sexual Activity   Alcohol use: No   Drug use: No   Sexual activity: Yes    Partners: Male    Birth control/protection: None  Other Topics Concern   Not on file  Social History Narrative   Not on file   Social Determinants of Health   Financial Resource Strain: Low Risk  (04/01/2022)   Overall Financial Resource Strain (CARDIA)    Difficulty of Paying Living Expenses: Not hard at all  Food Insecurity: No Food Insecurity (04/01/2022)   Hunger Vital Sign    Worried About Programme researcher, broadcasting/film/video in  the Last Year: Never true    Ran Out of Food in the Last Year: Never true  Transportation Needs: No Transportation Needs (04/01/2022)   PRAPARE - Administrator, Civil Service (Medical): No    Lack of Transportation (Non-Medical): No  Physical Activity: Inactive (04/01/2022)   Exercise Vital Sign    Days of Exercise per Week: 0 days    Minutes of Exercise per Session: 0 min  Stress: No Stress Concern Present (04/01/2022)   [employer] of Occupational Health - Occupational Stress Questionnaire    Feeling of Stress : Not at all  Social Connections: Socially Integrated (04/01/2022)   Social Connection and Isolation Panel [NHANES]    Frequency of Communication with Friends and Family: More than three times a week    Frequency of Social Gatherings with Friends and Family: Once a week    Attends Religious Services: More than 4 times per year    Active Member of [employer] or Organizations: Yes    Attends Engineer, structural: More than 4 times per year    Marital Status: Married   Catering manager Violence: Not At Risk (04/01/2022)   Humiliation, Afraid, Rape, and Kick questionnaire    Fear of Current or Ex-Partner: No    Emotionally Abused: No    Physically Abused: No    Sexually Abused: No    Outpatient Medications Prior to Visit  Medication Sig Dispense Refill   acetaminophen (TYLENOL) 500 MG tablet Take 2 tablets (1,000 mg total) by mouth every 6 (six) hours as needed for mild pain or moderate pain. 60 tablet 0   aspirin EC 81 MG tablet Take 1 tablet (81 mg total) by mouth daily. 30 tablet 11   celecoxib (CELEBREX) 200 MG capsule TAKE 1 CAPSULE EVERY DAY 90 capsule 3   cetirizine (ZYRTEC) 10 MG tablet Take 10 mg by mouth daily.     fenofibrate 160 MG tablet TAKE 1 TABLET EVERY DAY (Patient not taking: Reported on 09/23/2022) 90 tablet 1   furosemide (LASIX) 40 MG tablet Take 1 tablet (40 mg total) by mouth daily. 90 tablet 1   gabapentin (NEURONTIN) 600 MG tablet TAKE 1 TABLET THREE TIMES DAILY 270 tablet 3   HYDROcodone-acetaminophen (NORCO/VICODIN) 5-325 MG tablet Take 1 tablet by mouth 3 (three) times daily as needed for severe pain.     levothyroxine (SYNTHROID) 125 MCG tablet Take 1 tablet (125 mcg total) by mouth daily. 90 tablet 0   methocarbamol (ROBAXIN) 500 MG tablet TAKE 1 TABLET THREE TIMES DAILY 270 tablet 3   metoprolol tartrate (LOPRESSOR) 25 MG tablet TAKE 1 TABLET TWICE DAILY 180 tablet 1   omeprazole (PRILOSEC) 40 MG capsule TAKE 1 CAPSULE EVERY DAY 90 capsule 3   polyethylene glycol (MIRALAX / GLYCOLAX) 17 g packet Take 17 g by mouth daily as needed for moderate constipation.     traMADol (ULTRAM) 50 MG tablet Take 2 tablets (100 mg total) by mouth every 8 (eight) hours as needed for severe pain. 180 tablet 0   Vitamin D, Ergocalciferol, (DRISDOL) 1.25 MG (50000 UNIT) CAPS capsule TAKE 1 CAPSULE (50,000 UNITS TOTAL) BY MOUTH EVERY 7 (SEVEN) DAYS 12 capsule 1   No facility-administered medications prior to visit.    Allergies  Allergen  Reactions   Topamax [Topiramate] Other (See Comments)    Fatigue, constipation, gum tenderness, breast tenderness   Advil [Ibuprofen]     Upset stomach   Lipitor [Atorvastatin]     Myalgia  Pravachol [Pravastatin]     Joint pain    Review of Systems  Constitutional:  Negative for chills, fatigue and fever.  HENT:  Positive for congestion and sore throat. Negative for ear pain, postnasal drip, rhinorrhea, sinus pressure and sinus pain.   Respiratory:  Positive for cough. Negative for shortness of breath.   Cardiovascular:  Negative for chest pain.  Gastrointestinal:  Negative for abdominal pain, diarrhea and nausea.  Neurological:  Negative for dizziness and headaches.       Objective:        10/21/2022    9:08 AM 09/23/2022    9:30 AM 06/03/2022    9:34 AM  Vitals with BMI  Height 5\' 5"  5\' 5"  5\' 5"   Weight 216 lbs 219 lbs 212 lbs  BMI 35.94 36.44 35.28  Systolic 110 114 161  Diastolic 70 60 68  Pulse 79 70 69    No data found.   Physical Exam Vitals and nursing note reviewed.  Constitutional:      Appearance: She is obese.  HENT:     Head: Normocephalic.     Right Ear: A middle ear effusion is present.     Left Ear: Tympanic membrane normal.     Nose: Congestion present.     Right Turbinates: Pale.     Left Turbinates: Pale.     Left Sinus: Maxillary sinus tenderness and frontal sinus tenderness present.     Mouth/Throat:     Pharynx: Posterior oropharyngeal erythema present.  Cardiovascular:     Rate and Rhythm: Normal rate and regular rhythm.  Pulmonary:     Effort: Pulmonary effort is normal.     Breath sounds: Normal breath sounds.  Musculoskeletal:     Cervical back: Neck supple.  Skin:    General: Skin is warm and dry.  Neurological:     Mental Status: She is alert.  Psychiatric:        Mood and Affect: Mood normal.     Health Maintenance Due  Topic Date Due   DEXA SCAN  Never done   MAMMOGRAM  08/09/2022   INFLUENZA VACCINE  10/03/2022     There are no preventive care reminders to display for this patient.   Lab Results  Component Value Date   TSH 4.510 (H) 09/23/2022   Lab Results  Component Value Date   WBC 11.0 (H) 09/23/2022   HGB 15.1 09/23/2022   HCT 46.0 09/23/2022   MCV 92 09/23/2022   PLT 307 09/23/2022   Lab Results  Component Value Date   NA 141 09/23/2022   K 5.4 (H) 09/23/2022   CO2 26 09/23/2022   GLUCOSE 111 (H) 09/23/2022   BUN 17 09/23/2022   CREATININE 0.78 09/23/2022   BILITOT <0.2 09/23/2022   ALKPHOS 82 09/23/2022   AST 20 09/23/2022   ALT 16 09/23/2022   PROT 6.3 09/23/2022   ALBUMIN 3.9 09/23/2022   CALCIUM 9.5 09/23/2022   ANIONGAP 4 (L) 06/20/2021   EGFR 84 09/23/2022   Lab Results  Component Value Date   CHOL 236 (H) 09/23/2022   Lab Results  Component Value Date   HDL 42 09/23/2022   Lab Results  Component Value Date   LDLCALC 142 (H) 09/23/2022   Lab Results  Component Value Date   TRIG 285 (H) 09/23/2022   Lab Results  Component Value Date   CHOLHDL 5.6 (H) 09/23/2022   Lab Results  Component Value Date   HGBA1C 6.7 (H) 09/23/2022  Assessment & Plan:  Acute upper respiratory infection Assessment & Plan: Covid and flu negative Increase fluid intake, continue taking zyrtec  Orders: -     POC COVID-19 BinaxNow -     POCT Influenza A/B  Strep pharyngitis Assessment & Plan: Amoxicillin 875 BID for 10 days Diflucan 150 mg once if needed for yeast infection. Discussed good hand hygiene practices, not sharing food/drinks, increase fluid intake, wear mask to avoid spreading germs to her elderly mother. Change toothbrush      Orders: -     POCT rapid strep A -     Amoxicillin; Take 1 tablet (875 mg total) by mouth 2 (two) times daily for 10 days.  Dispense: 20 tablet; Refill: 0 -     Fluconazole; Take 1 tablet (150 mg total) by mouth once for 1 dose.  Dispense: 1 tablet; Refill: 0     Meds ordered this encounter  Medications    amoxicillin (AMOXIL) 875 MG tablet    Sig: Take 1 tablet (875 mg total) by mouth 2 (two) times daily for 10 days.    Dispense:  20 tablet    Refill:  0   fluconazole (DIFLUCAN) 150 MG tablet    Sig: Take 1 tablet (150 mg total) by mouth once for 1 dose.    Dispense:  1 tablet    Refill:  0    Orders Placed This Encounter  Procedures   POC COVID-19   Influenza A/B   POCT rapid strep A    Total time spent on today's visit was greater than 30 minutes, including both face-to-face time and nonface-to-face time personally spent on review of chart (labs and imaging), discussing labs and goals, discussing further work-up, treatment options, referrals to specialist if needed, reviewing outside records of pertinent, answering patient's questions, and coordinating care.  Follow-up: Return if symptoms worsen or fail to improve, for Keep appointment as scheduled. Patient is to keep scheduled appointments.  An After Visit Summary was printed and given to the patient.  Renne Crigler, FNP [facility] [phone]

## 2022-10-21 NOTE — Assessment & Plan Note (Addendum)
Covid and flu negative Increase fluid intake, continue taking zyrtec

## 2022-10-26 ENCOUNTER — Other Ambulatory Visit: Payer: Self-pay | Admitting: Family Medicine

## 2022-10-27 NOTE — Addendum Note (Signed)
Addended byBlane Ohara on: 10/27/2022 06:54 PM   Modules accepted: Level of Service

## 2022-11-26 DIAGNOSIS — E119 Type 2 diabetes mellitus without complications: Secondary | ICD-10-CM | POA: Diagnosis not present

## 2022-11-26 DIAGNOSIS — H2513 Age-related nuclear cataract, bilateral: Secondary | ICD-10-CM | POA: Diagnosis not present

## 2022-11-26 DIAGNOSIS — H524 Presbyopia: Secondary | ICD-10-CM | POA: Diagnosis not present

## 2022-11-26 DIAGNOSIS — H53453 Other localized visual field defect, bilateral: Secondary | ICD-10-CM | POA: Diagnosis not present

## 2022-11-26 DIAGNOSIS — H35013 Changes in retinal vascular appearance, bilateral: Secondary | ICD-10-CM | POA: Diagnosis not present

## 2022-11-26 DIAGNOSIS — H5203 Hypermetropia, bilateral: Secondary | ICD-10-CM | POA: Diagnosis not present

## 2022-11-26 DIAGNOSIS — H16143 Punctate keratitis, bilateral: Secondary | ICD-10-CM | POA: Diagnosis not present

## 2022-11-27 DIAGNOSIS — M810 Age-related osteoporosis without current pathological fracture: Secondary | ICD-10-CM | POA: Diagnosis not present

## 2022-11-27 DIAGNOSIS — N959 Unspecified menopausal and perimenopausal disorder: Secondary | ICD-10-CM | POA: Diagnosis not present

## 2022-11-27 DIAGNOSIS — Z1231 Encounter for screening mammogram for malignant neoplasm of breast: Secondary | ICD-10-CM | POA: Diagnosis not present

## 2022-11-27 LAB — HM DEXA SCAN

## 2022-11-28 ENCOUNTER — Encounter: Payer: Self-pay | Admitting: Family Medicine

## 2022-11-29 LAB — MM 3D SCREENING MAMMOGRAM BILATERAL BREAST

## 2022-12-05 ENCOUNTER — Other Ambulatory Visit: Payer: Self-pay

## 2022-12-05 DIAGNOSIS — M810 Age-related osteoporosis without current pathological fracture: Secondary | ICD-10-CM

## 2022-12-05 MED ORDER — DENOSUMAB 60 MG/ML ~~LOC~~ SOSY: 60.0000 mg | PREFILLED_SYRINGE | Freq: Once | SUBCUTANEOUS | 0 refills | Status: AC

## 2022-12-10 ENCOUNTER — Telehealth: Payer: Self-pay

## 2022-12-10 NOTE — Telephone Encounter (Signed)
The patient called this morning stating does NOT want to start the Prolia. However, the patient is ok to an oral medication.  Pharmacy: [facility].

## 2022-12-11 ENCOUNTER — Encounter: Payer: Self-pay | Admitting: Family Medicine

## 2022-12-11 ENCOUNTER — Other Ambulatory Visit: Payer: Self-pay | Admitting: Family Medicine

## 2022-12-11 MED ORDER — ALENDRONATE SODIUM 70 MG PO TABS: 70.0000 mg | ORAL_TABLET | ORAL | 3 refills | Status: DC

## 2022-12-23 ENCOUNTER — Other Ambulatory Visit: Payer: Self-pay

## 2022-12-23 DIAGNOSIS — E038 Other specified hypothyroidism: Secondary | ICD-10-CM

## 2022-12-23 DIAGNOSIS — R7303 Prediabetes: Secondary | ICD-10-CM

## 2022-12-23 DIAGNOSIS — E782 Mixed hyperlipidemia: Secondary | ICD-10-CM

## 2022-12-23 DIAGNOSIS — I10 Essential (primary) hypertension: Secondary | ICD-10-CM

## 2022-12-23 DIAGNOSIS — E559 Vitamin D deficiency, unspecified: Secondary | ICD-10-CM

## 2022-12-27 ENCOUNTER — Other Ambulatory Visit: Payer: Medicare HMO

## 2022-12-27 ENCOUNTER — Other Ambulatory Visit: Payer: Self-pay | Admitting: Family Medicine

## 2022-12-27 DIAGNOSIS — E559 Vitamin D deficiency, unspecified: Secondary | ICD-10-CM | POA: Diagnosis not present

## 2022-12-27 DIAGNOSIS — I10 Essential (primary) hypertension: Secondary | ICD-10-CM

## 2022-12-27 DIAGNOSIS — R7303 Prediabetes: Secondary | ICD-10-CM | POA: Diagnosis not present

## 2022-12-27 DIAGNOSIS — E782 Mixed hyperlipidemia: Secondary | ICD-10-CM | POA: Diagnosis not present

## 2022-12-27 DIAGNOSIS — E038 Other specified hypothyroidism: Secondary | ICD-10-CM | POA: Diagnosis not present

## 2022-12-27 LAB — COMPREHENSIVE METABOLIC PANEL
ALT: 22 [IU]/L (ref 0–32)
AST: 24 [IU]/L (ref 0–40)
Albumin: 4.1 g/dL (ref 3.9–4.9)
Alkaline Phosphatase: 72 [IU]/L (ref 44–121)
BUN/Creatinine Ratio: 22 (ref 12–28)
BUN: 21 mg/dL (ref 8–27)
Bilirubin Total: 0.2 mg/dL (ref 0.0–1.2)
CO2: 27 mmol/L (ref 20–29)
Calcium: 9.7 mg/dL (ref 8.7–10.3)
Chloride: 104 mmol/L (ref 96–106)
Creatinine, Ser: 0.94 mg/dL (ref 0.57–1.00)
Globulin, Total: 2.5 g/dL (ref 1.5–4.5)
Glucose: 116 mg/dL — ABNORMAL HIGH (ref 70–99)
Potassium: 5 mmol/L (ref 3.5–5.2)
Sodium: 143 mmol/L (ref 134–144)
Total Protein: 6.6 g/dL (ref 6.0–8.5)
eGFR: 67 mL/min/{1.73_m2} (ref >59)

## 2022-12-28 LAB — CBC WITH DIFFERENTIAL/PLATELET
Basophils Absolute: 0.2 10*3/uL (ref 0.0–0.2)
Basos: 2 %
EOS (ABSOLUTE): 0.4 10*3/uL (ref 0.0–0.4)
Eos: 4 %
Hematocrit: 43.9 % (ref 34.0–46.6)
Hemoglobin: 14.2 g/dL (ref 11.1–15.9)
Immature Grans (Abs): 0.1 10*3/uL (ref 0.0–0.1)
Immature Granulocytes: 1 %
Lymphocytes Absolute: 4.3 10*3/uL — ABNORMAL HIGH (ref 0.7–3.1)
Lymphs: 39 %
MCH: 29.5 pg (ref 26.6–33.0)
MCHC: 32.3 g/dL (ref 31.5–35.7)
MCV: 91 fL (ref 79–97)
Monocytes Absolute: 0.7 10*3/uL (ref 0.1–0.9)
Monocytes: 6 %
Neutrophils Absolute: 5.4 10*3/uL (ref 1.4–7.0)
Neutrophils: 48 %
Platelets: 366 10*3/uL (ref 150–450)
RBC: 4.82 x10E6/uL (ref 3.77–5.28)
RDW: 12.5 % (ref 11.7–15.4)
WBC: 11 10*3/uL — ABNORMAL HIGH (ref 3.4–10.8)

## 2022-12-28 LAB — LIPID PANEL
Chol/HDL Ratio: 3.7 ratio (ref 0.0–4.4)
Cholesterol, Total: 179 mg/dL (ref 100–199)
HDL: 48 mg/dL (ref >39)
LDL Chol Calc (NIH): 104 mg/dL — ABNORMAL HIGH (ref 0–99)
Triglycerides: 152 mg/dL — ABNORMAL HIGH (ref 0–149)
VLDL Cholesterol Cal: 27 mg/dL (ref 5–40)

## 2022-12-28 LAB — VITAMIN D 25 HYDROXY (VIT D DEFICIENCY, FRACTURES): Vit D, 25-Hydroxy: 44.9 ng/mL (ref 30.0–100.0)

## 2022-12-28 LAB — TSH: TSH: 5.24 u[IU]/mL — ABNORMAL HIGH (ref 0.450–4.500)

## 2022-12-28 LAB — HEMOGLOBIN A1C
Est. average glucose Bld gHb Est-mCnc: 140 mg/dL
Hgb A1c MFr Bld: 6.5 % — ABNORMAL HIGH (ref 4.8–5.6)

## 2022-12-28 LAB — T4, FREE: Free T4: 1.87 ng/dL — ABNORMAL HIGH (ref 0.82–1.77)

## 2022-12-30 NOTE — Progress Notes (Unsigned)
Subjective:  Patient ID: Caitlin Ho Comment, female    DOB: 01-03-1956  Age: 67 y.o. MRN: [id number]  No chief complaint on file.   HPI   Hypertension:  Metoprolol tartrate 25 mg twice daily and Furosemide 40 mg one daily. History of stroke - on aspirin 81 mg daily.  Hyperlipidemia:  Patient is not taking Fenofibrate 160 mg daily.  She was hoping it would help her pain, but did not so she is willing to restart it. Eating healthy.  Hypothyroidism:  Levothyroxine 112 mcg daily.   GERD:  Omeprazole 40 mg daily.   Chronic back pain. Sees Dr. Danielle Dess. On mobic 15 mg daily (but not very effective, advil causes upset stomach), robaxin 500 mg three times a day and tramadol 50 mg 2 oral twice daily. Gabapentin 600 mg three times a day.  Vitamin D deficiency: on vitamin D 50K weekly.     09/23/2022    9:33 AM 06/03/2022    9:35 AM 04/01/2022    2:05 PM 05/10/2021    2:01 PM 02/15/2020    4:21 PM  Depression screen PHQ 2/9  Decreased Interest 0 0 0 0 0  Down, Depressed, Hopeless 0 0 0 0 0  PHQ - 2 Score 0 0 0 0 0  Altered sleeping 0    0  Tired, decreased energy 1    0  Change in appetite 0    0  Feeling bad or failure about yourself  0    0  Trouble concentrating 0    0  Moving slowly or fidgety/restless 0    0  Suicidal thoughts 0    0  PHQ-9 Score 1    0  Difficult doing work/chores Not difficult at all    Not difficult at all        09/23/2022    9:32 AM  Fall Risk   Falls in the past year? 0  Number falls in past yr: 0  Injury with Fall? 0  Risk for fall due to : Impaired balance/gait;Impaired mobility  Follow up Falls evaluation completed;Falls prevention discussed    Patient Care Team: Kallen Mccrystal, Fritzi Mandes, MD as PCP - General (Family Medicine) Albin Felling, OD (Optometry) Barnett Abu, MD as Consulting Physician (Neurosurgery) Specialists, [facility] (Orthopedic Surgery)   Review of Systems  Current Outpatient Medications on File Prior to Visit  Medication Sig  Dispense Refill   acetaminophen (TYLENOL) 500 MG tablet Take 2 tablets (1,000 mg total) by mouth every 6 (six) hours as needed for mild pain or moderate pain. 60 tablet 0   alendronate (FOSAMAX) 70 MG tablet Take 1 tablet (70 mg total) by mouth every 7 (seven) days. Take with a full glass of water on an empty stomach. 12 tablet 3   aspirin EC 81 MG tablet Take 1 tablet (81 mg total) by mouth daily. 30 tablet 11   celecoxib (CELEBREX) 200 MG capsule TAKE 1 CAPSULE EVERY DAY 90 capsule 3   cetirizine (ZYRTEC) 10 MG tablet Take 10 mg by mouth daily.     fenofibrate 160 MG tablet TAKE 1 TABLET EVERY DAY (Patient not taking: Reported on 09/23/2022) 90 tablet 1   furosemide (LASIX) 40 MG tablet TAKE 1 TABLET EVERY DAY 90 tablet 3   gabapentin (NEURONTIN) 600 MG tablet TAKE 1 TABLET THREE TIMES DAILY 270 tablet 3   HYDROcodone-acetaminophen (NORCO/VICODIN) 5-325 MG tablet Take 1 tablet by mouth 3 (three) times daily as needed for severe pain.  levothyroxine (SYNTHROID) 125 MCG tablet TAKE 1 TABLET BY MOUTH EVERY DAY 90 tablet 0   methocarbamol (ROBAXIN) 500 MG tablet TAKE 1 TABLET THREE TIMES DAILY 270 tablet 3   metoprolol tartrate (LOPRESSOR) 25 MG tablet TAKE 1 TABLET TWICE DAILY 180 tablet 1   omeprazole (PRILOSEC) 40 MG capsule TAKE 1 CAPSULE EVERY DAY 90 capsule 3   polyethylene glycol (MIRALAX / GLYCOLAX) 17 g packet Take 17 g by mouth daily as needed for moderate constipation.     traMADol (ULTRAM) 50 MG tablet TAKE 2 TABLETS (100 MG TOTAL) BY MOUTH EVERY 8 (EIGHT) HOURS AS NEEDED FOR SEVERE PAIN. 180 tablet 0   Vitamin D, Ergocalciferol, (DRISDOL) 1.25 MG (50000 UNIT) CAPS capsule TAKE 1 CAPSULE (50,000 UNITS TOTAL) BY MOUTH EVERY 7 (SEVEN) DAYS 12 capsule 1   No current facility-administered medications on file prior to visit.   Past Medical History:  Diagnosis Date   Cancer (HCC)    cervical cancer cells whwn patient was 22    Cervicalgia    Fibromyalgia    GERD (gastroesophageal  reflux disease)    not on medication   Hashimoto's disease    Headache(784.0)    sinus   Homonymous hemianopia    Hyperlipidemia    Hypertension    Hypothyroid    Other sequelae of cerebral infarction    PONV (postoperative nausea and vomiting)    06/20/14 patient reported that BP dropped real low and she had N/V  and was slow to awaken when she had surgery at an outpatient center.   Pre-diabetes    Seasonal allergies    Sleep apnea    no CPAP   Stroke (HCC) 2019   Vitamin D deficiency    Past Surgical History:  Procedure Laterality Date   ABDOMINAL HYSTERECTOMY  1995   APPENDECTOMY  1978   BACK SURGERY  06/29/2020   L5-S1   CERVICAL FUSION  2017   2 levels   DILATION AND CURETTAGE OF UTERUS     LUMBAR DISC SURGERY  10/03/2012   Dr Danielle Dess  2 Discectomy   LUMBAR DISC SURGERY  07/2012   LUMBAR FUSION  2015   Fusion (2 levels)   LUMBAR FUSION  2016   fusion 1 level.   LUMBAR FUSION  02/16/2013   Fusion lumbar.   THYROIDECTOMY  2006   TOTAL HIP ARTHROPLASTY Right 07/03/2021   Procedure: TOTAL HIP ARTHROPLASTY ANTERIOR APPROACH;  Surgeon: Sheral Apley, MD;  Location: [facility];  Service: Orthopedics;  Laterality: Right;   TUBAL LIGATION  1990    Family History  Problem Relation Age of Onset   Aneurysm Mother        brain   Heart attack Father 70   Cancer Paternal Grandmother        Breast   Hyperlipidemia Other    Hypertension Other    Diabetes Other    Social History   Socioeconomic History   Marital status: Married    Spouse name: Peyton Najjar   Number of children: 1   Years of education: Not on file   Highest education level: Not on file  Occupational History   Occupation: Disabled  Tobacco Use   Smoking status: Never   Smokeless tobacco: Never  Vaping Use   Vaping status: Never Used  Substance and Sexual Activity   Alcohol use: No   Drug use: No   Sexual activity: Yes    Partners: Male    Birth control/protection: None  Other Topics  Concern   Not on  file  Social History Narrative   Not on file   Social Determinants of Health   Financial Resource Strain: Low Risk  (04/01/2022)   Overall Financial Resource Strain (CARDIA)    Difficulty of Paying Living Expenses: Not hard at all  Food Insecurity: No Food Insecurity (04/01/2022)   Hunger Vital Sign    Worried About Running Out of Food in the Last Year: Never true    Ran Out of Food in the Last Year: Never true  Transportation Needs: No Transportation Needs (04/01/2022)   PRAPARE - Administrator, Civil Service (Medical): No    Lack of Transportation (Non-Medical): No  Physical Activity: Inactive (04/01/2022)   Exercise Vital Sign    Days of Exercise per Week: 0 days    Minutes of Exercise per Session: 0 min  Stress: No Stress Concern Present (04/01/2022)   [employer] of Occupational Health - Occupational Stress Questionnaire    Feeling of Stress : Not at all  Social Connections: Socially Integrated (04/01/2022)   Social Connection and Isolation Panel [NHANES]    Frequency of Communication with Friends and Family: More than three times a week    Frequency of Social Gatherings with Friends and Family: Once a week    Attends Religious Services: More than 4 times per year    Active Member of Clubs or Organizations: Yes    Attends Banker Meetings: More than 4 times per year    Marital Status: Married    Objective:  LMP  (LMP Unknown)      10/21/2022    9:08 AM 09/23/2022    9:30 AM 06/03/2022    9:34 AM  BP/Weight  Systolic BP 110 114 124  Diastolic BP 70 60 68  Wt. (Lbs) 216 219 212  BMI 35.94 kg/m2 36.44 kg/m2 35.28 kg/m2    Physical Exam  Diabetic Foot Exam - Simple   No data filed      Lab Results  Component Value Date   WBC 11.0 (H) 12/27/2022   HGB 14.2 12/27/2022   HCT 43.9 12/27/2022   PLT 366 12/27/2022   GLUCOSE 116 (H) 12/27/2022   CHOL 179 12/27/2022   TRIG 152 (H) 12/27/2022   HDL 48 12/27/2022   LDLCALC 104 (H)  12/27/2022   ALT 22 12/27/2022   AST 24 12/27/2022   NA 143 12/27/2022   K 5.0 12/27/2022   CL 104 12/27/2022   CREATININE 0.94 12/27/2022   BUN 21 12/27/2022   CO2 27 12/27/2022   TSH 5.240 (H) 12/27/2022   HGBA1C 6.5 (H) 12/27/2022      Assessment & Plan:    Essential hypertension, benign Assessment & Plan: Well controlled.  No changes to medicines. Currently taking Metoprolol 25 mg daily. Continue to work on eating a healthy diet and exercise.     Gastroesophageal reflux disease without esophagitis Assessment & Plan: The current medical regimen is effective;  continue present plan and medications. Continue on Omeprazole 40 mg daily.     Secondary hypothyroidism Assessment & Plan: Previously well controlled Continue Synthroid at current dose     Vitamin D insufficiency Assessment & Plan: The current medical regimen is effective;  continue present plan and medications.    Prediabetes Assessment & Plan: Hemoglobin A1c 6.7%, 3 month avg of blood sugars, is in prediabetic range.  In order to prevent progression to diabetes, recommend low carb diet and regular exercise    Mixed hyperlipidemia  Assessment & Plan: Well controlled.  No changes to medicines. Restart Fenofibrate 160 mg daily. Continue to work on eating a healthy diet and exercise.        No orders of the defined types were placed in this encounter.   No orders of the defined types were placed in this encounter.    Follow-up: No follow-ups on file.   I,Marla I Leal-Borjas,acting as a scribe for Blane Ohara, MD.,have documented all relevant documentation on the behalf of Blane Ohara, MD,as directed by  Blane Ohara, MD while in the presence of Blane Ohara, MD.   An After Visit Summary was printed and given to the patient.  Blane Ohara, MD [facility] [phone]

## 2022-12-30 NOTE — Assessment & Plan Note (Signed)
Well controlled.  No changes to medicines. Currently taking Metoprolol 25 mg daily. Continue to work on eating a healthy diet and exercise.

## 2022-12-30 NOTE — Assessment & Plan Note (Addendum)
Previously well controlled Continue Synthroid at current dose  

## 2022-12-30 NOTE — Assessment & Plan Note (Signed)
Well controlled.  No changes to medicines. Restart Fenofibrate 160 mg daily. Continue to work on eating a healthy diet and exercise.

## 2022-12-30 NOTE — Assessment & Plan Note (Signed)
The current medical regimen is effective;  continue present plan and medications.

## 2022-12-30 NOTE — Assessment & Plan Note (Signed)
The current medical regimen is effective;  continue present plan and medications.  

## 2022-12-30 NOTE — Assessment & Plan Note (Signed)
Hemoglobin A1c 6.7%, 3 month avg of blood sugars, is in prediabetic range.  In order to prevent progression to diabetes, recommend low carb diet and regular exercise

## 2022-12-31 ENCOUNTER — Ambulatory Visit (INDEPENDENT_AMBULATORY_CARE_PROVIDER_SITE_OTHER): Payer: Medicare HMO | Admitting: Family Medicine

## 2022-12-31 ENCOUNTER — Encounter: Payer: Self-pay | Admitting: Family Medicine

## 2022-12-31 VITALS — BP 106/70 | HR 80 | Temp 95.0°F | Resp 16 | Ht 65.0 in | Wt 216.6 lb

## 2022-12-31 DIAGNOSIS — K219 Gastro-esophageal reflux disease without esophagitis: Secondary | ICD-10-CM

## 2022-12-31 DIAGNOSIS — I10 Essential (primary) hypertension: Secondary | ICD-10-CM

## 2022-12-31 DIAGNOSIS — E559 Vitamin D deficiency, unspecified: Secondary | ICD-10-CM

## 2022-12-31 DIAGNOSIS — E782 Mixed hyperlipidemia: Secondary | ICD-10-CM

## 2022-12-31 DIAGNOSIS — E1169 Type 2 diabetes mellitus with other specified complication: Secondary | ICD-10-CM

## 2022-12-31 DIAGNOSIS — E038 Other specified hypothyroidism: Secondary | ICD-10-CM

## 2022-12-31 DIAGNOSIS — R7303 Prediabetes: Secondary | ICD-10-CM

## 2023-01-01 ENCOUNTER — Encounter: Payer: Self-pay | Admitting: Family Medicine

## 2023-01-15 DIAGNOSIS — M1712 Unilateral primary osteoarthritis, left knee: Secondary | ICD-10-CM | POA: Diagnosis not present

## 2023-01-15 DIAGNOSIS — M1612 Unilateral primary osteoarthritis, left hip: Secondary | ICD-10-CM | POA: Diagnosis not present

## 2023-02-02 ENCOUNTER — Other Ambulatory Visit: Payer: Self-pay | Admitting: Family Medicine

## 2023-02-05 DIAGNOSIS — M1712 Unilateral primary osteoarthritis, left knee: Secondary | ICD-10-CM | POA: Diagnosis not present

## 2023-02-15 ENCOUNTER — Other Ambulatory Visit: Payer: Self-pay | Admitting: Family Medicine

## 2023-03-06 DIAGNOSIS — M25562 Pain in left knee: Secondary | ICD-10-CM | POA: Diagnosis not present

## 2023-03-06 DIAGNOSIS — S83242A Other tear of medial meniscus, current injury, left knee, initial encounter: Secondary | ICD-10-CM | POA: Diagnosis not present

## 2023-03-06 DIAGNOSIS — M1712 Unilateral primary osteoarthritis, left knee: Secondary | ICD-10-CM | POA: Diagnosis not present

## 2023-03-09 ENCOUNTER — Other Ambulatory Visit: Payer: Self-pay | Admitting: Family Medicine

## 2023-03-14 DIAGNOSIS — M1712 Unilateral primary osteoarthritis, left knee: Secondary | ICD-10-CM | POA: Diagnosis not present

## 2023-03-22 ENCOUNTER — Other Ambulatory Visit: Payer: Self-pay | Admitting: Physician Assistant

## 2023-03-27 ENCOUNTER — Other Ambulatory Visit: Payer: Self-pay | Admitting: Family Medicine

## 2023-04-02 ENCOUNTER — Other Ambulatory Visit: Payer: Self-pay

## 2023-04-02 DIAGNOSIS — R7303 Prediabetes: Secondary | ICD-10-CM

## 2023-04-02 DIAGNOSIS — E559 Vitamin D deficiency, unspecified: Secondary | ICD-10-CM

## 2023-04-02 DIAGNOSIS — E782 Mixed hyperlipidemia: Secondary | ICD-10-CM

## 2023-04-02 DIAGNOSIS — K219 Gastro-esophageal reflux disease without esophagitis: Secondary | ICD-10-CM

## 2023-04-02 DIAGNOSIS — E038 Other specified hypothyroidism: Secondary | ICD-10-CM

## 2023-04-02 DIAGNOSIS — M546 Pain in thoracic spine: Secondary | ICD-10-CM | POA: Diagnosis not present

## 2023-04-02 DIAGNOSIS — Z6834 Body mass index (BMI) 34.0-34.9, adult: Secondary | ICD-10-CM | POA: Diagnosis not present

## 2023-04-02 DIAGNOSIS — M25562 Pain in left knee: Secondary | ICD-10-CM | POA: Diagnosis not present

## 2023-04-02 DIAGNOSIS — I10 Essential (primary) hypertension: Secondary | ICD-10-CM

## 2023-04-04 ENCOUNTER — Other Ambulatory Visit: Payer: Medicare HMO

## 2023-04-04 DIAGNOSIS — E038 Other specified hypothyroidism: Secondary | ICD-10-CM

## 2023-04-04 DIAGNOSIS — I10 Essential (primary) hypertension: Secondary | ICD-10-CM | POA: Diagnosis not present

## 2023-04-04 DIAGNOSIS — E782 Mixed hyperlipidemia: Secondary | ICD-10-CM

## 2023-04-04 DIAGNOSIS — R7303 Prediabetes: Secondary | ICD-10-CM | POA: Diagnosis not present

## 2023-04-05 LAB — LIPID PANEL
Chol/HDL Ratio: 4.4 {ratio} (ref 0.0–4.4)
Cholesterol, Total: 193 mg/dL (ref 100–199)
HDL: 44 mg/dL (ref >39)
LDL Chol Calc (NIH): 119 mg/dL — ABNORMAL HIGH (ref 0–99)
Triglycerides: 169 mg/dL — ABNORMAL HIGH (ref 0–149)
VLDL Cholesterol Cal: 30 mg/dL (ref 5–40)

## 2023-04-05 LAB — CBC WITH DIFFERENTIAL/PLATELET
Basophils Absolute: 0.1 10*3/uL (ref 0.0–0.2)
Basos: 1 %
EOS (ABSOLUTE): 0.4 10*3/uL (ref 0.0–0.4)
Eos: 4 %
Hematocrit: 46.1 % (ref 34.0–46.6)
Hemoglobin: 15.1 g/dL (ref 11.1–15.9)
Immature Grans (Abs): 0.1 10*3/uL (ref 0.0–0.1)
Immature Granulocytes: 1 %
Lymphocytes Absolute: 3.9 10*3/uL — ABNORMAL HIGH (ref 0.7–3.1)
Lymphs: 41 %
MCH: 30.2 pg (ref 26.6–33.0)
MCHC: 32.8 g/dL (ref 31.5–35.7)
MCV: 92 fL (ref 79–97)
Monocytes Absolute: 0.7 10*3/uL (ref 0.1–0.9)
Monocytes: 7 %
Neutrophils Absolute: 4.5 10*3/uL (ref 1.4–7.0)
Neutrophils: 46 %
Platelets: 342 10*3/uL (ref 150–450)
RBC: 5 x10E6/uL (ref 3.77–5.28)
RDW: 12.8 % (ref 11.7–15.4)
WBC: 9.7 10*3/uL (ref 3.4–10.8)

## 2023-04-05 LAB — COMPREHENSIVE METABOLIC PANEL
ALT: 18 [IU]/L (ref 0–32)
AST: 22 [IU]/L (ref 0–40)
Albumin: 4.1 g/dL (ref 3.9–4.9)
Alkaline Phosphatase: 66 [IU]/L (ref 44–121)
BUN/Creatinine Ratio: 22 (ref 12–28)
BUN: 20 mg/dL (ref 8–27)
Bilirubin Total: 0.2 mg/dL (ref 0.0–1.2)
CO2: 24 mmol/L (ref 20–29)
Calcium: 9.8 mg/dL (ref 8.7–10.3)
Chloride: 104 mmol/L (ref 96–106)
Creatinine, Ser: 0.92 mg/dL (ref 0.57–1.00)
Globulin, Total: 2.4 g/dL (ref 1.5–4.5)
Glucose: 120 mg/dL — ABNORMAL HIGH (ref 70–99)
Potassium: 5 mmol/L (ref 3.5–5.2)
Sodium: 144 mmol/L (ref 134–144)
Total Protein: 6.5 g/dL (ref 6.0–8.5)
eGFR: 69 mL/min/{1.73_m2} (ref >59)

## 2023-04-05 LAB — HEMOGLOBIN A1C
Est. average glucose Bld gHb Est-mCnc: 137 mg/dL
Hgb A1c MFr Bld: 6.4 % — ABNORMAL HIGH (ref 4.8–5.6)

## 2023-04-05 LAB — T4, FREE: Free T4: 1.81 ng/dL — ABNORMAL HIGH (ref 0.82–1.77)

## 2023-04-05 LAB — TSH: TSH: 6.08 u[IU]/mL — ABNORMAL HIGH (ref 0.450–4.500)

## 2023-04-07 ENCOUNTER — Encounter: Payer: Self-pay | Admitting: Family Medicine

## 2023-04-08 ENCOUNTER — Encounter: Payer: Self-pay | Admitting: Family Medicine

## 2023-04-08 ENCOUNTER — Ambulatory Visit: Payer: Medicare HMO | Admitting: Family Medicine

## 2023-04-08 VITALS — BP 104/80 | HR 81 | Temp 97.1°F | Resp 14 | Ht 65.0 in | Wt 212.0 lb

## 2023-04-08 DIAGNOSIS — E782 Mixed hyperlipidemia: Secondary | ICD-10-CM

## 2023-04-08 DIAGNOSIS — I10 Essential (primary) hypertension: Secondary | ICD-10-CM

## 2023-04-08 DIAGNOSIS — E559 Vitamin D deficiency, unspecified: Secondary | ICD-10-CM

## 2023-04-08 DIAGNOSIS — E1169 Type 2 diabetes mellitus with other specified complication: Secondary | ICD-10-CM

## 2023-04-08 DIAGNOSIS — E038 Other specified hypothyroidism: Secondary | ICD-10-CM

## 2023-04-08 DIAGNOSIS — K219 Gastro-esophageal reflux disease without esophagitis: Secondary | ICD-10-CM

## 2023-04-08 MED ORDER — VITAMIN D (ERGOCALCIFEROL) 1.25 MG (50000 UNIT) PO CAPS: 50000.0000 [IU] | ORAL_CAPSULE | ORAL | 1 refills | Status: DC

## 2023-04-08 MED ORDER — LEVOTHYROXINE SODIUM 137 MCG PO TABS: 137.0000 ug | ORAL_TABLET | Freq: Every day | ORAL | 1 refills | Status: DC

## 2023-04-08 NOTE — Assessment & Plan Note (Signed)
Well controlled.  No changes to medicines. Currently taking Metoprolol 25 mg daily. Continue to work on eating a healthy diet and exercise.

## 2023-04-08 NOTE — Assessment & Plan Note (Signed)
The current medical regimen is effective;  continue present plan and medications.

## 2023-04-08 NOTE — Progress Notes (Signed)
 Subjective:  Patient ID: Caitlin Ho Slight, female    DOB: [birth year]  Age: 68 y.o. MRN: [id number]  Chief Complaint  Patient presents with   Medical Management of Chronic Issues    HPI Osteoporosis:  Alendronate  70 mg weekly.   Hypertension:  Metoprolol  tartrate 25 mg twice daily and Furosemide  40 mg one daily. History of stroke - on aspirin  81 mg daily.    Hyperlipidemia:  Patient is  taking Fenofibrate  160 mg daily.  Eating healthy.    Hypothyroidism:  Levothyroxine  125 mcg daily.  Tsh is not therapeutic.    GERD:  Omeprazole  40 mg daily.     Chronic back pain. Sees Dr. Colon. On celebrex  200 mg daily, robaxin  500 mg three times a day and tramadol  50 mg 2 oral twice daily. Gabapentin  600 mg three times a day.    Vitamin D  deficiency: on vitamin D  50 K weekly.  DMII: A1C 6.4 on 04/04/23.     12/31/2022   11:39 AM 09/23/2022    9:33 AM 06/03/2022    9:35 AM 04/01/2022    2:05 PM 05/10/2021    2:01 PM  Depression screen PHQ 2/9  Decreased Interest 0 0 0 0 0  Down, Depressed, Hopeless 0 0 0 0 0  PHQ - 2 Score 0 0 0 0 0  Altered sleeping 0 0     Tired, decreased energy 1 1     Change in appetite 1 0     Feeling bad or failure about yourself  0 0     Trouble concentrating 1 0     Moving slowly or fidgety/restless 0 0     Suicidal thoughts 0 0     PHQ-9 Score 3 1     Difficult doing work/chores Not difficult at all Not difficult at all           12/31/2022   11:38 AM  Fall Risk   Falls in the past year? 0  Number falls in past yr: 0  Injury with Fall? 0  Risk for fall due to : Impaired mobility  Follow up Falls evaluation completed;Falls prevention discussed    Patient Care Team: Sherre Clapper, MD as PCP - General (Family Medicine) Erasmo Bernardino BRAVO, OD (Optometry) Colon Shove, MD as Consulting Physician (Neurosurgery) Specialists, [facility] (Orthopedic Surgery)   Review of Systems  Constitutional:  Negative for chills, fatigue and fever.  HENT:   Negative for congestion, ear pain, rhinorrhea and sore throat.   Respiratory:  Negative for cough and shortness of breath.   Cardiovascular:  Negative for chest pain.  Gastrointestinal:  Negative for abdominal pain, constipation, diarrhea, nausea and vomiting.  Genitourinary:  Negative for dysuria and urgency.  Musculoskeletal:  Positive for arthralgias and back pain. Negative for myalgias.  Neurological:  Negative for dizziness, weakness, light-headedness and headaches.  Psychiatric/Behavioral:  Negative for dysphoric mood. The patient is not nervous/anxious.     Current Outpatient Medications on File Prior to Visit  Medication Sig Dispense Refill   acetaminophen  (TYLENOL ) 500 MG tablet Take 2 tablets (1,000 mg total) by mouth every 6 (six) hours as needed for mild pain or moderate pain. 60 tablet 0   alendronate  (FOSAMAX ) 70 MG tablet Take 1 tablet (70 mg total) by mouth every 7 (seven) days. Take with a full glass of water  on an empty stomach. 12 tablet 3   aspirin  EC 81 MG tablet Take 1 tablet (81 mg total) by mouth daily. 30 tablet 11  celecoxib  (CELEBREX ) 200 MG capsule TAKE 1 CAPSULE EVERY DAY 90 capsule 3   cetirizine (ZYRTEC) 10 MG tablet Take 10 mg by mouth daily.     fenofibrate  160 MG tablet TAKE 1 TABLET EVERY DAY 90 tablet 1   furosemide  (LASIX ) 40 MG tablet TAKE 1 TABLET EVERY DAY 90 tablet 3   gabapentin  (NEURONTIN ) 600 MG tablet TAKE 1 TABLET THREE TIMES DAILY 270 tablet 3   HYDROcodone -acetaminophen  (NORCO/VICODIN) 5-325 MG tablet Take 1 tablet by mouth 3 (three) times daily as needed for severe pain.     methocarbamol  (ROBAXIN ) 500 MG tablet TAKE 1 TABLET THREE TIMES DAILY 270 tablet 1   metoprolol  tartrate (LOPRESSOR ) 25 MG tablet TAKE 1 TABLET TWICE DAILY 180 tablet 1   omeprazole  (PRILOSEC) 40 MG capsule TAKE 1 CAPSULE EVERY DAY 90 capsule 1   polyethylene glycol (MIRALAX  / GLYCOLAX ) 17 g packet Take 17 g by mouth daily as needed for moderate constipation.     traMADol   (ULTRAM ) 50 MG tablet TAKE 2 TABLETS (100 MG TOTAL) BY MOUTH EVERY 8 (EIGHT) HOURS AS NEEDED FOR SEVERE PAIN. 180 tablet 0   No current facility-administered medications on file prior to visit.   Past Medical History:  Diagnosis Date   Cancer (HCC)    cervical cancer cells whwn patient was 22    Cervicalgia    Fibromyalgia    GERD (gastroesophageal reflux disease)    not on medication   Hashimoto's disease    Headache(784.0)    sinus   Homonymous hemianopia    Hyperlipidemia    Hypertension    Hypothyroid    Other sequelae of cerebral infarction    PONV (postoperative nausea and vomiting)    06/20/14 patient reported that BP dropped real low and she had N/V  and was slow to awaken when she had surgery at an outpatient center.   Pre-diabetes    Seasonal allergies    Sleep apnea    no CPAP   Stroke (HCC) 2019   Vitamin D  deficiency    Past Surgical History:  Procedure Laterality Date   ABDOMINAL HYSTERECTOMY  1995   APPENDECTOMY  1978   BACK SURGERY  06/29/2020   L5-S1   CERVICAL FUSION  2017   2 levels   DILATION AND CURETTAGE OF UTERUS     LUMBAR DISC SURGERY  10/03/2012   Dr Colon  2 Discectomy   LUMBAR DISC SURGERY  07/2012   LUMBAR FUSION  2015   Fusion (2 levels)   LUMBAR FUSION  2016   fusion 1 level.   LUMBAR FUSION  02/16/2013   Fusion lumbar.   THYROIDECTOMY  2006   TOTAL HIP ARTHROPLASTY Right 07/03/2021   Procedure: TOTAL HIP ARTHROPLASTY ANTERIOR APPROACH;  Surgeon: Beverley Evalene BIRCH, MD;  Location: [facility];  Service: Orthopedics;  Laterality: Right;   TUBAL LIGATION  1990    Family History  Problem Relation Age of Onset   Aneurysm Mother        brain   Heart attack Father 64   Cancer Paternal Grandmother        Breast   Hyperlipidemia Other    Hypertension Other    Diabetes Other    Social History   Socioeconomic History   Marital status: Married    Spouse name: Ubaldo   Number of children: 1   Years of education: Not on file   Highest  education level: Not on file  Occupational History   Occupation: Disabled  Tobacco Use   Smoking status: Never   Smokeless tobacco: Never  Vaping Use   Vaping status: Never Used  Substance and Sexual Activity   Alcohol use: No   Drug use: No   Sexual activity: Yes    Partners: Male    Birth control/protection: None  Other Topics Concern   Not on file  Social History Narrative   Not on file   Social Drivers of Health   Financial Resource Strain: Low Risk  (04/01/2022)   Overall Financial Resource Strain (CARDIA)    Difficulty of Paying Living Expenses: Not hard at all  Food Insecurity: No Food Insecurity (04/01/2022)   Hunger Vital Sign    Worried About Running Out of Food in the Last Year: Never true    Ran Out of Food in the Last Year: Never true  Transportation Needs: No Transportation Needs (04/01/2022)   PRAPARE - Administrator, Civil Service (Medical): No    Lack of Transportation (Non-Medical): No  Physical Activity: Inactive (04/01/2022)   Exercise Vital Sign    Days of Exercise per Week: 0 days    Minutes of Exercise per Session: 0 min  Stress: No Stress Concern Present (04/01/2022)   [employer] of Occupational Health - Occupational Stress Questionnaire    Feeling of Stress : Not at all  Social Connections: Socially Integrated (04/01/2022)   Social Connection and Isolation Panel [NHANES]    Frequency of Communication with Friends and Family: More than three times a week    Frequency of Social Gatherings with Friends and Family: Once a week    Attends Religious Services: More than 4 times per year    Active Member of [employer] or Organizations: Yes    Attends Engineer, Structural: More than 4 times per year    Marital Status: Married    Objective:  BP 104/80   Pulse 81   Temp (!) 97.1 F (36.2 C)   Resp 14   Ht 5' 5 (1.651 m)   Wt 212 lb (96.2 kg)   LMP  (LMP Unknown)   SpO2 94%   BMI 35.28 kg/m      04/08/2023    2:13 PM  12/31/2022   11:34 AM 10/21/2022    9:08 AM  BP/Weight  Systolic BP 104 106 110  Diastolic BP 80 70 70  Wt. (Lbs) 212 216.6 216  BMI 35.28 kg/m2 36.04 kg/m2 35.94 kg/m2    Physical Exam Vitals reviewed.  Constitutional:      Appearance: Normal appearance. She is normal weight.  Neck:     Vascular: No carotid bruit.  Cardiovascular:     Rate and Rhythm: Normal rate and regular rhythm.     Heart sounds: Normal heart sounds.  Pulmonary:     Effort: Pulmonary effort is normal. No respiratory distress.     Breath sounds: Normal breath sounds.  Abdominal:     General: Abdomen is flat. Bowel sounds are normal.     Palpations: Abdomen is soft.     Tenderness: There is no abdominal tenderness.  Neurological:     Mental Status: She is alert and oriented to person, place, and time.  Psychiatric:        Mood and Affect: Mood normal.        Behavior: Behavior normal.     Diabetic Foot Exam - Simple   Simple Foot Form Diabetic Foot exam was performed with the following findings: Yes 04/08/2023  2:48 PM  Visual  Inspection No deformities, no ulcerations, no other skin breakdown bilaterally: Yes Sensation Testing Intact to touch and monofilament testing bilaterally: Yes Pulse Check Posterior Tibialis and Dorsalis pulse intact bilaterally: Yes Comments      Lab Results  Component Value Date   WBC 9.7 04/04/2023   HGB 15.1 04/04/2023   HCT 46.1 04/04/2023   PLT 342 04/04/2023   GLUCOSE 120 (H) 04/04/2023   CHOL 193 04/04/2023   TRIG 169 (H) 04/04/2023   HDL 44 04/04/2023   LDLCALC 119 (H) 04/04/2023   ALT 18 04/04/2023   AST 22 04/04/2023   NA 144 04/04/2023   K 5.0 04/04/2023   CL 104 04/04/2023   CREATININE 0.92 04/04/2023   BUN 20 04/04/2023   CO2 24 04/04/2023   TSH 6.080 (H) 04/04/2023   HGBA1C 6.4 (H) 04/04/2023      Assessment & Plan:    Essential hypertension, benign Assessment & Plan: Well controlled.  No changes to medicines. Currently taking  Metoprolol  25 mg daily. Continue to work on eating a healthy diet and exercise.     Secondary hypothyroidism Assessment & Plan: Previously well controlled Continue Synthroid  at current dose    Orders: -     Levothyroxine  Sodium; Take 1 tablet (137 mcg total) by mouth daily before breakfast.  Dispense: 90 tablet; Refill: 1  Gastroesophageal reflux disease without esophagitis Assessment & Plan: The current medical regimen is effective;  continue present plan and medications. Continue on Omeprazole  40 mg daily.     Vitamin D  insufficiency Assessment & Plan: The current medical regimen is effective;  continue present plan and medications.   Orders: -     Vitamin D  (Ergocalciferol ); Take 1 capsule (50,000 Units total) by mouth every 7 (seven) days.  Dispense: 12 capsule; Refill: 1  Mixed hyperlipidemia Assessment & Plan: Well controlled.  No changes to medicines. Restart Fenofibrate  160 mg daily. Continue to work on eating a healthy diet and exercise.     Combined hyperlipidemia associated with type 2 diabetes mellitus (HCC) Assessment & Plan: Control: Controlled Recommend check sugars fasting daily. Recommend check feet daily. Recommend annual eye exams. Medicines: none Continue to work on eating a healthy diet and exercise.    Orders: -     Microalbumin / creatinine urine ratio    Reviewed labs done prior to her appointment.   Meds ordered this encounter  Medications   levothyroxine  (SYNTHROID ) 137 MCG tablet    Sig: Take 1 tablet (137 mcg total) by mouth daily before breakfast.    Dispense:  90 tablet    Refill:  1   Vitamin D , Ergocalciferol , (DRISDOL ) 1.25 MG (50000 UNIT) CAPS capsule    Sig: Take 1 capsule (50,000 Units total) by mouth every 7 (seven) days.    Dispense:  12 capsule    Refill:  1    Orders Placed This Encounter  Procedures   Urine Albumin /Creatinine with ratio (send out) [LAB689]     Follow-up: Return in about 3 months (around  07/06/2023) for chronic follow up, labs ahead of time .   I,Katherina A Bramblett,acting as a scribe for Abigail Free, MD.,have documented all relevant documentation on the behalf of Abigail Free, MD,as directed by  Abigail Free, MD while in the presence of Abigail Free, MD.   LILLETTE Kato I Leal-Borjas,acting as a scribe for Abigail Free, MD.,have documented all relevant documentation on the behalf of Abigail Free, MD,as directed by  Abigail Free, MD while in the presence of Abigail Free, MD.  An After Visit Summary was printed and given to the patient.  Abigail Free, MD [facility] [phone]

## 2023-04-08 NOTE — Assessment & Plan Note (Signed)
Well controlled.  No changes to medicines. Restart Fenofibrate 160 mg daily. Continue to work on eating a healthy diet and exercise.

## 2023-04-08 NOTE — Assessment & Plan Note (Signed)
 Previously well controlled Continue Synthroid at current dose

## 2023-04-11 NOTE — Assessment & Plan Note (Addendum)
 Control: Controlled Recommend check sugars fasting daily. Recommend check feet daily. Recommend annual eye exams. Medicines: none Continue to work on eating a healthy diet and exercise.

## 2023-04-11 NOTE — Assessment & Plan Note (Signed)
 The current medical regimen is effective;  continue present plan and medications.

## 2023-05-08 DIAGNOSIS — M5415 Radiculopathy, thoracolumbar region: Secondary | ICD-10-CM | POA: Diagnosis not present

## 2023-05-08 DIAGNOSIS — M5115 Intervertebral disc disorders with radiculopathy, thoracolumbar region: Secondary | ICD-10-CM | POA: Diagnosis not present

## 2023-05-19 ENCOUNTER — Other Ambulatory Visit: Payer: Self-pay | Admitting: Family Medicine

## 2023-05-21 DIAGNOSIS — M25562 Pain in left knee: Secondary | ICD-10-CM | POA: Diagnosis not present

## 2023-05-21 DIAGNOSIS — M25552 Pain in left hip: Secondary | ICD-10-CM | POA: Diagnosis not present

## 2023-05-26 DIAGNOSIS — M1612 Unilateral primary osteoarthritis, left hip: Secondary | ICD-10-CM | POA: Diagnosis not present

## 2023-05-27 DIAGNOSIS — M25552 Pain in left hip: Secondary | ICD-10-CM | POA: Diagnosis not present

## 2023-06-18 DIAGNOSIS — S83242A Other tear of medial meniscus, current injury, left knee, initial encounter: Secondary | ICD-10-CM | POA: Diagnosis not present

## 2023-06-26 DIAGNOSIS — M2242 Chondromalacia patellae, left knee: Secondary | ICD-10-CM | POA: Diagnosis not present

## 2023-06-26 DIAGNOSIS — M1712 Unilateral primary osteoarthritis, left knee: Secondary | ICD-10-CM | POA: Diagnosis not present

## 2023-06-26 DIAGNOSIS — G8918 Other acute postprocedural pain: Secondary | ICD-10-CM | POA: Diagnosis not present

## 2023-06-26 DIAGNOSIS — S83242A Other tear of medial meniscus, current injury, left knee, initial encounter: Secondary | ICD-10-CM | POA: Diagnosis not present

## 2023-06-30 ENCOUNTER — Other Ambulatory Visit: Payer: Self-pay | Admitting: Family Medicine

## 2023-07-03 ENCOUNTER — Ambulatory Visit: Payer: Medicare HMO

## 2023-07-03 ENCOUNTER — Other Ambulatory Visit

## 2023-07-07 ENCOUNTER — Ambulatory Visit: Payer: Medicare HMO | Admitting: Family Medicine

## 2023-07-07 DIAGNOSIS — M1712 Unilateral primary osteoarthritis, left knee: Secondary | ICD-10-CM | POA: Diagnosis not present

## 2023-08-04 ENCOUNTER — Other Ambulatory Visit: Payer: Self-pay

## 2023-08-04 ENCOUNTER — Other Ambulatory Visit: Payer: Self-pay | Admitting: Physician Assistant

## 2023-08-04 DIAGNOSIS — I10 Essential (primary) hypertension: Secondary | ICD-10-CM

## 2023-08-04 DIAGNOSIS — E1169 Type 2 diabetes mellitus with other specified complication: Secondary | ICD-10-CM

## 2023-08-04 DIAGNOSIS — E782 Mixed hyperlipidemia: Secondary | ICD-10-CM

## 2023-08-04 DIAGNOSIS — E038 Other specified hypothyroidism: Secondary | ICD-10-CM

## 2023-08-04 DIAGNOSIS — E559 Vitamin D deficiency, unspecified: Secondary | ICD-10-CM

## 2023-08-05 ENCOUNTER — Other Ambulatory Visit

## 2023-08-05 DIAGNOSIS — E1169 Type 2 diabetes mellitus with other specified complication: Secondary | ICD-10-CM | POA: Diagnosis not present

## 2023-08-05 DIAGNOSIS — E782 Mixed hyperlipidemia: Secondary | ICD-10-CM | POA: Diagnosis not present

## 2023-08-05 DIAGNOSIS — I10 Essential (primary) hypertension: Secondary | ICD-10-CM

## 2023-08-05 DIAGNOSIS — E038 Other specified hypothyroidism: Secondary | ICD-10-CM | POA: Diagnosis not present

## 2023-08-05 DIAGNOSIS — E559 Vitamin D deficiency, unspecified: Secondary | ICD-10-CM

## 2023-08-06 ENCOUNTER — Ambulatory Visit: Payer: Self-pay | Admitting: Family Medicine

## 2023-08-06 LAB — CBC WITH DIFFERENTIAL/PLATELET
Basophils Absolute: 0.2 10*3/uL (ref 0.0–0.2)
Basos: 2 %
EOS (ABSOLUTE): 0.4 10*3/uL (ref 0.0–0.4)
Eos: 4 %
Hematocrit: 46.1 % (ref 34.0–46.6)
Hemoglobin: 14.8 g/dL (ref 11.1–15.9)
Immature Grans (Abs): 0.1 10*3/uL (ref 0.0–0.1)
Immature Granulocytes: 1 %
Lymphocytes Absolute: 4 10*3/uL — ABNORMAL HIGH (ref 0.7–3.1)
Lymphs: 40 %
MCH: 29.6 pg (ref 26.6–33.0)
MCHC: 32.1 g/dL (ref 31.5–35.7)
MCV: 92 fL (ref 79–97)
Monocytes Absolute: 0.8 10*3/uL (ref 0.1–0.9)
Monocytes: 8 %
Neutrophils Absolute: 4.7 10*3/uL (ref 1.4–7.0)
Neutrophils: 45 %
Platelets: 327 10*3/uL (ref 150–450)
RBC: 5 x10E6/uL (ref 3.77–5.28)
RDW: 12.3 % (ref 11.7–15.4)
WBC: 10.2 10*3/uL (ref 3.4–10.8)

## 2023-08-06 LAB — LIPID PANEL
Chol/HDL Ratio: 5.1 ratio — ABNORMAL HIGH (ref 0.0–4.4)
Cholesterol, Total: 203 mg/dL — ABNORMAL HIGH (ref 100–199)
HDL: 40 mg/dL (ref >39)
LDL Chol Calc (NIH): 122 mg/dL — ABNORMAL HIGH (ref 0–99)
Triglycerides: 231 mg/dL — ABNORMAL HIGH (ref 0–149)
VLDL Cholesterol Cal: 41 mg/dL — ABNORMAL HIGH (ref 5–40)

## 2023-08-06 LAB — COMPREHENSIVE METABOLIC PANEL WITH GFR
ALT: 21 IU/L (ref 0–32)
AST: 22 IU/L (ref 0–40)
Albumin: 4.2 g/dL (ref 3.9–4.9)
Alkaline Phosphatase: 65 IU/L (ref 44–121)
BUN/Creatinine Ratio: 22 (ref 12–28)
BUN: 20 mg/dL (ref 8–27)
Bilirubin Total: 0.3 mg/dL (ref 0.0–1.2)
CO2: 24 mmol/L (ref 20–29)
Calcium: 9.8 mg/dL (ref 8.7–10.3)
Chloride: 103 mmol/L (ref 96–106)
Creatinine, Ser: 0.92 mg/dL (ref 0.57–1.00)
Globulin, Total: 2.2 g/dL (ref 1.5–4.5)
Glucose: 137 mg/dL — ABNORMAL HIGH (ref 70–99)
Potassium: 5.3 mmol/L — ABNORMAL HIGH (ref 3.5–5.2)
Sodium: 143 mmol/L (ref 134–144)
Total Protein: 6.4 g/dL (ref 6.0–8.5)
eGFR: 69 mL/min/{1.73_m2} (ref >59)

## 2023-08-06 LAB — TSH: TSH: 2.28 u[IU]/mL (ref 0.450–4.500)

## 2023-08-06 LAB — VITAMIN D 25 HYDROXY (VIT D DEFICIENCY, FRACTURES): Vit D, 25-Hydroxy: 23.2 ng/mL — ABNORMAL LOW (ref 30.0–100.0)

## 2023-08-06 LAB — HEMOGLOBIN A1C
Est. average glucose Bld gHb Est-mCnc: 134 mg/dL
Hgb A1c MFr Bld: 6.3 % — ABNORMAL HIGH (ref 4.8–5.6)

## 2023-08-11 NOTE — Progress Notes (Signed)
 Subjective:  Patient ID: Caitlin Ho, female    DOB: Apr 25, 1955  Age: 68 y.o. MRN: [id number]  Chief Complaint  Patient presents with   Medical Management of Chronic Issues    HPI: Osteoporosis:  Alendronate  70 mg weekly.   Hypertension:  Metoprolol  tartrate 25 mg twice daily and Furosemide  40 mg one daily. History of stroke - on aspirin  81 mg daily.    Hyperlipidemia:  Patient is  taking Fenofibrate  160 mg daily.  Not eating healthy.    Hypothyroidism:  Levothyroxine  125 mcg daily.  Tsh is not therapeutic.    GERD:  Omeprazole  40 mg daily.     Chronic back pain. Sees Dr. Ellery Guthrie. On celebrex  200 mg daily, robaxin  500 mg three times a day and tramadol  50 mg 2 oral three daily. Gabapentin  600 mg three times a day.    Vitamin D  deficiency: on vitamin D  50 K weekly.  DMII: A1C 6.4. not checking sugars, checking feet daily. Saw eye doctor 11/2022.  Left knee surgery early in April 2025. Meniscal and acl repair. Improving.     08/12/2023    2:43 PM 12/31/2022   11:39 AM 09/23/2022    9:33 AM 06/03/2022    9:35 AM 04/01/2022    2:05 PM  Depression screen PHQ 2/9  Decreased Interest 0 0 0 0 0  Down, Depressed, Hopeless 0 0 0 0 0  PHQ - 2 Score 0 0 0 0 0  Altered sleeping  0 0    Tired, decreased energy  1 1    Change in appetite  1 0    Feeling bad or failure about yourself   0 0    Trouble concentrating  1 0    Moving slowly or fidgety/restless  0 0    Suicidal thoughts  0 0    PHQ-9 Score  3 1    Difficult doing work/chores  Not difficult at all Not difficult at all          08/12/2023    2:45 PM  Fall Risk   Falls in the past year? 0  Number falls in past yr: 0  Injury with Fall? 0  Risk for fall due to : No Fall Risks  Follow up Falls evaluation completed    Patient Care Team: Mercy Stall, MD as PCP - General (Family Medicine) Murtis Arthur, OD (Optometry) Elna Haggis, MD as Consulting Physician (Neurosurgery) Specialists, [facility]  (Orthopedic Surgery)   Review of Systems  Constitutional:  Negative for chills, fatigue and fever.  HENT:  Negative for congestion, ear pain, rhinorrhea and sore throat.   Respiratory:  Negative for cough and shortness of breath.   Cardiovascular:  Negative for chest pain.  Gastrointestinal:  Negative for abdominal pain, constipation, diarrhea, nausea and vomiting.  Genitourinary:  Negative for dysuria and urgency.  Musculoskeletal:  Negative for back pain and myalgias.  Neurological:  Negative for dizziness, weakness, light-headedness and headaches.  Psychiatric/Behavioral:  Negative for dysphoric mood. The patient is not nervous/anxious.     Current Outpatient Medications on File Prior to Visit  Medication Sig Dispense Refill   acetaminophen  (TYLENOL ) 500 MG tablet Take 2 tablets (1,000 mg total) by mouth every 6 (six) hours as needed for mild pain or moderate pain. 60 tablet 0   alendronate  (FOSAMAX ) 70 MG tablet Take 1 tablet (70 mg total) by mouth every 7 (seven) days. Take with a full glass of water  on an empty stomach. 12 tablet 3  aspirin  EC 81 MG tablet Take 1 tablet (81 mg total) by mouth daily. 30 tablet 11   celecoxib  (CELEBREX ) 200 MG capsule TAKE 1 CAPSULE EVERY DAY 90 capsule 3   cetirizine (ZYRTEC) 10 MG tablet Take 10 mg by mouth daily.     fenofibrate  160 MG tablet TAKE 1 TABLET EVERY DAY 90 tablet 1   furosemide  (LASIX ) 40 MG tablet TAKE 1 TABLET EVERY DAY 90 tablet 3   gabapentin  (NEURONTIN ) 600 MG tablet TAKE 1 TABLET THREE TIMES DAILY 270 tablet 3   HYDROcodone -acetaminophen  (NORCO/VICODIN) 5-325 MG tablet Take 1 tablet by mouth 3 (three) times daily as needed for severe pain.     levothyroxine  (SYNTHROID ) 137 MCG tablet Take 1 tablet (137 mcg total) by mouth daily before breakfast. 90 tablet 1   methocarbamol  (ROBAXIN ) 500 MG tablet TAKE 1 TABLET THREE TIMES DAILY 270 tablet 3   metoprolol  tartrate (LOPRESSOR ) 25 MG tablet TAKE 1 TABLET TWICE DAILY 180 tablet 3    omeprazole  (PRILOSEC) 40 MG capsule TAKE 1 CAPSULE EVERY DAY 90 capsule 3   polyethylene glycol (MIRALAX  / GLYCOLAX ) 17 g packet Take 17 g by mouth daily as needed for moderate constipation.     traMADol  (ULTRAM ) 50 MG tablet Take 2 tablets (100 mg total) by mouth every 8 (eight) hours as needed for severe pain (pain score 7-10). 180 tablet 2   No current facility-administered medications on file prior to visit.   Past Medical History:  Diagnosis Date   Cancer (HCC)    cervical cancer cells whwn patient was 22    Cervicalgia    Fibromyalgia    GERD (gastroesophageal reflux disease)    not on medication   Hashimoto's disease    Headache(784.0)    sinus   Homonymous hemianopia    Hyperlipidemia    Hypertension    Hypothyroid    Other sequelae of cerebral infarction    PONV (postoperative nausea and vomiting)    06/20/14 patient reported that BP dropped real low and she had N/V  and was slow to awaken when she had surgery at an outpatient center.   Pre-diabetes    Seasonal allergies    Sleep apnea    no CPAP   Stroke (HCC) 2019   Vitamin D  deficiency    Past Surgical History:  Procedure Laterality Date   ABDOMINAL HYSTERECTOMY  1995   APPENDECTOMY  1978   BACK SURGERY  06/29/2020   L5-S1   CERVICAL FUSION  2017   2 levels   DILATION AND CURETTAGE OF UTERUS     LUMBAR DISC SURGERY  10/03/2012   Dr Ellery Guthrie  2 Discectomy   LUMBAR DISC SURGERY  07/2012   LUMBAR FUSION  2015   Fusion (2 levels)   LUMBAR FUSION  2016   fusion 1 level.   LUMBAR FUSION  02/16/2013   Fusion lumbar.   THYROIDECTOMY  2006   TOTAL HIP ARTHROPLASTY Right 07/03/2021   Procedure: TOTAL HIP ARTHROPLASTY ANTERIOR APPROACH;  Surgeon: Saundra Curl, MD;  Location: [facility];  Service: Orthopedics;  Laterality: Right;   TUBAL LIGATION  1990    Family History  Problem Relation Age of Onset   Aneurysm Mother        brain   Heart attack Father 110   Cancer Paternal Grandmother        Breast    Hyperlipidemia Other    Hypertension Other    Diabetes Other    Social History  Socioeconomic History   Marital status: Married    Spouse name: Hewitt Lou   Number of children: 1   Years of education: Not on file   Highest education level: Not on file  Occupational History   Occupation: Disabled  Tobacco Use   Smoking status: Never   Smokeless tobacco: Never  Vaping Use   Vaping status: Never Used  Substance and Sexual Activity   Alcohol use: No   Drug use: No   Sexual activity: Yes    Partners: Male    Birth control/protection: None  Other Topics Concern   Not on file  Social History Narrative   Not on file   Social Drivers of Health   Financial Resource Strain: Low Risk  (08/12/2023)   Overall Financial Resource Strain (CARDIA)    Difficulty of Paying Living Expenses: Not hard at all  Food Insecurity: No Food Insecurity (08/12/2023)   Hunger Vital Sign    Worried About Running Out of Food in the Last Year: Never true    Ran Out of Food in the Last Year: Never true  Transportation Needs: No Transportation Needs (08/12/2023)   PRAPARE - Administrator, Civil Service (Medical): No    Lack of Transportation (Non-Medical): No  Physical Activity: Inactive (08/12/2023)   Exercise Vital Sign    Days of Exercise per Week: 0 days    Minutes of Exercise per Session: 0 min  Stress: No Stress Concern Present (08/12/2023)   [employer] of Occupational Health - Occupational Stress Questionnaire    Feeling of Stress : Not at all  Social Connections: Socially Integrated (08/12/2023)   Social Connection and Isolation Panel    Frequency of Communication with Friends and Family: More than three times a week    Frequency of Social Gatherings with Friends and Family: Once a week    Attends Religious Services: More than 4 times per year    Active Member of [employer] or Organizations: Yes    Attends Engineer, structural: More than 4 times per year    Marital Status:  Married    Objective:  BP 120/64   Pulse 76   Temp 98.1 F (36.7 C)   Ht 5' 5 (1.651 m)   Wt 213 lb (96.6 kg)   LMP  (LMP Unknown)   SpO2 92%   BMI 35.45 kg/m      08/12/2023    2:38 PM 04/08/2023    2:13 PM 12/31/2022   11:34 AM  BP/Weight  Systolic BP 120 104 106  Diastolic BP 64 80 70  Wt. (Lbs) 213 212 216.6  BMI 35.45 kg/m2 35.28 kg/m2 36.04 kg/m2    Physical Exam Vitals reviewed.  Constitutional:      Appearance: Normal appearance. She is obese.  Neck:     Vascular: No carotid bruit.   Cardiovascular:     Rate and Rhythm: Normal rate and regular rhythm.     Heart sounds: Normal heart sounds.  Pulmonary:     Effort: Pulmonary effort is normal. No respiratory distress.     Breath sounds: Normal breath sounds.  Abdominal:     General: Abdomen is flat. Bowel sounds are normal.     Palpations: Abdomen is soft.     Tenderness: There is no abdominal tenderness.   Musculoskeletal:        General: Tenderness (lumbar) present.   Neurological:     Mental Status: She is alert and oriented to person, place, and time.  Psychiatric:        Mood and Affect: Mood normal.        Behavior: Behavior normal.     Diabetic Foot Exam - Simple   No data filed      Lab Results  Component Value Date   WBC 10.2 08/05/2023   HGB 14.8 08/05/2023   HCT 46.1 08/05/2023   PLT 327 08/05/2023   GLUCOSE 137 (H) 08/05/2023   CHOL 203 (H) 08/05/2023   TRIG 231 (H) 08/05/2023   HDL 40 08/05/2023   LDLCALC 122 (H) 08/05/2023   ALT 21 08/05/2023   AST 22 08/05/2023   NA 143 08/05/2023   K 5.3 (H) 08/05/2023   CL 103 08/05/2023   CREATININE 0.92 08/05/2023   BUN 20 08/05/2023   CO2 24 08/05/2023   TSH 2.280 08/05/2023   HGBA1C 6.3 (H) 08/05/2023      Assessment & Plan:  Essential hypertension, benign Assessment & Plan: Well controlled.  No changes to medicines. Currently taking Metoprolol  25 mg daily and furosemide  40 mg daily.  Continue to work on eating a  healthy diet and exercise.     Secondary hypothyroidism Assessment & Plan: Previously well controlled Continue Synthroid  at current dose     Vitamin D  insufficiency Assessment & Plan: Not at goal. Continue vitamin D  50 K twice weekly.  Orders: -     Vitamin D  (Ergocalciferol ); One twice a week.  Dispense: 24 capsule; Refill: 1  Combined hyperlipidemia associated with type 2 diabetes mellitus (HCC) Assessment & Plan: Hyperlipidemia not at goal. triglycerides increased. LDL increased. HDL low.  Diabetes well controlled. Recommend check sugars fasting daily. Recommend check feet daily. Recommend annual eye exams. Medicines: fenofibrate  160 mg daily.  Continue to work on eating a healthy diet and exercise.  Labs reviewed.   Orders: -     Amb Referral to Nutrition and Diabetic Education  Gastroesophageal reflux disease without esophagitis Assessment & Plan: The current medical regimen is effective;  continue present plan and medications. Continue on Omeprazole  40 mg daily.     Chronic midline low back pain without sciatica Assessment & Plan: The current medical regimen is effective;  continue present plan and medications. Continue celebrex  200 mg daily, robaxin  500 mg three times a day and tramadol  50 mg 2 oral three daily. Gabapentin  600 mg three times a day.  Management per specialist.     Class 2 severe obesity with body mass index (BMI) of 35 to 39.9 with serious comorbidity (HCC) Assessment & Plan: Recommend continue to work on eating healthy diet and exercise. Comorbidities: hypertension.      Meds ordered this encounter  Medications   Vitamin D , Ergocalciferol , (DRISDOL ) 1.25 MG (50000 UNIT) CAPS capsule    Sig: One twice a week.    Dispense:  24 capsule    Refill:  1    Orders Placed This Encounter  Procedures   Ambulatory referral to diabetic education     Follow-up: Return in about 3 months (around 11/12/2023) for chronic follow up, lab visit  ahead of appointment fasting. Gladys Lamp I Leal-Borjas,acting as a scribe for Mercy Stall, MD.,have documented all relevant documentation on the behalf of Mercy Stall, MD,as directed by  Mercy Stall, MD while in the presence of Mercy Stall, MD.   An After Visit Summary was printed and given to the patient.  Mercy Stall, MD [facility] [phone]

## 2023-08-12 ENCOUNTER — Encounter: Payer: Self-pay | Admitting: Family Medicine

## 2023-08-12 ENCOUNTER — Ambulatory Visit: Admitting: Family Medicine

## 2023-08-12 VITALS — BP 120/64 | HR 76 | Temp 98.1°F | Ht 65.0 in | Wt 213.0 lb

## 2023-08-12 DIAGNOSIS — E1169 Type 2 diabetes mellitus with other specified complication: Secondary | ICD-10-CM | POA: Diagnosis not present

## 2023-08-12 DIAGNOSIS — I10 Essential (primary) hypertension: Secondary | ICD-10-CM

## 2023-08-12 DIAGNOSIS — E038 Other specified hypothyroidism: Secondary | ICD-10-CM

## 2023-08-12 DIAGNOSIS — G8929 Other chronic pain: Secondary | ICD-10-CM

## 2023-08-12 DIAGNOSIS — K219 Gastro-esophageal reflux disease without esophagitis: Secondary | ICD-10-CM | POA: Diagnosis not present

## 2023-08-12 DIAGNOSIS — E559 Vitamin D deficiency, unspecified: Secondary | ICD-10-CM | POA: Diagnosis not present

## 2023-08-12 DIAGNOSIS — M545 Low back pain, unspecified: Secondary | ICD-10-CM | POA: Diagnosis not present

## 2023-08-12 DIAGNOSIS — E782 Mixed hyperlipidemia: Secondary | ICD-10-CM | POA: Diagnosis not present

## 2023-08-12 DIAGNOSIS — E66812 Obesity, class 2: Secondary | ICD-10-CM | POA: Diagnosis not present

## 2023-08-12 MED ORDER — VITAMIN D (ERGOCALCIFEROL) 1.25 MG (50000 UNIT) PO CAPS: ORAL_CAPSULE | ORAL | 1 refills | Status: DC

## 2023-08-14 DIAGNOSIS — M5115 Intervertebral disc disorders with radiculopathy, thoracolumbar region: Secondary | ICD-10-CM | POA: Diagnosis not present

## 2023-08-14 DIAGNOSIS — M5415 Radiculopathy, thoracolumbar region: Secondary | ICD-10-CM | POA: Diagnosis not present

## 2023-08-16 NOTE — Assessment & Plan Note (Signed)
The current medical regimen is effective;  continue present plan and medications.

## 2023-08-16 NOTE — Assessment & Plan Note (Addendum)
 Hyperlipidemia not at goal. triglycerides increased. LDL increased. HDL low.  Diabetes well controlled. Recommend check sugars fasting daily. Recommend check feet daily. Recommend annual eye exams. Medicines: fenofibrate  160 mg daily.  Continue to work on eating a healthy diet and exercise.  Labs reviewed.

## 2023-08-16 NOTE — Assessment & Plan Note (Signed)
 Recommend continue to work on eating healthy diet and exercise. Comorbidities: hypertension

## 2023-08-16 NOTE — Assessment & Plan Note (Signed)
 Well controlled.  No changes to medicines. Currently taking Metoprolol  25 mg daily and furosemide  40 mg daily.  Continue to work on eating a healthy diet and exercise.

## 2023-08-16 NOTE — Assessment & Plan Note (Signed)
 Previously well controlled Continue Synthroid at current dose

## 2023-08-16 NOTE — Assessment & Plan Note (Signed)
 The current medical regimen is effective;  continue present plan and medications. Continue celebrex  200 mg daily, robaxin  500 mg three times a day and tramadol  50 mg 2 oral three daily. Gabapentin  600 mg three times a day.  Management per specialist.

## 2023-08-16 NOTE — Assessment & Plan Note (Addendum)
 Not at goal. Continue vitamin D  50 K twice weekly.

## 2023-08-20 ENCOUNTER — Other Ambulatory Visit: Payer: Self-pay | Admitting: Family Medicine

## 2023-08-20 DIAGNOSIS — E559 Vitamin D deficiency, unspecified: Secondary | ICD-10-CM

## 2023-08-20 DIAGNOSIS — G8929 Other chronic pain: Secondary | ICD-10-CM

## 2023-08-24 ENCOUNTER — Other Ambulatory Visit: Payer: Self-pay | Admitting: Family Medicine

## 2023-08-24 DIAGNOSIS — E038 Other specified hypothyroidism: Secondary | ICD-10-CM

## 2023-09-03 DIAGNOSIS — E782 Mixed hyperlipidemia: Secondary | ICD-10-CM | POA: Diagnosis not present

## 2023-09-03 DIAGNOSIS — E1169 Type 2 diabetes mellitus with other specified complication: Secondary | ICD-10-CM | POA: Diagnosis not present

## 2023-09-11 ENCOUNTER — Other Ambulatory Visit: Payer: Self-pay | Admitting: Family Medicine

## 2023-09-13 ENCOUNTER — Other Ambulatory Visit: Payer: Self-pay | Admitting: Family Medicine

## 2023-09-17 ENCOUNTER — Other Ambulatory Visit: Payer: Self-pay | Admitting: Family Medicine

## 2023-09-24 ENCOUNTER — Other Ambulatory Visit: Payer: Self-pay | Admitting: Family Medicine

## 2023-10-02 ENCOUNTER — Other Ambulatory Visit: Payer: Self-pay

## 2023-10-02 NOTE — Telephone Encounter (Signed)
 I have fix the medication and pend it, it just need your approval   Copied from CRM [id number]. Topic: Clinical - Prescription Issue >> Oct 02, 2023 11:25 AM Winona R wrote: Pt states the pharmacy has reached out to her, informing her that her traMADol  (ULTRAM ) 50 MG tablet order is missing some information. She does not know what it was missing but they asked her to reach out to the office to notify of the request.

## 2023-10-03 MED ORDER — TRAMADOL HCL 50 MG PO TABS: 100.0000 mg | ORAL_TABLET | Freq: Three times a day (TID) | ORAL | 2 refills | Status: AC | PRN

## 2023-10-13 DIAGNOSIS — M25552 Pain in left hip: Secondary | ICD-10-CM | POA: Diagnosis not present

## 2023-11-12 ENCOUNTER — Other Ambulatory Visit: Payer: Self-pay

## 2023-11-12 DIAGNOSIS — E038 Other specified hypothyroidism: Secondary | ICD-10-CM

## 2023-11-12 DIAGNOSIS — I10 Essential (primary) hypertension: Secondary | ICD-10-CM

## 2023-11-12 DIAGNOSIS — E782 Mixed hyperlipidemia: Secondary | ICD-10-CM

## 2023-11-12 DIAGNOSIS — E559 Vitamin D deficiency, unspecified: Secondary | ICD-10-CM

## 2023-11-12 DIAGNOSIS — E1169 Type 2 diabetes mellitus with other specified complication: Secondary | ICD-10-CM

## 2023-11-13 ENCOUNTER — Other Ambulatory Visit

## 2023-11-14 ENCOUNTER — Other Ambulatory Visit

## 2023-11-14 DIAGNOSIS — E559 Vitamin D deficiency, unspecified: Secondary | ICD-10-CM

## 2023-11-14 DIAGNOSIS — E038 Other specified hypothyroidism: Secondary | ICD-10-CM

## 2023-11-14 DIAGNOSIS — E1169 Type 2 diabetes mellitus with other specified complication: Secondary | ICD-10-CM | POA: Diagnosis not present

## 2023-11-14 DIAGNOSIS — E782 Mixed hyperlipidemia: Secondary | ICD-10-CM | POA: Diagnosis not present

## 2023-11-14 DIAGNOSIS — I10 Essential (primary) hypertension: Secondary | ICD-10-CM

## 2023-11-15 LAB — COMPREHENSIVE METABOLIC PANEL WITH GFR
ALT: 14 IU/L (ref 0–32)
AST: 18 IU/L (ref 0–40)
Albumin: 4 g/dL (ref 3.9–4.9)
Alkaline Phosphatase: 57 IU/L (ref 44–121)
BUN/Creatinine Ratio: 19 (ref 12–28)
BUN: 18 mg/dL (ref 8–27)
Bilirubin Total: 0.3 mg/dL (ref 0.0–1.2)
CO2: 29 mmol/L (ref 20–29)
Calcium: 9.6 mg/dL (ref 8.7–10.3)
Chloride: 101 mmol/L (ref 96–106)
Creatinine, Ser: 0.97 mg/dL (ref 0.57–1.00)
Globulin, Total: 2.4 g/dL (ref 1.5–4.5)
Glucose: 109 mg/dL — ABNORMAL HIGH (ref 70–99)
Potassium: 5.2 mmol/L (ref 3.5–5.2)
Sodium: 141 mmol/L (ref 134–144)
Total Protein: 6.4 g/dL (ref 6.0–8.5)
eGFR: 64 mL/min/1.73 (ref >59)

## 2023-11-15 LAB — CBC WITH DIFFERENTIAL/PLATELET
Basophils Absolute: 0.2 x10E3/uL (ref 0.0–0.2)
Basos: 2 %
EOS (ABSOLUTE): 0.4 x10E3/uL (ref 0.0–0.4)
Eos: 4 %
Hematocrit: 45.5 % (ref 34.0–46.6)
Hemoglobin: 14.8 g/dL (ref 11.1–15.9)
Immature Grans (Abs): 0.1 x10E3/uL (ref 0.0–0.1)
Immature Granulocytes: 1 %
Lymphocytes Absolute: 4.2 x10E3/uL — ABNORMAL HIGH (ref 0.7–3.1)
Lymphs: 43 %
MCH: 30 pg (ref 26.6–33.0)
MCHC: 32.5 g/dL (ref 31.5–35.7)
MCV: 92 fL (ref 79–97)
Monocytes Absolute: 0.7 x10E3/uL (ref 0.1–0.9)
Monocytes: 7 %
Neutrophils Absolute: 4.3 x10E3/uL (ref 1.4–7.0)
Neutrophils: 43 %
Platelets: 351 x10E3/uL (ref 150–450)
RBC: 4.94 x10E6/uL (ref 3.77–5.28)
RDW: 12.6 % (ref 11.7–15.4)
WBC: 9.8 x10E3/uL (ref 3.4–10.8)

## 2023-11-15 LAB — LIPID PANEL
Chol/HDL Ratio: 4.1 ratio (ref 0.0–4.4)
Cholesterol, Total: 180 mg/dL (ref 100–199)
HDL: 44 mg/dL (ref >39)
LDL Chol Calc (NIH): 103 mg/dL — ABNORMAL HIGH (ref 0–99)
Triglycerides: 188 mg/dL — ABNORMAL HIGH (ref 0–149)
VLDL Cholesterol Cal: 33 mg/dL (ref 5–40)

## 2023-11-15 LAB — HEMOGLOBIN A1C
Est. average glucose Bld gHb Est-mCnc: 143 mg/dL
Hgb A1c MFr Bld: 6.6 % — ABNORMAL HIGH (ref 4.8–5.6)

## 2023-11-15 LAB — VITAMIN D 25 HYDROXY (VIT D DEFICIENCY, FRACTURES): Vit D, 25-Hydroxy: 42 ng/mL (ref 30.0–100.0)

## 2023-11-15 LAB — TSH: TSH: 8.91 u[IU]/mL — ABNORMAL HIGH (ref 0.450–4.500)

## 2023-11-16 ENCOUNTER — Ambulatory Visit: Payer: Self-pay | Admitting: Family Medicine

## 2023-11-18 ENCOUNTER — Ambulatory Visit: Admitting: Family Medicine

## 2023-11-18 ENCOUNTER — Encounter: Payer: Self-pay | Admitting: Family Medicine

## 2023-11-18 VITALS — BP 124/78 | HR 74 | Temp 98.2°F | Ht 65.0 in | Wt 216.0 lb

## 2023-11-18 DIAGNOSIS — J301 Allergic rhinitis due to pollen: Secondary | ICD-10-CM

## 2023-11-18 DIAGNOSIS — M810 Age-related osteoporosis without current pathological fracture: Secondary | ICD-10-CM | POA: Diagnosis not present

## 2023-11-18 DIAGNOSIS — I10 Essential (primary) hypertension: Secondary | ICD-10-CM | POA: Diagnosis not present

## 2023-11-18 DIAGNOSIS — E66812 Obesity, class 2: Secondary | ICD-10-CM

## 2023-11-18 DIAGNOSIS — G8929 Other chronic pain: Secondary | ICD-10-CM | POA: Diagnosis not present

## 2023-11-18 DIAGNOSIS — K219 Gastro-esophageal reflux disease without esophagitis: Secondary | ICD-10-CM

## 2023-11-18 DIAGNOSIS — E559 Vitamin D deficiency, unspecified: Secondary | ICD-10-CM | POA: Diagnosis not present

## 2023-11-18 DIAGNOSIS — E038 Other specified hypothyroidism: Secondary | ICD-10-CM

## 2023-11-18 DIAGNOSIS — E1169 Type 2 diabetes mellitus with other specified complication: Secondary | ICD-10-CM

## 2023-11-18 DIAGNOSIS — M545 Low back pain, unspecified: Secondary | ICD-10-CM

## 2023-11-18 DIAGNOSIS — Z1231 Encounter for screening mammogram for malignant neoplasm of breast: Secondary | ICD-10-CM

## 2023-11-18 MED ORDER — LEVOTHYROXINE SODIUM 150 MCG PO TABS: 150.0000 ug | ORAL_TABLET | Freq: Every day | ORAL | 0 refills | Status: DC

## 2023-11-18 MED ORDER — VITAMIN D (ERGOCALCIFEROL) 1.25 MG (50000 UNIT) PO CAPS: ORAL_CAPSULE | ORAL | 3 refills | Status: AC

## 2023-11-18 NOTE — Progress Notes (Unsigned)
 Subjective:  Patient ID: Caitlin Ho, female    DOB: [birth year]  Age: 68 y.o. MRN: [id number]  Chief Complaint  Patient presents with   Medical Management of Chronic Issues    HPI: Discussed the use of AI scribe software for clinical note transcription with the patient, who gave verbal consent to proceed.  History of Present Illness Caitlin Ho is a 68 year old female who presents for follow-up on her thyroid  function and diabetes management.  Thyroid  dysfunction - Previous abnormal thyroid  levels led to an increase in levothyroxine  to 150 mcg daily - Weight gain of 3 pounds since last visit - Dietary changes include increased fish intake and overall improved eating habits  Diabetes mellitus and glycemic control - HbA1c increased to 6.6% from 6.3%, with historical fluctuation between 6.1% and 6.7% over the past three years - No significant dietary changes to account for HbA1c increase; decreased bread and pasta intake - Weight gain since last visit - No current or prior use of diabetes medications - No daily blood glucose monitoring - Fasting blood glucose values around 98 mg/dL - Postprandial blood glucose values between 136 and 148 mg/dL  Chronic back pain and radiculopathy - Chronic back pain managed with gabapentin  600 mg TID, methocarbamol  500 mg TID, celecoxib  200 mg daily, and tramadol  50 mg 2-3 times daily as needed - History of steroid injections in hip and back without relief - Nerve pain radiating down leg, consistent with sciatica - Occasional 'zing' sensations in foot - No numbness or burning in feet  Peripheral edema - Swelling present, particularly in the mornings - On furosemide  40 mg daily for swelling - No use of compression socks since hip surgery due to difficulty putting them on  Hypertension and cardiovascular risk management - On metoprolol  tartrate 25 mg BID for blood pressure control - On fenofibrate  160 mg daily for  hypertriglyceridemia  Osteoporosis - On alendronate  once weekly for osteoporosis  Gastroesophageal reflux disease - On omeprazole  40 mg daily for acid reflux  Allergic rhinitis - On cetirizine for allergies  Vitamin d  deficiency - On vitamin D  supplementation twice weekly, increased at last visit - Most recent vitamin D  level was 42  Headache - Frequent headaches, suspected to be related to back issues       08/12/2023    2:43 PM 12/31/2022   11:39 AM 09/23/2022    9:33 AM 06/03/2022    9:35 AM 04/01/2022    2:05 PM  Depression screen PHQ 2/9  Decreased Interest 0 0 0 0 0  Down, Depressed, Hopeless 0 0 0 0 0  PHQ - 2 Score 0 0 0 0 0  Altered sleeping  0 0    Tired, decreased energy  1 1    Change in appetite  1 0    Feeling bad or failure about yourself   0 0    Trouble concentrating  1 0    Moving slowly or fidgety/restless  0 0    Suicidal thoughts  0 0    PHQ-9 Score  3 1    Difficult doing work/chores  Not difficult at all Not difficult at all          08/12/2023    2:45 PM  Fall Risk   Falls in the past year? 0  Number falls in past yr: 0  Injury with Fall? 0  Risk for fall due to : No Fall Risks  Follow up Falls evaluation completed  Patient Care Team: Sherre Clapper, MD as PCP - General (Family Medicine) Erasmo Bernardino BRAVO, OD (Optometry) Colon Shove, MD as Consulting Physician (Neurosurgery) Specialists, [facility] (Orthopedic Surgery)   Review of Systems  Constitutional:  Negative for chills, fatigue and fever.  HENT:  Negative for congestion, ear pain and sore throat.   Respiratory:  Negative for cough and shortness of breath.   Cardiovascular:  Negative for chest pain.  Gastrointestinal:  Negative for abdominal pain, constipation, diarrhea, nausea and vomiting.  Genitourinary:  Negative for dysuria and urgency.  Musculoskeletal:  Negative for arthralgias and myalgias.  Skin:  Negative for rash.  Neurological:  Negative for dizziness  and headaches.  Psychiatric/Behavioral:  Negative for dysphoric mood. The patient is not nervous/anxious.     Current Outpatient Medications on File Prior to Visit  Medication Sig Dispense Refill   acetaminophen  (TYLENOL ) 500 MG tablet Take 2 tablets (1,000 mg total) by mouth every 6 (six) hours as needed for mild pain or moderate pain. 60 tablet 0   alendronate  (FOSAMAX ) 70 MG tablet TAKE 1 TABLET EVERY 7 DAYS ON AN EMPTY STOMACH WITH A FULL GLASS OF WATER  12 tablet 3   aspirin  EC 81 MG tablet Take 1 tablet (81 mg total) by mouth daily. 30 tablet 11   celecoxib  (CELEBREX ) 200 MG capsule TAKE 1 CAPSULE EVERY DAY 90 capsule 3   cetirizine (ZYRTEC) 10 MG tablet Take 10 mg by mouth daily.     fenofibrate  160 MG tablet TAKE 1 TABLET EVERY DAY 90 tablet 3   furosemide  (LASIX ) 40 MG tablet TAKE 1 TABLET EVERY DAY 90 tablet 3   gabapentin  (NEURONTIN ) 600 MG tablet TAKE 1 TABLET THREE TIMES DAILY 270 tablet 3   methocarbamol  (ROBAXIN ) 500 MG tablet TAKE 1 TABLET THREE TIMES DAILY 270 tablet 3   metoprolol  tartrate (LOPRESSOR ) 25 MG tablet TAKE 1 TABLET TWICE DAILY 180 tablet 3   omeprazole  (PRILOSEC) 40 MG capsule TAKE 1 CAPSULE EVERY DAY 90 capsule 3   polyethylene glycol (MIRALAX  / GLYCOLAX ) 17 g packet Take 17 g by mouth daily as needed for moderate constipation.     traMADol  (ULTRAM ) 50 MG tablet Take 2 tablets (100 mg total) by mouth every 8 (eight) hours as needed for severe pain (pain score 7-10). ICD Code: M54.50 180 tablet 2   No current facility-administered medications on file prior to visit.   Past Medical History:  Diagnosis Date   Cancer (HCC)    cervical cancer cells whwn patient was 22    Cervicalgia    Fibromyalgia    GERD (gastroesophageal reflux disease)    not on medication   Hashimoto's disease    Headache(784.0)    sinus   Homonymous hemianopia    Hyperlipidemia    Hypertension    Hypothyroid    Other sequelae of cerebral infarction    PONV (postoperative nausea  and vomiting)    06/20/14 patient reported that BP dropped real low and she had N/V  and was slow to awaken when she had surgery at an outpatient center.   Pre-diabetes    Seasonal allergies    Sleep apnea    no CPAP   Stroke (HCC) 2019   Vitamin D  deficiency    Past Surgical History:  Procedure Laterality Date   ABDOMINAL HYSTERECTOMY  1995   APPENDECTOMY  1978   BACK SURGERY  06/29/2020   L5-S1   CERVICAL FUSION  2017   2 levels   DILATION AND CURETTAGE  OF UTERUS     LUMBAR DISC SURGERY  10/03/2012   Dr Colon  2 Discectomy   LUMBAR DISC SURGERY  07/2012   LUMBAR FUSION  2015   Fusion (2 levels)   LUMBAR FUSION  2016   fusion 1 level.   LUMBAR FUSION  02/16/2013   Fusion lumbar.   THYROIDECTOMY  2006   TOTAL HIP ARTHROPLASTY Right 07/03/2021   Procedure: TOTAL HIP ARTHROPLASTY ANTERIOR APPROACH;  Surgeon: Beverley Evalene BIRCH, MD;  Location: [facility];  Service: Orthopedics;  Laterality: Right;   TUBAL LIGATION  1990    Family History  Problem Relation Age of Onset   Aneurysm Mother        brain   Heart attack Father 78   Cancer Paternal Grandmother        Breast   Hyperlipidemia Other    Hypertension Other    Diabetes Other    Social History   Socioeconomic History   Marital status: Married    Spouse name: Ubaldo   Number of children: 1   Years of education: Not on file   Highest education level: Not on file  Occupational History   Occupation: Disabled  Tobacco Use   Smoking status: Never   Smokeless tobacco: Never  Vaping Use   Vaping status: Never Used  Substance and Sexual Activity   Alcohol use: No   Drug use: No   Sexual activity: Yes    Partners: Male    Birth control/protection: None  Other Topics Concern   Not on file  Social History Narrative   Not on file   Social Drivers of Health   Financial Resource Strain: Low Risk  (08/12/2023)   Overall Financial Resource Strain (CARDIA)    Difficulty of Paying Living Expenses: Not hard at all  Food  Insecurity: No Food Insecurity (08/12/2023)   Hunger Vital Sign    Worried About Running Out of Food in the Last Year: Never true    Ran Out of Food in the Last Year: Never true  Transportation Needs: No Transportation Needs (08/12/2023)   PRAPARE - Administrator, Civil Service (Medical): No    Lack of Transportation (Non-Medical): No  Physical Activity: Inactive (08/12/2023)   Exercise Vital Sign    Days of Exercise per Week: 0 days    Minutes of Exercise per Session: 0 min  Stress: No Stress Concern Present (08/12/2023)   [employer] of Occupational Health - Occupational Stress Questionnaire    Feeling of Stress : Not at all  Social Connections: Socially Integrated (08/12/2023)   Social Connection and Isolation Panel    Frequency of Communication with Friends and Family: More than three times a week    Frequency of Social Gatherings with Friends and Family: Once a week    Attends Religious Services: More than 4 times per year    Active Member of [employer] or Organizations: Yes    Attends Engineer, structural: More than 4 times per year    Marital Status: Married    Objective:  BP 124/78   Pulse 74   Temp 98.2 F (36.8 C)   Ht 5' 5 (1.651 m)   Wt 216 lb (98 kg)   LMP  (LMP Unknown)   SpO2 92%   BMI 35.94 kg/m      11/18/2023    1:57 PM 08/12/2023    2:38 PM 04/08/2023    2:13 PM  BP/Weight  Systolic BP 124 120 104  Diastolic BP 78 64 80  Wt. (Lbs) 216 213 212  BMI 35.94 kg/m2 35.45 kg/m2 35.28 kg/m2    Physical Exam Vitals reviewed.  Constitutional:      Appearance: Normal appearance. She is obese.  Neck:     Vascular: No carotid bruit.  Cardiovascular:     Rate and Rhythm: Normal rate and regular rhythm.     Pulses: Normal pulses.     Heart sounds: Normal heart sounds.  Pulmonary:     Effort: Pulmonary effort is normal. No respiratory distress.     Breath sounds: Normal breath sounds.  Abdominal:     General: Abdomen is flat. Bowel  sounds are normal.     Palpations: Abdomen is soft.     Tenderness: There is no abdominal tenderness.  Neurological:     Mental Status: She is alert and oriented to person, place, and time.  Psychiatric:        Mood and Affect: Mood normal.        Behavior: Behavior normal.    Diabetic Foot Exam - Simple   Simple Foot Form Diabetic Foot exam was performed with the following findings: Yes 11/18/2023  2:22 PM  Visual Inspection No deformities, no ulcerations, no other skin breakdown bilaterally: Yes Sensation Testing Intact to touch and monofilament testing bilaterally: Yes Pulse Check Posterior Tibialis and Dorsalis pulse intact bilaterally: Yes Comments        Lab Results  Component Value Date   WBC 9.8 11/14/2023   HGB 14.8 11/14/2023   HCT 45.5 11/14/2023   PLT 351 11/14/2023   GLUCOSE 109 (H) 11/14/2023   CHOL 180 11/14/2023   TRIG 188 (H) 11/14/2023   HDL 44 11/14/2023   LDLCALC 103 (H) 11/14/2023   ALT 14 11/14/2023   AST 18 11/14/2023   NA 141 11/14/2023   K 5.2 11/14/2023   CL 101 11/14/2023   CREATININE 0.97 11/14/2023   BUN 18 11/14/2023   CO2 29 11/14/2023   TSH 8.910 (H) 11/14/2023   HGBA1C 6.6 (H) 11/14/2023      Assessment & Plan:  Chronic midline low back pain without sciatica  Secondary hypothyroidism Assessment & Plan: - Increase levothyroxine  to 150 mcg daily. - Recheck thyroid  levels in six weeks.  Orders: -     Levothyroxine  Sodium; Take 1 tablet (150 mcg total) by mouth daily before breakfast.  Dispense: 90 tablet; Refill: 0  Combined hyperlipidemia associated with type 2 diabetes mellitus (HCC)  Essential hypertension, benign  Age-related osteoporosis without current pathological fracture  Seasonal allergic rhinitis due to pollen  Gastroesophageal reflux disease without esophagitis  Vitamin D  insufficiency -     Vitamin D  (Ergocalciferol ); TAKE 1 CAPSULE EVERY 7 DAYS  Dispense: 12 capsule; Refill: 3  Encounter for screening  mammogram for malignant neoplasm of breast -     Digital Screening Mammogram, Left and Right; Future  Class 2 severe obesity with body mass index (BMI) of 35 to 39.9 with serious comorbidity (HCC)     Body mass index is 35.94 kg/m.   Assessment and Plan Assessment & Plan Chronic back pain with lumbar radiculopathy Chronic back pain with sciatica symptoms. Current medications include gabapentin , methocarbamol , Celebrex , tramadol , and acetaminophen . Recent steroid injections were ineffective. - Continue current pain management regimen. - Consult with Doctor Elsner regarding further management options, including potential surgery.  Hypothyroidism Thyroid  levels are off, necessitating an increase in levothyroxine  dosage. - Increase levothyroxine  to 150 mcg daily. - Recheck thyroid  levels in six weeks.  Type 2 diabetes mellitus, not on medication A1c increased to 6.6 from 6.3, possibly due to recent steroid injections. No current need for diabetes medication. - Monitor A1c and consider weight loss strategies. - Discuss potential diabetes medications that aid in weight loss if needed.  Edema Persistent swelling despite furosemide . - Continue furosemide  40 mg once daily.  Hypertension Blood pressure managed with metoprolol  tartrate 25 mg twice daily.  Hyperlipidemia Improvement in lipid profile with current management. - Continue current lipid management regimen.  Osteoporosis Currently managed with alendronate .  Allergic rhinitis Managed with cetirizine.  Gastroesophageal reflux disease (GERD) Managed with omeprazole  40 mg once daily.  Vitamin D  deficiency, on supplementation Vitamin D  levels are stable at 42. - Continue vitamin D  supplementation twice a week.  Obesity with BMI 35-39  -comorbidity: diabetes. -continue to work on eating healthy! Difficulty exercising due to back pain.     Meds ordered this encounter  Medications   levothyroxine  (SYNTHROID ) 150  MCG tablet    Sig: Take 1 tablet (150 mcg total) by mouth daily before breakfast.    Dispense:  90 tablet    Refill:  0   Vitamin D , Ergocalciferol , (DRISDOL ) 1.25 MG (50000 UNIT) CAPS capsule    Sig: TAKE 1 CAPSULE EVERY 7 DAYS    Dispense:  12 capsule    Refill:  3    Orders Placed This Encounter  Procedures   MM DIGITAL SCREENING BILATERAL       Follow-up: Return in about 4 months (around 03/19/2024) for chronic follow up, lab visit (tsh, free T4).    An After Visit Summary was printed and given to the patient.  Abigail Free, MD [facility] [phone]

## 2023-11-18 NOTE — Patient Instructions (Signed)
  VISIT SUMMARY: Today, we reviewed your thyroid  function, diabetes management, and other ongoing health issues. We made some adjustments to your medications and discussed further management options for your chronic back pain.  YOUR PLAN: CHRONIC BACK PAIN WITH LUMBAR RADICULOPATHY: You have chronic back pain with symptoms of sciatica, and your current medications include gabapentin , methocarbamol , Celebrex , tramadol , and acetaminophen . -Continue your current pain management regimen. -Consult with Doctor Elsner regarding further management options, including potential surgery.  HYPOTHYROIDISM: Your thyroid  levels are off, so we need to adjust your medication. -Increase levothyroxine  to 150 mcg daily. -Recheck thyroid  levels in six weeks. -Your levothyroxine  prescription will be sent to [facility].  TYPE 2 DIABETES MELLITUS, NOT ON MEDICATION: Your A1c has increased to 6.6 from 6.3, possibly due to recent steroid injections. -Monitor A1c and consider weight loss strategies. -Discuss potential diabetes medications that aid in weight loss if needed.  EDEMA: You have persistent swelling despite taking furosemide . -Continue furosemide  40 mg once daily.  HYPERTENSION: Your blood pressure is managed with metoprolol  tartrate. -Continue metoprolol  tartrate 25 mg twice daily.  HYPERLIPIDEMIA: Your lipid profile has improved with current management. -Continue current lipid management regimen.  OSTEOPOROSIS: You are currently managed with alendronate . -Continue taking alendronate  as prescribed.  ALLERGIC RHINITIS: Your allergies are managed with cetirizine. -Continue taking cetirizine as needed.  GASTROESOPHAGEAL REFLUX DISEASE (GERD): Your acid reflux is managed with omeprazole . -Continue taking omeprazole  40 mg once daily.  VITAMIN D  DEFICIENCY: Your vitamin D  levels are stable with supplementation. -Continue vitamin D  supplementation twice a  week.                      Contains text generated by [employer]bridge.                                 Contains text generated by Abridge.

## 2023-11-19 DIAGNOSIS — M5416 Radiculopathy, lumbar region: Secondary | ICD-10-CM | POA: Diagnosis not present

## 2023-11-19 DIAGNOSIS — J301 Allergic rhinitis due to pollen: Secondary | ICD-10-CM | POA: Insufficient documentation

## 2023-11-19 DIAGNOSIS — Z6834 Body mass index (BMI) 34.0-34.9, adult: Secondary | ICD-10-CM | POA: Diagnosis not present

## 2023-11-19 HISTORY — DX: Allergic rhinitis due to pollen: J30.1

## 2023-11-19 NOTE — Assessment & Plan Note (Signed)
-   Increase levothyroxine  to 150 mcg daily. - Recheck thyroid  levels in six weeks.

## 2023-11-20 ENCOUNTER — Encounter (HOSPITAL_COMMUNITY): Payer: Self-pay | Admitting: Neurological Surgery

## 2023-11-20 ENCOUNTER — Other Ambulatory Visit: Payer: Self-pay | Admitting: Neurological Surgery

## 2023-11-24 ENCOUNTER — Other Ambulatory Visit (HOSPITAL_COMMUNITY): Payer: Self-pay | Admitting: Neurological Surgery

## 2023-11-24 DIAGNOSIS — G959 Disease of spinal cord, unspecified: Secondary | ICD-10-CM

## 2023-12-03 ENCOUNTER — Ambulatory Visit (HOSPITAL_COMMUNITY)
Admission: RE | Admit: 2023-12-03 | Discharge: 2023-12-03 | Disposition: A | Discharge status: Home / Self Care | Source: Ambulatory Visit | Attending: Neurological Surgery | Admitting: Neurological Surgery

## 2023-12-03 ENCOUNTER — Other Ambulatory Visit (HOSPITAL_COMMUNITY): Payer: Self-pay | Admitting: Neurological Surgery

## 2023-12-03 DIAGNOSIS — I700 Atherosclerosis of aorta: Secondary | ICD-10-CM | POA: Diagnosis not present

## 2023-12-03 DIAGNOSIS — M4720 Other spondylosis with radiculopathy, site unspecified: Secondary | ICD-10-CM | POA: Insufficient documentation

## 2023-12-03 DIAGNOSIS — M4804 Spinal stenosis, thoracic region: Secondary | ICD-10-CM | POA: Insufficient documentation

## 2023-12-03 DIAGNOSIS — G959 Disease of spinal cord, unspecified: Secondary | ICD-10-CM

## 2023-12-03 DIAGNOSIS — D3502 Benign neoplasm of left adrenal gland: Secondary | ICD-10-CM | POA: Insufficient documentation

## 2023-12-03 DIAGNOSIS — M4802 Spinal stenosis, cervical region: Secondary | ICD-10-CM | POA: Diagnosis not present

## 2023-12-03 DIAGNOSIS — M4712 Other spondylosis with myelopathy, cervical region: Secondary | ICD-10-CM | POA: Diagnosis not present

## 2023-12-03 DIAGNOSIS — K760 Fatty (change of) liver, not elsewhere classified: Secondary | ICD-10-CM | POA: Diagnosis not present

## 2023-12-03 DIAGNOSIS — M5124 Other intervertebral disc displacement, thoracic region: Secondary | ICD-10-CM | POA: Diagnosis not present

## 2023-12-03 DIAGNOSIS — M47812 Spondylosis without myelopathy or radiculopathy, cervical region: Secondary | ICD-10-CM | POA: Diagnosis not present

## 2023-12-03 DIAGNOSIS — M2578 Osteophyte, vertebrae: Secondary | ICD-10-CM | POA: Insufficient documentation

## 2023-12-03 DIAGNOSIS — M4312 Spondylolisthesis, cervical region: Secondary | ICD-10-CM | POA: Insufficient documentation

## 2023-12-03 DIAGNOSIS — M8008XA Age-related osteoporosis with current pathological fracture, vertebra(e), initial encounter for fracture: Secondary | ICD-10-CM | POA: Diagnosis not present

## 2023-12-03 DIAGNOSIS — M4314 Spondylolisthesis, thoracic region: Secondary | ICD-10-CM | POA: Diagnosis not present

## 2023-12-03 MED ORDER — IOHEXOL 300 MG/ML  SOLN
10.0000 mL | Freq: Once | INTRAMUSCULAR | Status: AC | PRN
  Administered 2023-12-03: 9 mL via INTRATHECAL

## 2023-12-03 MED ORDER — DIAZEPAM 5 MG PO TABS
10.0000 mg | ORAL_TABLET | Freq: Once | ORAL | Status: AC
Start: 2023-12-03 — End: 2023-12-03
  Administered 2023-12-03: 10 mg via ORAL
  Filled 2023-12-03: qty 2

## 2023-12-03 MED ORDER — LIDOCAINE HCL (PF) 1 % IJ SOLN
5.0000 mL | Freq: Once | INTRAMUSCULAR | Status: AC
  Administered 2023-12-03: 5 mL via INTRADERMAL

## 2023-12-03 MED ORDER — HYDROCODONE-ACETAMINOPHEN 5-325 MG PO TABS: 1.0000 | ORAL_TABLET | ORAL | Status: DC | PRN

## 2023-12-03 MED ORDER — ONDANSETRON HCL 4 MG/2ML IJ SOLN: 4.0000 mg | Freq: Four times a day (QID) | INTRAMUSCULAR | Status: DC | PRN

## 2023-12-03 NOTE — Progress Notes (Signed)
 Discharge instructions reviewed with patient and Husband at bedside. Site remained C/D/I while here at the facility. PT was able to tolerate fluids prior to discharge. PT ambulated in the hallway without complications pt escorted from the unit via wheel chair to personal vehicle

## 2023-12-03 NOTE — Procedures (Signed)
 [name] is a 68 year old individual who was undergone a fusion from T12 to the sacrum.  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  Because of the plethora of hardware that she has in her spine I have advised a myelogram and postmyelogram CAT scan be performed.  Now undergoing this procedure.  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 Post op Dx: Same Procedure: Total myelogram Surgeon: Colon Puncture level: L1-L2 Fluid color: Clear colorless Injection: Iohexol  300, 9 cc Findings: No obstruction to the flow of dye across the thoracolumbar junction and into the cervical spine.  Follow-up CTs of the cervical thoracic and lumbar spines for better definition of any pathoanatomy.

## 2023-12-05 DIAGNOSIS — Z6834 Body mass index (BMI) 34.0-34.9, adult: Secondary | ICD-10-CM | POA: Diagnosis not present

## 2023-12-05 DIAGNOSIS — M546 Pain in thoracic spine: Secondary | ICD-10-CM | POA: Diagnosis not present

## 2023-12-16 DIAGNOSIS — M5414 Radiculopathy, thoracic region: Secondary | ICD-10-CM | POA: Diagnosis not present

## 2023-12-16 DIAGNOSIS — M5114 Intervertebral disc disorders with radiculopathy, thoracic region: Secondary | ICD-10-CM | POA: Diagnosis not present

## 2023-12-30 ENCOUNTER — Other Ambulatory Visit

## 2024-01-14 DIAGNOSIS — M1612 Unilateral primary osteoarthritis, left hip: Secondary | ICD-10-CM | POA: Diagnosis not present

## 2024-01-14 DIAGNOSIS — Z981 Arthrodesis status: Secondary | ICD-10-CM | POA: Diagnosis not present

## 2024-01-28 ENCOUNTER — Other Ambulatory Visit: Payer: Self-pay | Admitting: Family Medicine

## 2024-01-28 DIAGNOSIS — E038 Other specified hypothyroidism: Secondary | ICD-10-CM

## 2024-02-02 ENCOUNTER — Other Ambulatory Visit: Payer: Self-pay

## 2024-02-04 ENCOUNTER — Telehealth: Payer: Self-pay | Admitting: Family Medicine

## 2024-02-04 NOTE — Telephone Encounter (Signed)
 Copied from CRM #[id number]. Topic: Clinical - Prescription Issue >> Feb 04, 2024 10:33 AM Marylynn H wrote: Reason for CRM: Patient states pharmacy received RX for traMADol  (ULTRAM ) 50 MG tablet but it was missing information. Please correct and resubmit, patient is unsure of what was missing, she only has enough to get her through today 12/3.   [id number] - [place], [place] - [address]

## 2024-02-05 ENCOUNTER — Encounter: Payer: Self-pay | Admitting: Family Medicine

## 2024-02-05 ENCOUNTER — Other Ambulatory Visit: Payer: Self-pay | Admitting: Family Medicine

## 2024-02-05 MED ORDER — TRAMADOL HCL 50 MG PO TABS: 100.0000 mg | ORAL_TABLET | Freq: Three times a day (TID) | ORAL | 5 refills | Status: DC | PRN

## 2024-02-05 NOTE — Telephone Encounter (Signed)
Left message for patient to return call back.  

## 2024-02-05 NOTE — Telephone Encounter (Signed)
 Talked with patient and she stated that she did not receive hydrocodone /acetaminophen  from [facility]. I called pharmacy and [facility] and they confirmed that any hydrocodone  was sent to patient lately. Please advice

## 2024-03-09 ENCOUNTER — Ambulatory Visit: Admitting: Physician Assistant

## 2024-03-09 ENCOUNTER — Ambulatory Visit: Admitting: Family Medicine

## 2024-03-09 ENCOUNTER — Encounter: Payer: Self-pay | Admitting: Physician Assistant

## 2024-03-09 VITALS — BP 122/82 | HR 69 | Temp 97.9°F | Ht 65.0 in | Wt 215.8 lb

## 2024-03-09 DIAGNOSIS — E038 Other specified hypothyroidism: Secondary | ICD-10-CM | POA: Diagnosis not present

## 2024-03-09 DIAGNOSIS — I10 Essential (primary) hypertension: Secondary | ICD-10-CM

## 2024-03-09 DIAGNOSIS — G8929 Other chronic pain: Secondary | ICD-10-CM | POA: Diagnosis not present

## 2024-03-09 DIAGNOSIS — E782 Mixed hyperlipidemia: Secondary | ICD-10-CM

## 2024-03-09 DIAGNOSIS — R9431 Abnormal electrocardiogram [ECG] [EKG]: Secondary | ICD-10-CM | POA: Diagnosis not present

## 2024-03-09 DIAGNOSIS — E1169 Type 2 diabetes mellitus with other specified complication: Secondary | ICD-10-CM

## 2024-03-09 DIAGNOSIS — M545 Low back pain, unspecified: Secondary | ICD-10-CM | POA: Diagnosis not present

## 2024-03-09 MED ORDER — TRAMADOL HCL 50 MG PO TABS: ORAL_TABLET | ORAL | 0 refills | Status: AC

## 2024-03-09 NOTE — Progress Notes (Unsigned)
 "  Subjective:  Patient ID: Caitlin Ho, female    DOB: 1956-01-15  Age: 69 y.o. MRN: [id number]  Chief Complaint  Patient presents with   Surgical Clearance    HPI: Discussed the use of AI scribe software for clinical note transcription with the patient, who gave verbal consent to proceed.  History of Present Illness Caitlin Ho is a 69 year old female who presents for surgical clearance for left hip replacement.  She previously underwent right hip replacement surgery in early 2022, which was successful. She is now experiencing significant pain in her left hip, which she reports is described as 'bone on bone' by her previous provider. A recent injection provided only temporary relief, and the pain has been gradually worsening.  She is currently taking tramadol  50 mg, two tablets every eight hours, and extra strength Tylenol  in between doses for pain management. Tramadol  makes her feel 'a little drugged' but not super sleepy. The pain is particularly severe in the morning and worsens throughout the day.  She has a history of seven back surgeries, which she states were not as painful as her current hip pain.  No history of heart attacks, palpitations, or abnormal bowel movements with blood or mucus. No new abdominal pain.  She takes her thyroid  medication in the morning and reports no issues with it. She had an appointment scheduled for thyroid  management, which she canceled to focus on the surgical clearance.          08/12/2023    2:43 PM 12/31/2022   11:39 AM 09/23/2022    9:33 AM 06/03/2022    9:35 AM 04/01/2022    2:05 PM  Depression screen PHQ 2/9  Decreased Interest 0 0 0 0 0  Down, Depressed, Hopeless 0 0 0 0 0  PHQ - 2 Score 0 0 0 0 0  Altered sleeping  0 0    Tired, decreased energy  1 1    Change in appetite  1 0    Feeling bad or failure about yourself   0 0    Trouble concentrating  1 0    Moving slowly or fidgety/restless  0 0    Suicidal thoughts  0 0     PHQ-9 Score  3  1     Difficult doing work/chores  Not difficult at all Not difficult at all       Data saved with a previous flowsheet row definition        08/12/2023    2:45 PM  Fall Risk   Falls in the past year? 0  Number falls in past yr: 0  Injury with Fall? 0   Risk for fall due to : No Fall Risks  Follow up Falls evaluation completed     Data saved with a previous flowsheet row definition    Patient Care Team: Sherre Clapper, MD as PCP - General (Family Medicine) Erasmo Bernardino BRAVO, OD (Optometry) Colon Shove, MD as Consulting Physician (Neurosurgery) Specialists, [facility] (Orthopedic Surgery)   Review of Systems  Constitutional:  Negative for appetite change, fatigue and fever.  HENT:  Negative for congestion, ear pain, sinus pressure and sore throat.   Respiratory:  Negative for cough, chest tightness, shortness of breath and wheezing.   Cardiovascular:  Negative for chest pain and palpitations.  Gastrointestinal:  Negative for abdominal pain, constipation, diarrhea, nausea and vomiting.  Genitourinary:  Negative for dysuria and hematuria.  Musculoskeletal:  Positive for myalgias (Left hip  pain). Negative for arthralgias, back pain and joint swelling.  Skin:  Negative for rash.  Neurological:  Negative for dizziness, weakness and headaches.  Psychiatric/Behavioral:  Negative for dysphoric mood. The patient is not nervous/anxious.     Medications Ordered Prior to Encounter[1] Past Medical History:  Diagnosis Date   Cancer (HCC)    cervical cancer cells whwn patient was 22    Cervicalgia    Fibromyalgia    GERD (gastroesophageal reflux disease)    not on medication   Hashimoto's disease    Headache(784.0)    sinus   Homonymous hemianopia    Hyperlipidemia    Hypertension    Hypothyroid    Other sequelae of cerebral infarction    PONV (postoperative nausea and vomiting)    06/20/14 patient reported that BP dropped real low and she had N/V   and was slow to awaken when she had surgery at an outpatient center.   Pre-diabetes    Seasonal allergies    Sleep apnea    no CPAP   Stroke (HCC) 2019   Vitamin D  deficiency    Past Surgical History:  Procedure Laterality Date   ABDOMINAL HYSTERECTOMY  1995   APPENDECTOMY  1978   BACK SURGERY  06/29/2020   L5-S1   CERVICAL FUSION  2017   2 levels   DILATION AND CURETTAGE OF UTERUS     LUMBAR DISC SURGERY  10/03/2012   Dr Colon  2 Discectomy   LUMBAR DISC SURGERY  07/2012   LUMBAR FUSION  2015   Fusion (2 levels)   LUMBAR FUSION  2016   fusion 1 level.   LUMBAR FUSION  02/16/2013   Fusion lumbar.   THYROIDECTOMY  2006   TOTAL HIP ARTHROPLASTY Right 07/03/2021   Procedure: TOTAL HIP ARTHROPLASTY ANTERIOR APPROACH;  Surgeon: Beverley Evalene BIRCH, MD;  Location: [facility];  Service: Orthopedics;  Laterality: Right;   TUBAL LIGATION  1990    Family History  Problem Relation Age of Onset   Aneurysm Mother        brain   Heart attack Father 30   Cancer Paternal Grandmother        Breast   Hyperlipidemia Other    Hypertension Other    Diabetes Other    Social History   Socioeconomic History   Marital status: Married    Spouse name: Ubaldo   Number of children: 1   Years of education: Not on file   Highest education level: Not on file  Occupational History   Occupation: Disabled  Tobacco Use   Smoking status: Never   Smokeless tobacco: Never  Vaping Use   Vaping status: Never Used  Substance and Sexual Activity   Alcohol use: No   Drug use: No   Sexual activity: Yes    Partners: Male    Birth control/protection: None  Other Topics Concern   Not on file  Social History Narrative   Not on file   Social Drivers of Health   Tobacco Use: Low Risk (03/09/2024)   Patient History    Smoking Tobacco Use: Never    Smokeless Tobacco Use: Never    Passive Exposure: Not on file  Financial Resource Strain: Low Risk (08/12/2023)   Overall Financial Resource Strain (CARDIA)     Difficulty of Paying Living Expenses: Not hard at all  Food Insecurity: No Food Insecurity (08/12/2023)   Hunger Vital Sign    Worried About Running Out of Food in the Last Year: Never  true    Ran Out of Food in the Last Year: Never true  Transportation Needs: No Transportation Needs (08/12/2023)   PRAPARE - Administrator, Civil Service (Medical): No    Lack of Transportation (Non-Medical): No  Physical Activity: Inactive (08/12/2023)   Exercise Vital Sign    Days of Exercise per Week: 0 days    Minutes of Exercise per Session: 0 min  Stress: No Stress Concern Present (08/12/2023)   [employer] of Occupational Health - Occupational Stress Questionnaire    Feeling of Stress : Not at all  Social Connections: Socially Integrated (08/12/2023)   Social Connection and Isolation Panel    Frequency of Communication with Friends and Family: More than three times a week    Frequency of Social Gatherings with Friends and Family: Once a week    Attends Religious Services: More than 4 times per year    Active Member of Clubs or Organizations: Yes    Attends Banker Meetings: More than 4 times per year    Marital Status: Married  Depression (PHQ2-9): Low Risk (08/12/2023)   Depression (PHQ2-9)    PHQ-2 Score: 0  Alcohol Screen: Low Risk (08/12/2023)   Alcohol Screen    Last Alcohol Screening Score (AUDIT): 0  Housing: Low Risk (08/12/2023)   Housing Stability Vital Sign    Unable to Pay for Housing in the Last Year: No    Number of Times Moved in the Last Year: 0    Homeless in the Last Year: No  Utilities: Not At Risk (08/12/2023)   AHC Utilities    Threatened with loss of utilities: No  Health Literacy: Adequate Health Literacy (08/12/2023)   B1300 Health Literacy    Frequency of need for help with medical instructions: Never    Objective:  BP 122/82 (BP Location: Right Arm, Patient Position: Sitting)   Pulse 69   Temp 97.9 F (36.6 C) (Temporal)   Ht 5'  5 (1.651 m)   Wt 215 lb 12.8 oz (97.9 kg)   LMP  (LMP Unknown)   SpO2 96%   BMI 35.91 kg/m      03/09/2024    3:06 PM 12/03/2023   11:00 AM 12/03/2023    9:10 AM  BP/Weight  Systolic BP 122 112 118  Diastolic BP 82 68 60  Wt. (Lbs) 215.8    BMI 35.91 kg/m2      Physical Exam Vitals reviewed.  Constitutional:      Appearance: Normal appearance.  Cardiovascular:     Rate and Rhythm: Normal rate and regular rhythm.     Heart sounds: Normal heart sounds.  Pulmonary:     Effort: Pulmonary effort is normal.     Breath sounds: Normal breath sounds.  Abdominal:     General: Bowel sounds are normal.     Palpations: Abdomen is soft.     Tenderness: There is no abdominal tenderness.  Neurological:     Mental Status: She is alert and oriented to person, place, and time.  Psychiatric:        Mood and Affect: Mood normal.        Behavior: Behavior normal.      Lab Results  Component Value Date   WBC 9.8 11/14/2023   HGB 14.8 11/14/2023   HCT 45.5 11/14/2023   PLT 351 11/14/2023   GLUCOSE 109 (H) 11/14/2023   CHOL 180 11/14/2023   TRIG 188 (H) 11/14/2023   HDL 44 11/14/2023  LDLCALC 103 (H) 11/14/2023   ALT 14 11/14/2023   AST 18 11/14/2023   NA 141 11/14/2023   K 5.2 11/14/2023   CL 101 11/14/2023   CREATININE 0.97 11/14/2023   BUN 18 11/14/2023   CO2 29 11/14/2023   TSH 8.910 (H) 11/14/2023   HGBA1C 6.6 (H) 11/14/2023    Results for orders placed or performed in visit on 11/14/23  CBC with Differential/Platelet   Collection Time: 11/14/23  9:06 AM  Result Value Ref Range   WBC 9.8 3.4 - 10.8 x10E3/uL   RBC 4.94 3.77 - 5.28 x10E6/uL   Hemoglobin 14.8 11.1 - 15.9 g/dL   Hematocrit 54.4 65.9 - 46.6 %   MCV 92 79 - 97 fL   MCH 30.0 26.6 - 33.0 pg   MCHC 32.5 31.5 - 35.7 g/dL   RDW 87.3 88.2 - 84.5 %   Platelets 351 150 - 450 x10E3/uL   Neutrophils 43 Not Estab. %   Lymphs 43 Not Estab. %   Monocytes 7 Not Estab. %   Eos 4 Not Estab. %   Basos 2 Not  Estab. %   Neutrophils Absolute 4.3 1.4 - 7.0 x10E3/uL   Lymphocytes Absolute 4.2 (H) 0.7 - 3.1 x10E3/uL   Monocytes Absolute 0.7 0.1 - 0.9 x10E3/uL   EOS (ABSOLUTE) 0.4 0.0 - 0.4 x10E3/uL   Basophils Absolute 0.2 0.0 - 0.2 x10E3/uL   Immature Granulocytes 1 Not Estab. %   Immature Grans (Abs) 0.1 0.0 - 0.1 x10E3/uL  Comprehensive metabolic panel with GFR   Collection Time: 11/14/23  9:06 AM  Result Value Ref Range   Glucose 109 (H) 70 - 99 mg/dL   BUN 18 8 - 27 mg/dL   Creatinine, Ser 9.02 0.57 - 1.00 mg/dL   eGFR 64 >40 fO/fpw/8.26   BUN/Creatinine Ratio 19 12 - 28   Sodium 141 134 - 144 mmol/L   Potassium 5.2 3.5 - 5.2 mmol/L   Chloride 101 96 - 106 mmol/L   CO2 29 20 - 29 mmol/L   Calcium  9.6 8.7 - 10.3 mg/dL   Total Protein 6.4 6.0 - 8.5 g/dL   Albumin  4.0 3.9 - 4.9 g/dL   Globulin, Total 2.4 1.5 - 4.5 g/dL   Bilirubin Total 0.3 0.0 - 1.2 mg/dL   Alkaline Phosphatase 57 44 - 121 IU/L   AST 18 0 - 40 IU/L   ALT 14 0 - 32 IU/L  Hemoglobin A1c   Collection Time: 11/14/23  9:06 AM  Result Value Ref Range   Hgb A1c MFr Bld 6.6 (H) 4.8 - 5.6 %   Est. average glucose Bld gHb Est-mCnc 143 mg/dL  Lipid panel   Collection Time: 11/14/23  9:06 AM  Result Value Ref Range   Cholesterol, Total 180 100 - 199 mg/dL   Triglycerides 811 (H) 0 - 149 mg/dL   HDL 44 >60 mg/dL   VLDL Cholesterol Cal 33 5 - 40 mg/dL   LDL Chol Calc (NIH) 896 (H) 0 - 99 mg/dL   Chol/HDL Ratio 4.1 0.0 - 4.4 ratio  TSH   Collection Time: 11/14/23  9:06 AM  Result Value Ref Range   TSH 8.910 (H) 0.450 - 4.500 uIU/mL  VITAMIN D  25 Hydroxy (Vit-D Deficiency, Fractures)   Collection Time: 11/14/23  9:06 AM  Result Value Ref Range   Vit D, 25-Hydroxy 42.0 30.0 - 100.0 ng/mL  .  Assessment & Plan:   Assessment & Plan Secondary hypothyroidism Secondary hypothyroidism Recent adjustment by Dr.  Cox. No issues with current regimen. - Checked thyroid  function tests as part of blood work. Orders:   TSH;  Future  Mixed hyperlipidemia Continue to monitor diet and exercise Labs drawn for preop exam Orders:   Lipid panel; Future  Combined hyperlipidemia associated with type 2 diabetes mellitus (HCC) Preoperative evaluation for left hip arthroplasty Scheduled for left hip arthroplasty due to severe osteoarthritis. Current pain management insufficient. No cardiac history. Previous EKG and chest x-ray were performed. - Ordered labs including A1c, kidney, and liver function tests. - Performed EKG. - Clearance for surgery to be sent after blood work is completed. - Adjusted tramadol  dosing to two tablets in the morning, one at noon, and two in the evening. Orders:   Hemoglobin A1c; Future  Essential hypertension, benign Essential hypertension Hypertension reported. No new issues reported. Orders:   Comprehensive metabolic panel with GFR; Future   CBC with Differential/Platelet; Future  Chronic midline low back pain without sciatica Left hip osteoarthritis Severe osteoarthritis with significant pain impacting daily activities. Previous injections ineffective. Patient is ready for left hip replacement. Orders:   traMADol  (ULTRAM ) 50 MG tablet; Take two tablets in the morning, one tablet in the afternoon, and two in the evening.  Abnormal resting ECG findings ST segment changes in V3-V6 compared to previous Denies any new or worsening symptoms Continue to monitor Sent referral to cardiology for Lexiscan  Orders:   PCV MYOCARDIAL PERFUSION WITH LEXISCAN ; Future  Abnormal electrocardiogram ST segment changes in V3-V6 compared to previous Denies any new or worsening symptoms Continue to monitor Sent referral to cardiology for Lexiscan  Orders:   PCV MYOCARDIAL PERFUSION WITH LEXISCAN ; Future    Body mass index is 35.91 kg/m.   No orders of the defined types were placed in this encounter.   No orders of the defined types were placed in this encounter.      Follow-up: No  follow-ups on file.  An After Visit Summary was printed and given to the patient.    I,Lauren M Auman,acting as a neurosurgeon for [employer].,have documented all relevant documentation on the behalf of Nola Angles, PA,as directed by  Nola Angles, PA while in the presence of Nola Angles, [place].    Nola Angles, [place] [facility] [phone]     [1]  Current Outpatient Medications on File Prior to Visit  Medication Sig Dispense Refill   acetaminophen  (TYLENOL ) 500 MG tablet Take 2 tablets (1,000 mg total) by mouth every 6 (six) hours as needed for mild pain or moderate pain. 60 tablet 0   alendronate  (FOSAMAX ) 70 MG tablet TAKE 1 TABLET EVERY 7 DAYS ON AN EMPTY STOMACH WITH A FULL GLASS OF WATER  12 tablet 3   aspirin  EC 81 MG tablet Take 1 tablet (81 mg total) by mouth daily. 30 tablet 11   celecoxib  (CELEBREX ) 200 MG capsule TAKE 1 CAPSULE EVERY DAY 90 capsule 3   cetirizine (ZYRTEC) 10 MG tablet Take 10 mg by mouth daily.     fenofibrate  160 MG tablet TAKE 1 TABLET EVERY DAY 90 tablet 3   furosemide  (LASIX ) 40 MG tablet TAKE 1 TABLET EVERY DAY 90 tablet 3   gabapentin  (NEURONTIN ) 600 MG tablet TAKE 1 TABLET THREE TIMES DAILY 270 tablet 3   levothyroxine  (SYNTHROID ) 150 MCG tablet TAKE 1 TABLET EVERY DAY BEFORE BREAKFAST 90 tablet 3   methocarbamol  (ROBAXIN ) 500 MG tablet TAKE 1 TABLET THREE TIMES DAILY 270 tablet 3   metoprolol  tartrate (LOPRESSOR ) 25 MG tablet TAKE  1 TABLET TWICE DAILY 180 tablet 3   omeprazole  (PRILOSEC) 40 MG capsule TAKE 1 CAPSULE EVERY DAY 90 capsule 3   polyethylene glycol (MIRALAX  / GLYCOLAX ) 17 g packet Take 17 g by mouth daily as needed for moderate constipation.     traMADol  (ULTRAM ) 50 MG tablet Take 2 tablets (100 mg total) by mouth every 8 (eight) hours as needed for severe pain (pain score 7-10). 180 tablet 5   Vitamin D , Ergocalciferol , (DRISDOL ) 1.25 MG (50000 UNIT) CAPS capsule TAKE 1 CAPSULE EVERY 7 DAYS 12 capsule 3   No current  facility-administered medications on file prior to visit.   "

## 2024-03-09 NOTE — Assessment & Plan Note (Addendum)
 Essential hypertension Hypertension reported. No new issues reported. Orders:   Comprehensive metabolic panel with GFR; Future   CBC with Differential/Platelet; Future

## 2024-03-09 NOTE — Assessment & Plan Note (Addendum)
 Preoperative evaluation for left hip arthroplasty Scheduled for left hip arthroplasty due to severe osteoarthritis. Current pain management insufficient. No cardiac history. Previous EKG and chest x-ray were performed. - Ordered labs including A1c, kidney, and liver function tests. - Performed EKG. - Clearance for surgery to be sent after blood work is completed. - Adjusted tramadol  dosing to two tablets in the morning, one at noon, and two in the evening. Orders:   Hemoglobin A1c; Future

## 2024-03-09 NOTE — Assessment & Plan Note (Addendum)
 Secondary hypothyroidism Recent adjustment by Dr. Sherre. No issues with current regimen. - Checked thyroid  function tests as part of blood work. Orders:   TSH; Future

## 2024-03-09 NOTE — Assessment & Plan Note (Addendum)
 Left hip osteoarthritis Severe osteoarthritis with significant pain impacting daily activities. Previous injections ineffective. Patient is ready for left hip replacement. Orders:   traMADol  (ULTRAM ) 50 MG tablet; Take two tablets in the morning, one tablet in the afternoon, and two in the evening.

## 2024-03-10 LAB — CBC WITH DIFFERENTIAL/PLATELET
Basophils Absolute: 0.2 x10E3/uL (ref 0.0–0.2)
Basos: 2 %
EOS (ABSOLUTE): 0.4 x10E3/uL (ref 0.0–0.4)
Eos: 4 %
Hematocrit: 46.9 % — ABNORMAL HIGH (ref 34.0–46.6)
Hemoglobin: 15 g/dL (ref 11.1–15.9)
Immature Grans (Abs): 0.1 x10E3/uL (ref 0.0–0.1)
Immature Granulocytes: 1 %
Lymphocytes Absolute: 3.7 x10E3/uL — ABNORMAL HIGH (ref 0.7–3.1)
Lymphs: 37 %
MCH: 29.7 pg (ref 26.6–33.0)
MCHC: 32 g/dL (ref 31.5–35.7)
MCV: 93 fL (ref 79–97)
Monocytes Absolute: 0.8 x10E3/uL (ref 0.1–0.9)
Monocytes: 8 %
Neutrophils Absolute: 4.9 x10E3/uL (ref 1.4–7.0)
Neutrophils: 48 %
Platelets: 355 x10E3/uL (ref 150–450)
RBC: 5.05 x10E6/uL (ref 3.77–5.28)
RDW: 12.7 % (ref 11.7–15.4)
WBC: 10.1 x10E3/uL (ref 3.4–10.8)

## 2024-03-10 LAB — LIPID PANEL
Chol/HDL Ratio: 4.9 ratio — ABNORMAL HIGH (ref 0.0–4.4)
Cholesterol, Total: 185 mg/dL (ref 100–199)
HDL: 38 mg/dL — ABNORMAL LOW
LDL Chol Calc (NIH): 105 mg/dL — ABNORMAL HIGH (ref 0–99)
Triglycerides: 243 mg/dL — ABNORMAL HIGH (ref 0–149)
VLDL Cholesterol Cal: 42 mg/dL — ABNORMAL HIGH (ref 5–40)

## 2024-03-10 LAB — COMPREHENSIVE METABOLIC PANEL WITH GFR
ALT: 19 IU/L (ref 0–32)
AST: 25 IU/L (ref 0–40)
Albumin: 4.3 g/dL (ref 3.9–4.9)
Alkaline Phosphatase: 61 IU/L (ref 49–135)
BUN/Creatinine Ratio: 18 (ref 12–28)
BUN: 16 mg/dL (ref 8–27)
Bilirubin Total: 0.2 mg/dL (ref 0.0–1.2)
CO2: 28 mmol/L (ref 20–29)
Calcium: 10.1 mg/dL (ref 8.7–10.3)
Chloride: 101 mmol/L (ref 96–106)
Creatinine, Ser: 0.87 mg/dL (ref 0.57–1.00)
Globulin, Total: 2.3 g/dL (ref 1.5–4.5)
Glucose: 98 mg/dL (ref 70–99)
Potassium: 5.1 mmol/L (ref 3.5–5.2)
Sodium: 141 mmol/L (ref 134–144)
Total Protein: 6.6 g/dL (ref 6.0–8.5)
eGFR: 73 mL/min/1.73

## 2024-03-10 LAB — HEMOGLOBIN A1C
Est. average glucose Bld gHb Est-mCnc: 143 mg/dL
Hgb A1c MFr Bld: 6.6 % — ABNORMAL HIGH (ref 4.8–5.6)

## 2024-03-10 LAB — TSH: TSH: 1.56 u[IU]/mL (ref 0.450–4.500)

## 2024-03-11 ENCOUNTER — Ambulatory Visit: Payer: Self-pay | Admitting: Physician Assistant

## 2024-03-11 ENCOUNTER — Other Ambulatory Visit: Payer: Self-pay | Admitting: Physician Assistant

## 2024-03-11 DIAGNOSIS — Z9889 Other specified postprocedural states: Secondary | ICD-10-CM | POA: Insufficient documentation

## 2024-03-11 DIAGNOSIS — J302 Other seasonal allergic rhinitis: Secondary | ICD-10-CM | POA: Insufficient documentation

## 2024-03-11 DIAGNOSIS — H53469 Homonymous bilateral field defects, unspecified side: Secondary | ICD-10-CM | POA: Insufficient documentation

## 2024-03-11 DIAGNOSIS — C801 Malignant (primary) neoplasm, unspecified: Secondary | ICD-10-CM | POA: Insufficient documentation

## 2024-03-11 DIAGNOSIS — R9431 Abnormal electrocardiogram [ECG] [EKG]: Secondary | ICD-10-CM

## 2024-03-11 DIAGNOSIS — E063 Autoimmune thyroiditis: Secondary | ICD-10-CM | POA: Insufficient documentation

## 2024-03-11 DIAGNOSIS — G4730 Sleep apnea, unspecified: Secondary | ICD-10-CM | POA: Insufficient documentation

## 2024-03-15 ENCOUNTER — Ambulatory Visit

## 2024-03-15 VITALS — BP 134/88 | HR 76 | Ht 65.0 in | Wt 214.1 lb

## 2024-03-15 DIAGNOSIS — E1169 Type 2 diabetes mellitus with other specified complication: Secondary | ICD-10-CM

## 2024-03-15 DIAGNOSIS — Z01810 Encounter for preprocedural cardiovascular examination: Secondary | ICD-10-CM | POA: Diagnosis not present

## 2024-03-15 DIAGNOSIS — I10 Essential (primary) hypertension: Secondary | ICD-10-CM

## 2024-03-15 DIAGNOSIS — R0609 Other forms of dyspnea: Secondary | ICD-10-CM | POA: Diagnosis not present

## 2024-03-15 DIAGNOSIS — R9431 Abnormal electrocardiogram [ECG] [EKG]: Secondary | ICD-10-CM | POA: Insufficient documentation

## 2024-03-15 DIAGNOSIS — E782 Mixed hyperlipidemia: Secondary | ICD-10-CM

## 2024-03-15 MED ORDER — EZETIMIBE 10 MG PO TABS: 10.0000 mg | ORAL_TABLET | Freq: Every day | ORAL | 3 refills | Status: AC

## 2024-03-15 MED ORDER — METOPROLOL TARTRATE 100 MG PO TABS: 100.0000 mg | ORAL_TABLET | Freq: Once | ORAL | 0 refills | Status: AC

## 2024-03-15 NOTE — Progress Notes (Signed)
 "  Cardiology Consultation:    Date:  03/15/2024   ID:  Caitlin Ho, DOB Aug 08, 1955, MRN [id number]  PCP:  Sherre Clapper, MD  Cardiologist:  Alean SAUNDERS Eryanna Regal, MD   Referring MD: Sherre Clapper, MD   No chief complaint on file.    ASSESSMENT AND PLAN:   [name] 69 year old woman hypertension, hyperlipidemia, statin intolerance [myalgias], GERD, hypothyroidism, prediabetes, sleep apnea-not using CPAP, prior CVA [left eye peripheral vision loss on eye exam, subsequently confirmed on MRI of the brain], chronic bilateral lower extremity edema and on furosemide . No prior history of CAD, CHF, MI. Non-smoker, does not consume alcohol. Has had multiple back surgeries and right hip arthroplasty in the past most recent was in 2023.  Previous Lexiscan  stress test nuclear imaging December/2019 no ischemia. Echocardiogram December/2019 reported LVEF 55 to 60%, grade 1 diastolic dysfunction.   Here for further evaluation for preop assessment in the setting of abnormal EKG findings with inferolateral ST depression with T wave changes.  Problem List Items Addressed This Visit       Cardiovascular and Mediastinum   Essential hypertension, benign - Primary   Chronic hypertension, suboptimal today in the office. Associated with bilateral lower extremity edema which has been chronic. Using furosemide  40 mg once daily.  Target blood pressure below 130/80 mmHg. Reduce salt intake to less than 2 g/day. Continue current dose of Lasix  40 mg once daily. Will obtain transthoracic echocardiogram to rule out any significant cardiac structural and functional abnormalities and assess diastolic function. Continue home dose of metoprolol  tartrate 25 mg twice daily.       Relevant Medications   metoprolol  tartrate (LOPRESSOR ) 100 MG tablet   ezetimibe  (ZETIA ) 10 MG tablet   Other Relevant Orders   EKG 12-Lead (Completed)   ECHOCARDIOGRAM COMPLETE     Endocrine   Combined hyperlipidemia associated  with type 2 diabetes mellitus (HCC)   Suboptimal on recent lipid panel from 03/09/2024.  Was nonfasting blood work.  Optimize diabetes management. Reduce dietary fat intake. Continue fenofibrate  160 mg once daily. Given her history of CVA, target LDL less than 70 mg/dL. Statin intolerant due to myalgias in the past. Start Zetia  10 mg once daily. She is family with this medication as her husband takes this. Agreeable to start.        Relevant Medications   metoprolol  tartrate (LOPRESSOR ) 100 MG tablet   ezetimibe  (ZETIA ) 10 MG tablet     Other   Preop cardiovascular exam   Elective left hip arthroplasty TEE being considered.  Functional status limited due to hip pain. She also has nonspecific EKG changes, dyspnea on exertion and significant cardiovascular risk factors..  In this context further evaluate for CAD with cardiac CT coronary angiogram as reviewed above.  If no significant obstructive CAD should be okay to proceed with elective surgery as being planned. If there is any obstructive CAD identified on cardiac CT will require further evaluation and delay of her elective noncardiac surgery.       Relevant Medications   metoprolol  tartrate (LOPRESSOR ) 100 MG tablet   Other Relevant Orders   EKG 12-Lead (Completed)   CT CORONARY MORPH W/CTA COR W/SCORE W/CA W/CM &/OR WO/CM   ECHOCARDIOGRAM COMPLETE   Dyspnea on exertion   Shortness of breath chronic. Appears relatively stable. Could be related to deconditioning versus fluid overload in the setting of hypertensive heart disease versus anginal equivalent. Has nonspecific EKG changes with inferolateral ST depression with T wave inversions. Significant risk factors  for coronary artery disease.  Further evaluate with transthoracic echocardiogram Further evaluate with cardiac CT coronary angiogram to rule out obstructive coronary artery disease Stress test would be suboptimal given baseline EKG changes and hence we will  cancel the stress test ordered by PCP.  Continue aspirin  81 mg once daily.       Relevant Orders   CT CORONARY MORPH W/CTA COR W/SCORE W/CA W/CM &/OR WO/CM   ECHOCARDIOGRAM COMPLETE   Nonspecific abnormal electrocardiogram (ECG) (EKG)   Evaluation as discussed above under dyspnea on exertion. EKG changes with inferolateral ST depression T wave inversions appear subjectively chronic have been noted on prior EKGs from April 2022. However given her cardiovascular risk factors and no recent ischemic workup since December 2019, will further assess with cardiac CT coronary angiogram.       Relevant Medications   metoprolol  tartrate (LOPRESSOR ) 100 MG tablet   Other Relevant Orders   EKG 12-Lead (Completed)   CT CORONARY MORPH W/CTA COR W/SCORE W/CA W/CM &/OR WO/CM   ECHOCARDIOGRAM COMPLETE   Return to clinic tentatively in 6 weeks.  History of Present Illness:    Caitlin Ho is a 69 y.o. female who is being seen today for the evaluation of preop cardiovascular risk assessment for left hip replacement at the request of Cox, Kirsten, MD.   Here for the visit today accompanied by her husband.  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  Has been relatively sedentary over the last several weeks to months due to left hip pain.  History of hypertension, hyperlipidemia, statin intolerance [myalgias], GERD, hypothyroidism, prediabetes, sleep apnea-not using CPAP, prior CVA [left eye peripheral vision loss on eye exam, subsequently confirmed on MRI of the brain], chronic bilateral lower extremity edema and on furosemide . No prior history of CAD, CHF, MI. Non-smoker, does not consume alcohol.  Previous Lexiscan  stress test nuclear imaging December/2019 no ischemia. Echocardiogram December/2019 reported LVEF 55 to 60%, grade 1 diastolic dysfunction.  For preop assessment and baseline EKG abnormalities PCP ordered stress test with nuclear imaging, which required prior assessment with cardiology and  hence here for further discussion.  Lipid panel 03/09/2024 total cholesterol 185, triglycerides 243, HDL 38 and LDL 105. Normal complete metabolic panel and CBC unremarkable with hemoglobin 15 and hematocrit 46.9.  Absolute lymphocyte count mildly elevated 3.7 which appears to be a chronic and stable finding.  EKG in the clinic today shows sinus rhythm with heart rate 76/min, diffuse inferolateral ST depression with T wave inversion suspicious for ischemia.  However in comparison to prior ECG from 06/27/2020 had similar changes.  Here for the visit today. Mentions has chronic functional limitations due to hip pain worse recently. Denies any chest pain. Mentions short of breath with exertion which has been relatively chronic. Denies any palpitations, syncopal episodes or near syncopal episode. Has chronic bilateral lower extremity edema has been using Lasix  40 mg once daily.  Admits relatively poor diet recently due to difficulty preparing meals at home and ends up eating fast food.  Good compliance with her medications.  Past Medical History:  Diagnosis Date   Adrenal mass 11/27/2020   Cancer [facility])    cervical cancer cells whwn patient was 22    Cervicalgia    Chronic low back pain 03/11/2013   Class 2 severe obesity with body mass index (BMI) of 35 to 39.9 with serious comorbidity 05/24/2019   Combined hyperlipidemia associated with type 2 diabetes mellitus (HCC) 05/05/2019   Comorbid sleep-related hypoventilation 10/25/2018   Encounter  for osteoporosis screening in asymptomatic postmenopausal patient 09/25/2022   Encounter for screening mammogram for malignant neoplasm of breast 09/25/2022   Essential hypertension, benign 05/05/2019   Fibromyalgia    Gastroesophageal reflux disease 02/27/2022   Hashimoto's disease    Homonymous hemianopia    Lumbar radiculopathy 07/19/2013   Lumbar stenosis 02/16/2013   Need for pneumococcal 20-valent conjugate vaccination 09/25/2022   Occipital  cerebral infarction (HCC) 01/01/2018   Other sequelae of cerebral infarction    PONV (postoperative nausea and vomiting)    06/20/14 patient reported that BP dropped real low and she had N/V  and was slow to awaken when she had [facility].   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   LUMBAR FUSION  02/16/2013   Fusion lumbar.   THYROIDECTOMY  2006   TOTAL HIP ARTHROPLASTY Right 07/03/2021   Procedure: TOTAL HIP ARTHROPLASTY ANTERIOR APPROACH;  Surgeon: Beverley Evalene BIRCH, MD;  Location: [facility];  Service: Orthopedics;  Laterality: Right;   TUBAL LIGATION  1990    Current Medications: Active Medications[1]   Allergies:   Topamax  [topiramate ], Advil [ibuprofen], Lipitor [atorvastatin ], and Pravachol  [pravastatin ]   Social History   Socioeconomic History   Marital status: Married    Spouse name: Ubaldo   Number of children: 1   Years of education: Not on file   Highest education level: Not on file  Occupational History   Occupation: Disabled  Tobacco Use   Smoking status: Never   Smokeless tobacco: Never  Vaping Use   Vaping status: Never Used  Substance and Sexual Activity   Alcohol use: No   Drug use: No   Sexual activity: Yes    Partners: Male    Birth control/protection: None  Other Topics  Concern   Not on file  Social History Narrative   Not on file   Social Drivers of Health   Tobacco Use: Low Risk (03/15/2024)   Patient History    Smoking Tobacco Use: Never    Smokeless Tobacco Use: Never    Passive Exposure: Not on file  Financial Resource Strain: Low Risk (08/12/2023)   Overall Financial Resource Strain (CARDIA)    Difficulty of Paying Living Expenses: Not hard at all  Food Insecurity: No Food Insecurity (08/12/2023)   Hunger Vital Sign    Worried About Running Out of Food in the Last Year: Never true    Ran Out of Food in the Last Year: Never true  Transportation Needs: No Transportation Needs (08/12/2023)   PRAPARE - Administrator, Civil Service (Medical): No    Lack of Transportation (Non-Medical): No  Physical Activity: Inactive (08/12/2023)   Exercise  Vital Sign    Days of Exercise per Week: 0 days    Minutes of Exercise per Session: 0 min  Stress: No Stress Concern Present (08/12/2023)   [employer] of Occupational Health - Occupational Stress Questionnaire    Feeling of Stress : Not at all  Social Connections: Socially Integrated (08/12/2023)   Social Connection and Isolation Panel    Frequency of Communication with Friends and Family: More than three times a week    Frequency of Social Gatherings with Friends and Family: Once a week    Attends Religious Services: More than 4 times per year    Active Member of Clubs or Organizations: Yes    Attends Banker Meetings: More than 4 times per year    Marital Status: Married  Depression (PHQ2-9): Low Risk (08/12/2023)   Depression (PHQ2-9)    PHQ-2 Score: 0  Alcohol Screen: Low Risk (08/12/2023)   Alcohol Screen    Last Alcohol Screening Score (AUDIT): 0  Housing: Low Risk (08/12/2023)   Housing Stability Vital Sign    Unable to Pay for Housing in the Last Year: No    Number of Times Moved in the Last Year: 0    Homeless in the Last Year: No  Utilities: Not At Risk (08/12/2023)    AHC Utilities    Threatened with loss of utilities: No  Health Literacy: Adequate Health Literacy (08/12/2023)   B1300 Health Literacy    Frequency of need for help with medical instructions: Never     Family History: The patient's family history includes Aneurysm in her mother; Cancer in her paternal grandmother; Diabetes in [facility] other family member; Heart attack (age of onset: 78) in her father; Hyperlipidemia in [facility] other family member; Hypertension in [facility] other family member. ROS:   Please see the history of present illness.    All 14 point review of systems negative except as described per history of present illness.  EKGs/Labs/Other Studies Reviewed:    The following studies were reviewed today:   EKG:  EKG Interpretation Date/Time:  Monday March 15 2024 14:53:32 EST Ventricular Rate:  76 PR Interval:  148 QRS Duration:  76 QT Interval:  400 QTC Calculation: 450 R Axis:   78  Text Interpretation: Normal sinus rhythm Cannot rule out Anterior infarct , age undetermined ST & T wave abnormality, consider inferolateral ischemia Abnormal ECG When compared with ECG of 27-Jun-2020 15:04, QT has lengthened Confirmed by Liborio Hai reddy [phone]) on 03/15/2024 3:01:12 PM    Recent Labs: 03/09/2024: ALT 19; BUN 16; Creatinine, Ser 0.87; Hemoglobin 15.0; Platelets 355; Potassium 5.1; Sodium 141; TSH 1.560  Recent Lipid Panel    Component Value Date/Time   CHOL 185 03/09/2024 1601   TRIG 243 (H) 03/09/2024 1601   HDL 38 (L) 03/09/2024 1601   CHOLHDL 4.9 (H) 03/09/2024 1601   LDLCALC 105 (H) 03/09/2024 1601    Physical Exam:    VS:  BP 134/88   Pulse 76   Ht 5' 5 (1.651 m)   Wt 214 lb 2 oz (97.1 kg)   LMP  (LMP Unknown)   SpO2 92%   BMI 35.63 kg/m     Wt Readings from Last 3 Encounters:  03/15/24 214 lb 2 oz (97.1 kg)  03/09/24 215 lb 12.8 oz (97.9 kg)  12/03/23 212 lb (96.2 kg)     GENERAL:  Well nourished, well developed in no acute distress NECK: No JVD; No  carotid bruits CARDIAC: RRR, S1 and  S2 present, no murmurs, no rubs, no gallops CHEST:  Clear to auscultation without rales, wheezing or rhonchi  Extremities: Trace bilateral pitting pedal edema. Pulses bilaterally symmetric with radial 2+ and dorsalis pedis 2+ NEUROLOGIC:  Alert and oriented x 3  Medication Adjustments/Labs and Tests Ordered: Current medicines are reviewed at length with the patient today.  Concerns regarding medicines are outlined above.  Orders Placed This Encounter  Procedures   CT CORONARY MORPH W/CTA COR W/SCORE W/CA W/CM &/OR WO/CM   EKG 12-Lead   ECHOCARDIOGRAM COMPLETE   Meds ordered this encounter  Medications   metoprolol  tartrate (LOPRESSOR ) 100 MG tablet    Sig: Take 1 tablet (100 mg total) by mouth once. Take 2 hours prior to your CT if your heart rate is greater than 55    Dispense:  1 tablet    Refill:  0   ezetimibe  (ZETIA ) 10 MG tablet    Sig: Take 1 tablet (10 mg total) by mouth daily.    Dispense:  90 tablet    Refill:  3    Signed, Kess Mcilwain reddy Anatole Apollo, MD, MPH, [facility]. 03/15/2024 3:26 PM    [facility]     [1]  Current Meds  Medication Sig   alendronate  (FOSAMAX ) 70 MG tablet TAKE 1 TABLET EVERY 7 DAYS ON Ho EMPTY STOMACH WITH A FULL GLASS OF WATER    aspirin  EC 81 MG tablet Take 1 tablet (81 mg total) by mouth daily.   celecoxib  (CELEBREX ) 200 MG capsule TAKE 1 CAPSULE EVERY DAY   cetirizine (ZYRTEC) 10 MG tablet Take 10 mg by mouth daily.   ezetimibe  (ZETIA ) 10 MG tablet Take 1 tablet (10 mg total) by mouth daily.   fenofibrate  160 MG tablet TAKE 1 TABLET EVERY DAY   furosemide  (LASIX ) 40 MG tablet TAKE 1 TABLET EVERY DAY   gabapentin  (NEURONTIN ) 600 MG tablet TAKE 1 TABLET THREE TIMES DAILY   levothyroxine  (SYNTHROID ) 150 MCG tablet TAKE 1 TABLET EVERY DAY BEFORE BREAKFAST   methocarbamol  (ROBAXIN ) 500 MG tablet TAKE 1 TABLET THREE TIMES DAILY   metoprolol  tartrate (LOPRESSOR ) 100 MG tablet Take 1 tablet  (100 mg total) by mouth once. Take 2 hours prior to your CT if your heart rate is greater than 55   metoprolol  tartrate (LOPRESSOR ) 25 MG tablet TAKE 1 TABLET TWICE DAILY   omeprazole  (PRILOSEC) 40 MG capsule TAKE 1 CAPSULE EVERY DAY   polyethylene glycol (MIRALAX  / GLYCOLAX ) 17 g packet Take 17 g by mouth daily as needed for moderate constipation.   traMADol  (ULTRAM ) 50 MG tablet Take two tablets in the morning, one tablet in the afternoon, and two in the evening.   Vitamin D , Ergocalciferol , (DRISDOL ) 1.25 MG (50000 UNIT) CAPS capsule TAKE 1 CAPSULE EVERY 7 DAYS   [DISCONTINUED] acetaminophen  (TYLENOL ) 500 MG tablet Take 2 tablets (1,000 mg total) by mouth every 6 (six) hours as needed for mild pain or moderate pain.   "

## 2024-03-15 NOTE — Assessment & Plan Note (Signed)
 Shortness of breath chronic. Appears relatively stable. Could be related to deconditioning versus fluid overload in the setting of hypertensive heart disease versus anginal equivalent. Has nonspecific EKG changes with inferolateral ST depression with T wave inversions. Significant risk factors for coronary artery disease.  Further evaluate with transthoracic echocardiogram Further evaluate with cardiac CT coronary angiogram to rule out obstructive coronary artery disease Stress test would be suboptimal given baseline EKG changes and hence we will cancel the stress test ordered by PCP.  Continue aspirin  81 mg once daily.

## 2024-03-15 NOTE — Assessment & Plan Note (Signed)
 Chronic hypertension, suboptimal today in the office. Associated with bilateral lower extremity edema which has been chronic. Using furosemide  40 mg once daily.  Target blood pressure below 130/80 mmHg. Reduce salt intake to less than 2 g/day. Continue current dose of Lasix  40 mg once daily. Will obtain transthoracic echocardiogram to rule out any significant cardiac structural and functional abnormalities and assess diastolic function. Continue home dose of metoprolol  tartrate 25 mg twice daily.

## 2024-03-15 NOTE — Patient Instructions (Signed)
 Medication Instructions:  Your physician has recommended you make the following change in your medication:   Start Zetia  10 mg daily  *If you need a refill on your cardiac medications before your next appointment, please call your pharmacy*   Lab Work: None ordered If you have labs (blood work) drawn today and your tests are completely normal, you will receive your results only by: MyChart Message (if you have MyChart) OR A paper copy in the mail If you have any lab test that is abnormal or we need to change your treatment, we will call you to review the results.  Testing/Procedures: Your physician has requested that you have an echocardiogram. Echocardiography is a painless test that uses sound waves to create images of your heart. It provides your doctor with information about the size and shape of your heart and how well your hearts chambers and valves are working. This procedure takes approximately one hour. There are no restrictions for this procedure. Please do NOT wear cologne, perfume, aftershave, or lotions (deodorant is allowed). Please arrive 15 minutes prior to your appointment time.  Please note: We ask at that you not bring children with you during ultrasound (echo/ vascular) testing. Due to room size and safety concerns, children are not allowed in the ultrasound rooms during exams. Our front office staff cannot provide observation of children in our lobby area while testing is being conducted. An adult accompanying a patient to their appointment will only be allowed in the ultrasound room at the discretion of the ultrasound technician under special circumstances. We apologize for any inconvenience.    Your cardiac CT will be scheduled at one of the below locations:   [facility] [address] [place], [place] [phone]   Please follow these instructions carefully (unless otherwise directed):  An IV will be required for this test and Nitroglycerin  will be  given.   On the Night Before the Test: Be sure to Drink plenty of water . Do not consume any caffeinated/decaffeinated beverages or chocolate 12 hours prior to your test. Do not take any antihistamines 12 hours prior to your test.  On the Day of the Test: Drink plenty of water  until 1 hour prior to the test. Do not eat any food 1 hour prior to test. You may take your regular medications prior to the test.  Take metoprolol  (Lopressor ) 100 mg two hours prior to test.Hold your morning dose of Metoprolol . If you take Furosemide , please HOLD on the morning of the test. FEMALES- please wear underwire-free bra if available, avoid dresses & tight clothing       After the Test: Drink plenty of water . After receiving IV contrast, you may experience a mild flushed feeling. This is normal. On occasion, you may experience a mild rash up to 24 hours after the test. This is not dangerous. If this occurs, you can take Benadryl  25 mg, Zyrtec, Claritin , or Allegra and increase your fluid intake. (Patients taking Tikosyn should avoid Benadryl , and may take Zyrtec, Claritin , or Allegra) If you experience trouble breathing, this can be serious. If it is severe call 911 IMMEDIATELY. If it is mild, please call our office.  We will call to schedule your test 2-4 weeks out understanding that some insurance companies will need an authorization prior to the service being performed.   For more information and frequently asked questions, please visit our website : [email or web address]  For non-scheduling related questions, please contact the cardiac imaging nurse navigator should you have any questions/concerns:  Cardiac Imaging Nurse Navigators Direct Office Dial: [phone]   For scheduling needs, including cancellations and rescheduling, please call Brittany, [phone].  Follow-Up: At [facility], you and your health needs are our priority.  As part of our continuing mission to provide you  with exceptional heart care, we have created designated Provider Care Teams.  These Care Teams include your primary Cardiologist (physician) and Advanced Practice Providers (APPs -  Physician Assistants and Nurse Practitioners) who all work together to provide you with the care you need, when you need it.  We recommend signing up for the patient portal called MyChart.  Sign up information is provided on this After Visit Summary.  MyChart is used to connect with patients for Virtual Visits (Telemedicine).  Patients are able to view lab/test results, encounter notes, upcoming appointments, etc.  Non-urgent messages can be sent to your provider as well.   To learn more about what you can do with MyChart, go to [email or web address].    Your next appointment:   6 week(s)  The format for your next appointment:   In Person  Provider:   Alean Madireddy, MD   Other Instructions Echocardiogram An echocardiogram is a test that uses sound waves (ultrasound) to produce images of the heart. Images from an echocardiogram can provide important information about: Heart size and shape. The size and thickness and movement of your heart's walls. Heart muscle function and strength. Heart valve function or if you have stenosis. Stenosis is when the heart valves are too narrow. If blood is flowing backward through the heart valves (regurgitation). A tumor or infectious growth around the heart valves. Areas of heart muscle that are not working well because of poor blood flow or injury from a heart attack. Aneurysm detection. An aneurysm is a weak or damaged part of an artery wall. The wall bulges out from the normal force of blood pumping through the body. Tell a health care provider about: Any allergies you have. All medicines you are taking, including vitamins, herbs, eye drops, creams, and over-the-counter medicines. Any blood disorders you have. Any surgeries you have had. Any medical conditions  you have. Whether you are pregnant or may be pregnant. What are the risks? Generally, this is a safe test. However, problems may occur, including an allergic reaction to dye (contrast) that may be used during the test. What happens before the test? No specific preparation is needed. You may eat and drink normally. What happens during the test? You will take off your clothes from the waist up and put on a hospital gown. Electrodes or electrocardiogram (ECG)patches may be placed on your chest. The electrodes or patches are then connected to a device that monitors your heart rate and rhythm. You will lie down on a table for an ultrasound exam. A gel will be applied to your chest to help sound waves pass through your skin. A handheld device, called a transducer, will be pressed against your chest and moved over your heart. The transducer produces sound waves that travel to your heart and bounce back (or echo back) to the transducer. These sound waves will be captured in real-time and changed into images of your heart that can be viewed on a video monitor. The images will be recorded on a computer and reviewed by your health care provider. You may be asked to change positions or hold your breath for a short time. This makes it easier to get different views or better views of your heart. In some cases,  you may receive contrast through an IV in one of your veins. This can improve the quality of the pictures from your heart. The procedure may vary among health care providers and hospitals.   What can I expect after the test? You may return to your normal, everyday life, including diet, activities, and medicines, unless your health care provider tells you not to do that. Follow these instructions at home: It is up to you to get the results of your test. Ask your health care provider, or the department that is doing the test, when your results will be ready. Keep all follow-up visits. This is  important. Summary An echocardiogram is a test that uses sound waves (ultrasound) to produce images of the heart. Images from an echocardiogram can provide important information about the size and shape of your heart, heart muscle function, heart valve function, and other possible heart problems. You do not need to do anything to prepare before this test. You may eat and drink normally. After the echocardiogram is completed, you may return to your normal, everyday life, unless your health care provider tells you not to do that. This information is not intended to replace advice given to you by your health care provider. Make sure you discuss any questions you have with your health care provider. Document Revised: 10/12/2019 Document Reviewed: 10/12/2019 Elsevier Patient Education  2021 [employer]   Important Information About Sugar

## 2024-03-15 NOTE — Assessment & Plan Note (Signed)
 Evaluation as discussed above under dyspnea on exertion. EKG changes with inferolateral ST depression T wave inversions appear subjectively chronic have been noted on prior EKGs from April 2022. However given her cardiovascular risk factors and no recent ischemic workup since December 2019, will further assess with cardiac CT coronary angiogram.

## 2024-03-15 NOTE — Assessment & Plan Note (Signed)
 Elective left hip arthroplasty TEE being considered.  Functional status limited due to hip pain. She also has nonspecific EKG changes, dyspnea on exertion and significant cardiovascular risk factors..  In this context further evaluate for CAD with cardiac CT coronary angiogram as reviewed above.  If no significant obstructive CAD should be okay to proceed with elective surgery as being planned. If there is any obstructive CAD identified on cardiac CT will require further evaluation and delay of her elective noncardiac surgery.

## 2024-03-15 NOTE — Assessment & Plan Note (Signed)
 Suboptimal on recent lipid panel from 03/09/2024.  Was nonfasting blood work.  Optimize diabetes management. Reduce dietary fat intake. Continue fenofibrate  160 mg once daily. Given her history of CVA, target LDL less than 70 mg/dL. Statin intolerant due to myalgias in the past. Start Zetia  10 mg once daily. She is family with this medication as her husband takes this. Agreeable to start.

## 2024-03-22 ENCOUNTER — Ambulatory Visit: Admitting: Family Medicine

## 2024-04-07 ENCOUNTER — Ambulatory Visit

## 2024-04-07 ENCOUNTER — Other Ambulatory Visit: Payer: Self-pay | Admitting: Family Medicine

## 2024-04-08 ENCOUNTER — Ambulatory Visit

## 2024-04-08 DIAGNOSIS — R0609 Other forms of dyspnea: Secondary | ICD-10-CM

## 2024-04-08 DIAGNOSIS — R9431 Abnormal electrocardiogram [ECG] [EKG]: Secondary | ICD-10-CM

## 2024-04-08 DIAGNOSIS — I10 Essential (primary) hypertension: Secondary | ICD-10-CM

## 2024-04-08 DIAGNOSIS — Z01810 Encounter for preprocedural cardiovascular examination: Secondary | ICD-10-CM

## 2024-04-08 LAB — ECHOCARDIOGRAM COMPLETE
AR max vel: 1.63 cm2
AV Area VTI: 1.69 cm2
AV Area mean vel: 1.59 cm2
AV Mean grad: 3.3 mmHg
AV Peak grad: 6 mmHg
AV Vena cont: 0.3 cm
Ao pk vel: 1.22 m/s
Area-P 1/2: 3.11 cm2
MV M vel: 3.08 m/s
MV Peak grad: 37.9 mmHg
MV VTI: 1.48 cm2
MV Vena cont: 0.5 cm
P 1/2 time: 556 ms
S' Lateral: 2.8 cm

## 2024-04-26 ENCOUNTER — Ambulatory Visit
